# Patient Record
Sex: Male | Born: 1965
Health system: Southern US, Community
[De-identification: ages and names within clinical notes are randomized; demographics above are authoritative.]

## PROBLEM LIST (undated history)

## (undated) DIAGNOSIS — N4400 Torsion of testis, unspecified: Secondary | ICD-10-CM

## (undated) DIAGNOSIS — E669 Obesity, unspecified: Secondary | ICD-10-CM

## (undated) DIAGNOSIS — E11610 Type 2 diabetes mellitus with diabetic neuropathic arthropathy: Secondary | ICD-10-CM

## (undated) DIAGNOSIS — I10 Essential (primary) hypertension: Secondary | ICD-10-CM

## (undated) HISTORY — DX: Obesity, unspecified: E66.9

## (undated) HISTORY — DX: Essential (primary) hypertension: I10

## (undated) HISTORY — DX: Torsion of testis, unspecified: N44.00

## (undated) HISTORY — DX: Type 2 diabetes mellitus with diabetic neuropathic arthropathy: E11.610

---

## 2003-03-17 ENCOUNTER — Emergency Department (HOSPITAL_COMMUNITY): Admission: AD | Admit: 2003-03-17 | Discharge: 2003-03-17 | Payer: Self-pay | Admitting: Family Medicine

## 2004-02-12 ENCOUNTER — Ambulatory Visit: Payer: Self-pay | Admitting: Family Medicine

## 2005-01-02 ENCOUNTER — Ambulatory Visit: Payer: Self-pay | Admitting: Internal Medicine

## 2014-06-06 ENCOUNTER — Emergency Department (INDEPENDENT_AMBULATORY_CARE_PROVIDER_SITE_OTHER): Payer: BLUE CROSS/BLUE SHIELD

## 2014-06-06 ENCOUNTER — Emergency Department (HOSPITAL_COMMUNITY)
Admission: EM | Admit: 2014-06-06 | Discharge: 2014-06-06 | Disposition: A | Discharge status: Home / Self Care | Payer: BLUE CROSS/BLUE SHIELD | Source: Home / Self Care | Attending: Family Medicine | Admitting: Family Medicine

## 2014-06-06 ENCOUNTER — Encounter (HOSPITAL_COMMUNITY): Payer: Self-pay | Admitting: *Deleted

## 2014-06-06 DIAGNOSIS — R141 Gas pain: Secondary | ICD-10-CM

## 2014-06-06 DIAGNOSIS — M419 Scoliosis, unspecified: Secondary | ICD-10-CM

## 2014-06-06 DIAGNOSIS — M5136 Other intervertebral disc degeneration, lumbar region: Secondary | ICD-10-CM

## 2014-06-06 DIAGNOSIS — K5901 Slow transit constipation: Secondary | ICD-10-CM

## 2014-06-06 NOTE — ED Provider Notes (Signed)
CSN: [id number]     Arrival date & time 06/06/14  1259 History   First MD Initiated Contact with Patient 06/06/14 1400     Chief Complaint  Patient presents with  . Abdominal Pain   (Consider location/radiation/quality/duration/timing/severity/associated sxs/prior Treatment) HPI Comments: Nursing note reviewed. Same history obtained by the patient. That approximately 2 weeks ago he was lifting a heavy object and he also coughed in experience some pain to the right upper quadrant. Since that time his wife noticed that there was a swelling to the right upper quadrant. He did not notice it initially. He has minimal discomfort within this area. He has little to no pain. 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 He denies midabdominal pain, nausea, vomiting, decrease in appetite, constipation, diarrhea, blood in the stool. He has not felt ill or experienced fever. He apparently saw a physician associated with the company and was told it may be hernia according to the patient.   History reviewed. No pertinent past medical history. History reviewed. No pertinent past surgical history. History reviewed. No pertinent family history. History  Substance Use Topics  . Smoking status: Never Smoker   . Smokeless tobacco: Not on file  . Alcohol Use: No    Review of Systems  Constitutional: Negative for fever, activity change, appetite change and fatigue.  HENT: Negative.   Respiratory: Negative for cough, shortness of breath and wheezing.        No pain with deep breathing.  Cardiovascular: Negative for chest pain and palpitations.  Gastrointestinal: Negative for nausea, vomiting, abdominal pain, diarrhea, constipation, blood in stool, abdominal distention and rectal pain.  Genitourinary: Negative.   Skin: Negative.   Neurological: Negative.   Psychiatric/Behavioral: Negative.     Allergies  Ceclor  Home Medications   Prior to  Admission medications   Not on File   BP 176/93 mmHg  Pulse 88  Temp(Src) 98.7 F (37.1 C) (Oral)  Resp 16  SpO2 100% Physical Exam  Constitutional: He is oriented to person, place, and time. He appears well-developed and well-nourished. No distress.  Eyes: EOM are normal.  Neck: Normal range of motion. Neck supple.  Cardiovascular: Normal rate, regular rhythm and normal heart sounds.   Pulmonary/Chest: Effort normal and breath sounds normal. He has no wheezes. He has no rales.  Abdominal: Soft. Bowel sounds are normal. He exhibits mass. He exhibits no distension, no pulsatile liver, no fluid wave and no abdominal bruit. There is no hepatosplenomegaly. There is no tenderness. There is no rigidity, no rebound, no guarding, no tenderness at McBurney's point and negative Murphy's sign. No hernia.    There is a 16 x 8 cm area of what appears to be a fatty prominence lying partially over the right anterior costal margin and right upper quadrant of the abdomen. There is no tenderness. It is slightly firm just enough so that it can be delineated from the soft abdomen. It is nonmobile. It does not reduce in such a way hernia may be expected to. No palpable muscular wall ring. Palpation of the abdomen reveals no tenderness or masses, rebound or guarding. There is no overlying discoloration, ecchymosis. No tenderness to deep palpation and percussion to the chest wall, ribs or costal margin. Having the patient flex at the waist laterally produces very minor discomfort only in such a way that is " barely noticeable "according to the patient.  Neurological: He is alert and oriented to person,  place, and time. He exhibits normal muscle tone.  Skin: Skin is warm and dry. No rash noted. No erythema.  Psychiatric: He has a normal mood and affect.  Nursing note and vitals reviewed.   ED Course  Procedures (including critical care time) Labs Review Labs Reviewed - No data to display  Imaging Review Dg  Abd 2 Views  06/06/2014   CLINICAL DATA:  Lifting injury.  EXAM: ABDOMEN - 2 VIEW  COMPARISON:  None.  FINDINGS: Soft tissue structures are unremarkable. Gas pattern is nonspecific. Stool noted throughout colon. Severe degenerative changes lumbar spinal scoliosis concave left. Right posterior ninth rib fracture noted. This appears old. No displaced acute fracture noted.  IMPRESSION: 1. Moderate amount of stool noted throughout colon. No bowel distention.  2. Severe degenerative changes and scoliosis lumbar spine. Old right posterior ninth rib fracture.   Electronically Signed   By: Maisie Fushomas  Register   On: 06/06/2014 16:02     MDM   1. Constipation due to slow transit   2. Abdominal gas pain   3. Lumbar degenerative disc disease   4. Scoliosis deformity of spine    No hernias seen. The x-ray is strongly suggest that this bulges due to heavy stool burden to the ascending colon positioned laterally causing the apparent bulge. Much stool in the colon and rectum. Miralax 1 and 1/2 cap per 8 oz fluid every 1-2 hours times 2-3 glasses. Dulcolax 1 tablet Fleets enema to empty rectum. There is also lumbar degenerative disc disease for which she needs to follow-up with his PCP. Call tomorrow for an appointment.    Hayden Rasmussenavid Tyreanna Bisesi, NP 06/06/14 [phone]

## 2014-06-06 NOTE — ED Notes (Addendum)
Pt  alledges he   Sustained  An  Injury  At  Work   For  [employer]  While  Lifting approx  2  [employer]eks  Ago      He  Reports  Pain and  Swelling    Of  His  r  Side      He  denys  Any  Vomiting    He  States  He  Notified  His  Employer    And   Was seen  At  An  Urgent  Care  [address]  He  Does  Not  Recall  The  Name  And  He  Brought  No papers  With  Him    He  States  He  Was  Told  He  May  Have  A  Hernia    According  To  Him   -   The  Pt  Is  Sanding  Upright in  Room  Appearing  In no  Severe   Distress        Skin is  Warm  And  Dry  He  Is  Alert and  Oriented  Pt  States  He  Wants  A  Second  opinion

## 2014-06-06 NOTE — Discharge Instructions (Signed)
Constipation Miralax 1 and 1/2 cap per 8 oz fluid every 1-2 hours times 2-3 glasses. Dulcolax 1 tablet Fleets enema to empty colon.  Constipation is when a person has fewer than three bowel movements a week, has difficulty having a bowel movement, or has stools that are dry, hard, or larger than normal. As people grow older, constipation is more common. If you try to fix constipation with medicines that make you have a bowel movement (laxatives), the problem may get worse. Long-term laxative use may cause the muscles of the colon to become weak. A low-fiber diet, not taking in enough fluids, and taking certain medicines may make constipation worse.  CAUSES   Certain medicines, such as antidepressants, pain medicine, iron supplements, antacids, and water pills.   Certain diseases, such as diabetes, irritable bowel syndrome (IBS), thyroid disease, or depression.   Not drinking enough water.   Not eating enough fiber-rich foods.   Stress or travel.   Lack of physical activity or exercise.   Ignoring the urge to have a bowel movement.   Using laxatives too much.  SIGNS AND SYMPTOMS   Having fewer than three bowel movements a week.   Straining to have a bowel movement.   Having stools that are hard, dry, or larger than normal.   Feeling full or bloated.   Pain in the lower abdomen.   Not feeling relief after having a bowel movement.  DIAGNOSIS  Your health care provider will take a medical history and perform a physical exam. Further testing may be done for severe constipation. Some tests may include:  A barium enema X-ray to examine your rectum, colon, and, sometimes, your small intestine.   A sigmoidoscopy to examine your lower colon.   A colonoscopy to examine your entire colon. TREATMENT  Treatment will depend on the severity of your constipation and what is causing it. Some dietary treatments include drinking more fluids and eating more fiber-rich foods.  Lifestyle treatments may include regular exercise. If these diet and lifestyle recommendations do not help, your health care provider may recommend taking over-the-counter laxative medicines to help you have bowel movements. Prescription medicines may be prescribed if over-the-counter medicines do not work.  HOME CARE INSTRUCTIONS   Eat foods that have a lot of fiber, such as fruits, vegetables, whole grains, and beans.  Limit foods high in fat and processed sugars, such as french fries, hamburgers, cookies, candies, and soda.   A fiber supplement may be added to your diet if you cannot get enough fiber from foods.   Drink enough fluids to keep your urine clear or pale yellow.   Exercise regularly or as directed by your health care provider.   Go to the restroom when you have the urge to go. Do not hold it.   Only take over-the-counter or prescription medicines as directed by your health care provider. Do not take other medicines for constipation without talking to your health care provider first.  SEEK IMMEDIATE MEDICAL CARE IF:   You have bright red blood in your stool.   Your constipation lasts for more than 4 days or gets worse.   You have abdominal or rectal pain.   You have thin, pencil-like stools.   You have unexplained weight loss. MAKE SURE YOU:   Understand these instructions.  Will watch your condition.  Will get help right away if you are not doing well or get worse. Document Released: 10/04/2003 Document Revised: 01/10/2013 Document Reviewed: 10/17/2012 ExitCare Patient Information 2015  [employer]. This information is not intended to replace advice given to you by your health care provider. Make sure you discuss any questions you have with your health care provider.  Degenerative Disk Disease Degenerative disk disease is a condition caused by the changes that occur in the cushions of the backbone (spinal disks) as you grow older. Spinal disks are  soft and compressible disks located between the bones of the spine (vertebrae). They act like shock absorbers. Degenerative disk disease can affect the whole spine. However, the neck and lower back are most commonly affected. Many changes can occur in the spinal disks with aging, such as:  The spinal disks may dry and shrink.  Small tears may occur in the tough, outer covering of the disk (annulus).  The disk space may become smaller due to loss of water.  Abnormal growths in the bone (spurs) may occur. This can put pressure on the nerve roots exiting the spinal canal, causing pain.  The spinal canal may become narrowed. CAUSES  Degenerative disk disease is a condition caused by the changes that occur in the spinal disks with aging. The exact cause is not known, but there is a genetic basis for many patients. Degenerative changes can occur due to loss of fluid in the disk. This makes the disk thinner and reduces the space between the backbones. Small cracks can develop in the outer layer of the disk. This can lead to the breakdown of the disk. You are more likely to get degenerative disk disease if you are overweight. Smoking cigarettes and doing heavy work such as weightlifting can also increase your risk of this condition. Degenerative changes can start after a sudden injury. Growth of bone spurs can compress the nerve roots and cause pain.  SYMPTOMS  The symptoms vary from person to person. Some people may have no pain, while others have severe pain. The pain may be so severe that it can limit your activities. The location of the pain depends on the part of your backbone that is affected. You will have neck or arm pain if a disk in the neck area is affected. You will have pain in your back, buttocks, or legs if a disk in the lower back is affected. The pain becomes worse while bending, reaching up, or with twisting movements. The pain may start gradually and then get worse as time passes. It may  also start after a major or minor injury. You may feel numbness or tingling in the arms or legs.  DIAGNOSIS  Your caregiver will ask you about your symptoms and about activities or habits that may cause the pain. He or she may also ask about any injuries, diseases, or treatments you have had earlier. Your caregiver will examine you to check for the range of movement that is possible in the affected area, to check for strength in your extremities, and to check for sensation in the areas of the arms and legs supplied by different nerve roots. An X-ray of the spine may be taken. Your caregiver may suggest other imaging tests, such as magnetic resonance imaging (MRI), if needed.  TREATMENT  Treatment includes rest, modifying your activities, and applying ice and heat. Your caregiver may prescribe medicines to reduce your pain and may ask you to do some exercises to strengthen your back. In some cases, you may need surgery. You and your caregiver will decide on the treatment that is best for you. HOME CARE INSTRUCTIONS   Follow proper lifting  and walking techniques as advised by your caregiver.  Maintain good posture.  Exercise regularly as advised.  Perform relaxation exercises.  Change your sitting, standing, and sleeping habits as advised. Change positions frequently.  Lose weight as advised.  Stop smoking if you smoke.  Wear supportive footwear. SEEK MEDICAL CARE IF:  Your pain does not go away within 1 to 4 weeks. SEEK IMMEDIATE MEDICAL CARE IF:   Your pain is severe.  You notice weakness in your arms, hands, or legs.  You begin to lose control of your bladder or bowel movements. MAKE SURE YOU:   Understand these instructions.  Will watch your condition.  Will get help right away if you are not doing well or get worse. Document Released: 11/02/2006 Document Revised: 03/30/2011 Document Reviewed: 05/09/2013 [facility] Patient Information 2015 [place], [place]. This information is  not intended to replace advice given to you by your health care provider. Make sure you discuss any questions you have with your health care provider.  Scoliosis Scoliosis is the name given to a spine that curves sideways.Scoliosis can cause twisting of your shoulders, hips, chest, back, and rib cage.  CAUSES  The cause of scoliosis is not always known. It may be caused by a birth defect or by a disease that can cause muscular dysfunction and imbalance, such as cerebral palsy and muscular dystrophy.  RISK FACTORS Having a disease that causes muscle disease or dysfunction. SIGNS AND SYMPTOMS Scoliosis often has no signs or symptoms.If they are present, they may include:  Unequal size of one body side compared to the other (asymmetry).  Visible curvature of the spine.  Pain. The pain may limit physical activity.  Shortness of breath.  Bowel or bladder issues. DIAGNOSIS A skilled health care provider will perform an evaluation. This will involve:  Taking your history.  Performing a physical examination.  Performing a neurological exam to detect nerve or muscle function loss.  Range of motion studies on the spine.  X-rays. An MRI may also be obtained. TREATMENT  Treatment varies depending on the nature, extent, and severity of the disease. If the curvature is not great, you may need only observation. A brace may be used to prevent scoliosis from progressing. A brace may also be needed during growth spurts. Physical therapy may be of benefit. Surgery may be required.  HOME CARE INSTRUCTIONS   Your health care provider may suggest exercises to strengthen your muscles. Perform them as directed.  Ask your health care provider before participating in any sports.   If you have been prescribed an orthopedic brace, wear it as instructed by your health care provider. SEEK MEDICAL CARE IF: Your brace causes the skin to become sore (chafe) or is uncomfortable.  SEEK IMMEDIATE MEDICAL  CARE IF:  You have back pain that is not relieved by the medicines prescribed by your health care provider.   Your legs feel weak or you lose function in your legs.  You lose some bowel or bladder control.  Document Released: 01/03/2000 Document Revised: 01/10/2013 Document Reviewed: 09/12/2012 [facility] Patient Information 2015 [place], [place]. This information is not intended to replace advice given to you by your health care provider. Make sure you discuss any questions you have with your health care provider.

## 2014-06-12 ENCOUNTER — Ambulatory Visit (INDEPENDENT_AMBULATORY_CARE_PROVIDER_SITE_OTHER): Payer: BLUE CROSS/BLUE SHIELD | Admitting: Primary Care

## 2014-06-12 ENCOUNTER — Telehealth: Payer: Self-pay | Admitting: Primary Care

## 2014-06-12 ENCOUNTER — Encounter: Payer: Self-pay | Admitting: Primary Care

## 2014-06-12 VITALS — BP 154/106 | HR 83 | Temp 97.7°F | Ht 74.0 in | Wt 307.4 lb

## 2014-06-12 DIAGNOSIS — E118 Type 2 diabetes mellitus with unspecified complications: Secondary | ICD-10-CM

## 2014-06-12 DIAGNOSIS — R200 Anesthesia of skin: Secondary | ICD-10-CM

## 2014-06-12 DIAGNOSIS — E669 Obesity, unspecified: Secondary | ICD-10-CM | POA: Diagnosis not present

## 2014-06-12 DIAGNOSIS — I10 Essential (primary) hypertension: Secondary | ICD-10-CM | POA: Diagnosis not present

## 2014-06-12 DIAGNOSIS — R208 Other disturbances of skin sensation: Secondary | ICD-10-CM

## 2014-06-12 LAB — COMPREHENSIVE METABOLIC PANEL
ALT: 53 U/L (ref 0–53)
AST: 23 U/L (ref 0–37)
Albumin: 4.2 g/dL (ref 3.5–5.2)
Alkaline Phosphatase: 71 U/L (ref 39–117)
BUN: 23 mg/dL (ref 6–23)
CO2: 28 meq/L (ref 19–32)
Calcium: 9.4 mg/dL (ref 8.4–10.5)
Chloride: 97 mEq/L (ref 96–112)
Creatinine, Ser: 0.93 mg/dL (ref 0.40–1.50)
GFR: 92.06 mL/min (ref >60.00)
Glucose, Bld: 378 mg/dL — ABNORMAL HIGH (ref 70–99)
POTASSIUM: 4.6 meq/L (ref 3.5–5.1)
Sodium: 132 mEq/L — ABNORMAL LOW (ref 135–145)
TOTAL PROTEIN: 7.1 g/dL (ref 6.0–8.3)
Total Bilirubin: 0.8 mg/dL (ref 0.2–1.2)

## 2014-06-12 LAB — HEMOGLOBIN A1C: HEMOGLOBIN A1C: 11.3 % — AB (ref 4.6–6.5)

## 2014-06-12 MED ORDER — LISINOPRIL 10 MG PO TABS: 10.0000 mg | ORAL_TABLET | Freq: Every day | ORAL | Status: DC

## 2014-06-12 MED ORDER — METFORMIN HCL 500 MG PO TABS: 500.0000 mg | ORAL_TABLET | Freq: Two times a day (BID) | ORAL | Status: DC

## 2014-06-12 NOTE — Progress Notes (Signed)
Subjective:    Patient ID: Kenneth Cohen, male    DOB: [birth year], 49 y.o.   MRN: [id number]  HPI  Kenneth Cohen is a 49 year old male who presents today to establish care and discuss the problems mentioned below. Will review records from Urgent Care visit last week.  1) Elevated blood pressure: History of elevated readings 10 years ago when eating a poor diet consisting of fast food and also smoking. He was placed on medication which he was able to come off of due to improvement in his blood pressure. His blood pressure was 176/93 at Urgent Care last week.   BP Readings from Last 3 Encounters:  06/12/14 154/106  06/06/14 176/93     2) Constipation: He was evaluated on 06/06/14 at Urgent Care near [facility] for RUQ abdominal pain and was determined to have a moderate amount of stool in his colon. He's been taking ducolax, Mirilax, and had enema with consistent bowel movements since.  3) Degenerative Spine Disease/Numbness: Noted on abdominal xray obtained last week. History of heavy lifting at prior occupation which caused back pain. Denies low back pain today but will have some "sharp, shooting" pain intermittently down right leg. Reports numbness to bilateral feet for several months. He noticed bleeding on his kitchen floor last week and realized later he was bleeding as he'd stepped on some glass.  4) Obesity: Diet consists of hamburgers, steaks, pork chops, sandwiches, some fast food. Drinks mostly diet pepsi, water, crystal light, some sweet tea.  He tries to watch the salt content of his foods. He does not exercise regularly.   Review of Systems  Constitutional: Negative for fatigue and unexpected weight change.  HENT: Negative for rhinorrhea.   Eyes: Negative for visual disturbance.  Respiratory: Negative for cough and shortness of breath.   Cardiovascular: Negative for chest pain.  Gastrointestinal: Positive for constipation. Negative for diarrhea.  Genitourinary: Negative for  dysuria and difficulty urinating.  Musculoskeletal: Positive for arthralgias. Negative for myalgias.  Skin: Negative for rash.  Allergic/Immunologic:       Occasoinal seasonal allergies. Once had allergy injections.  Neurological: Positive for numbness. Negative for dizziness and headaches.  Psychiatric/Behavioral:       Denies concerns for anxiety or depression.       Past Medical History  Diagnosis Date  . Hypertension   . Obesity   . Testicular torsion     as a child    History   Social History  . Marital Status: Married    Spouse Name: N/A  . Number of Children: N/A  . Years of Education: N/A   Occupational History  . Not on file.   Social History Main Topics  . Smoking status: Former Smoker    Quit date: 01/20/2004  . Smokeless tobacco: Not on file  . Alcohol Use: 0.0 oz/week    0 Standard drinks or equivalent per week     Comment: rarely  . Drug Use: No  . Sexual Activity: Not on file   Other Topics Concern  . Not on file   Social History Narrative   Married.   One son, junior in [employer].   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   Enjoys playing on the computer.    No past surgical history on file.  Family History  Problem Relation Age of Onset  . Hypertension Mother   . Diabetes Father   . Diabetes Mother     Allergies  Allergen Reactions  .  Ceclor [Cefaclor] Swelling    No current outpatient prescriptions on file prior to visit.   No current facility-administered medications on file prior to visit.    BP 154/106 mmHg  Pulse 83  Temp(Src) 97.7 F (36.5 C) (Oral)  Ht 6\' 2"  (1.88 m)  Wt 307 lb 6.4 oz (139.436 kg)  BMI 39.45 kg/m2  SpO2 97%     Objective:   Physical Exam  Constitutional: He is oriented to person, place, and time. He appears well-nourished.  HENT:  Right Ear: Tympanic membrane and ear canal normal.  Left Ear: Tympanic membrane and ear canal normal.  Nose: Nose normal.  Mouth/Throat: Oropharynx is clear and  moist.  Eyes: Conjunctivae and EOM are normal. Pupils are equal, round, and reactive to light.  Neck: Neck supple.  Cardiovascular: Normal rate and regular rhythm.   Pulmonary/Chest: Effort normal and breath sounds normal.  Abdominal: Soft. Bowel sounds are normal. He exhibits no mass. There is no tenderness.  Neurological: He is alert and oriented to person, place, and time. No cranial nerve deficit.  Skin: Skin is warm and dry.  Psychiatric: He has a normal mood and affect.          Assessment & Plan:

## 2014-06-12 NOTE — Assessment & Plan Note (Signed)
Poor diet. No exercise.  Also with numbness to bilateral feet.  Will check A1C, CMP today to evaluate for diabetes as I suspect this is probable.

## 2014-06-12 NOTE — Patient Instructions (Signed)
Start Lisinopril tablets for blood pressure. Take 1 tablet by mouth daily for high blood pressure. Start checking your blood pressure daily if possible. Complete lab work prior to leaving today. I will notify you of your results. It is important that you improve your diet. Please limit carbohydrates in the form of white bread, rice, pasta, cakes, cookies, sugary drinks, etc. Increase your consumption of fresh fruits and vegetables. Be sure to drink plenty of water daily. Follow up in 2 weeks for evaluation of blood pressure. It was a pleasure to meet you today! Please don't hesitate to call me with any questions. Welcome to [facility]!  Hypertension Hypertension, commonly called high blood pressure, is when the force of blood pumping through your arteries is too strong. Your arteries are the blood vessels that carry blood from your heart throughout your body. A blood pressure reading consists of a higher number over a lower number, such as 110/72. The higher number (systolic) is the pressure inside your arteries when your heart pumps. The lower number (diastolic) is the pressure inside your arteries when your heart relaxes. Ideally you want your blood pressure below 120/80. Hypertension forces your heart to work harder to pump blood. Your arteries may become narrow or stiff. Having hypertension puts you at risk for heart disease, stroke, and other problems.  RISK FACTORS Some risk factors for high blood pressure are controllable. Others are not.  Risk factors you cannot control include:   Race. You may be at higher risk if you are African American.  Age. Risk increases with age.  Gender. Men are at higher risk than women before age 48 years. After age 48, women are at higher risk than men. Risk factors you can control include:  Not getting enough exercise or physical activity.  Being overweight.  Getting too much fat, sugar, calories, or salt in your diet.  Drinking too much alcohol. SIGNS  AND SYMPTOMS Hypertension does not usually cause signs or symptoms. Extremely high blood pressure (hypertensive crisis) may cause headache, anxiety, shortness of breath, and nosebleed. DIAGNOSIS  To check if you have hypertension, your health care provider will measure your blood pressure while you are seated, with your arm held at the level of your heart. It should be measured at least twice using the same arm. Certain conditions can cause a difference in blood pressure between your right and left arms. A blood pressure reading that is higher than normal on one occasion does not mean that you need treatment. If one blood pressure reading is high, ask your health care provider about having it checked again. TREATMENT  Treating high blood pressure includes making lifestyle changes and possibly taking medicine. Living a healthy lifestyle can help lower high blood pressure. You may need to change some of your habits. Lifestyle changes may include:  Following the DASH diet. This diet is high in fruits, vegetables, and whole grains. It is low in salt, red meat, and added sugars.  Getting at least 2 hours of brisk physical activity every week.  Losing weight if necessary.  Not smoking.  Limiting alcoholic beverages.  Learning ways to reduce stress. If lifestyle changes are not enough to get your blood pressure under control, your health care provider may prescribe medicine. You may need to take more than one. Work closely with your health care provider to understand the risks and benefits. HOME CARE INSTRUCTIONS  Have your blood pressure rechecked as directed by your health care provider.   Take medicines only as  directed by your health care provider. Follow the directions carefully. Blood pressure medicines must be taken as prescribed. The medicine does not work as well when you skip doses. Skipping doses also puts you at risk for problems.   Do not smoke.   Monitor your blood pressure at  home as directed by your health care provider. SEEK MEDICAL CARE IF:   You think you are having a reaction to medicines taken.  You have recurrent headaches or feel dizzy.  You have swelling in your ankles.  You have trouble with your vision. SEEK IMMEDIATE MEDICAL CARE IF:  You develop a severe headache or confusion.  You have unusual weakness, numbness, or feel faint.  You have severe chest or abdominal pain.  You vomit repeatedly.  You have trouble breathing. MAKE SURE YOU:   Understand these instructions.  Will watch your condition.  Will get help right away if you are not doing well or get worse. Document Released: 01/05/2005 Document Revised: 05/22/2013 Document Reviewed: 10/28/2012 [facility] Patient Information 2015 [place], [place]. This information is not intended to replace advice given to you by your health care provider. Make sure you discuss any questions you have with your health care provider.

## 2014-06-12 NOTE — Progress Notes (Signed)
Pre visit review using our clinic review tool, if applicable. No additional management support is needed unless otherwise documented below in the visit note. 

## 2014-06-12 NOTE — Assessment & Plan Note (Signed)
Numbness to bilateral feet for several months. Suspected diabetes due to body habitus, poor diet, and numbness to feet. A1C of 11. Will start metformin BID. Patient started on ACE this morning for HTN. Follow up in 2 weeks for HTN, 3 months for diabetes.

## 2014-06-12 NOTE — Telephone Encounter (Signed)
A1C of 11.3. Spoke with patient regarding result and ordered metformin BID. Patient is to follow up with me in 2 weeks as he is under me since re-establishing. Kenneth Cohen, will you call him tomorrow and get him on my schedule for 2 weeks from today (Tuesday)? Also, will you have the PCP changed to my name?  Thanks.

## 2014-06-12 NOTE — Assessment & Plan Note (Signed)
Elevated today and at Urgent Care last week. Start Lisinopril 10 mg daily.  CMP today. Educated on side effects. Follow up in 2 weeks for re-evaluation

## 2014-06-13 NOTE — Telephone Encounter (Signed)
Called patient and schedule follow up on 06/26/14

## 2014-06-14 ENCOUNTER — Telehealth: Payer: Self-pay | Admitting: Primary Care

## 2014-06-14 ENCOUNTER — Encounter: Payer: Self-pay | Admitting: Primary Care

## 2014-06-14 NOTE — Telephone Encounter (Signed)
Pt came in stating he needs a note releasing his restrictions at work that he was put on. He says he was wrongly diagnosed and put on restrictions by the companies urgent care. He said the note needed to come from his pcp. His employer will not allow him to come off the restrictions without the release. He can be reached at [phone] for further questions.

## 2014-06-14 NOTE — Telephone Encounter (Signed)
Form completed and is ready for pickup. Patient aware.

## 2014-06-26 ENCOUNTER — Encounter: Payer: Self-pay | Admitting: Primary Care

## 2014-06-26 ENCOUNTER — Ambulatory Visit: Payer: BLUE CROSS/BLUE SHIELD | Admitting: Family Medicine

## 2014-06-26 ENCOUNTER — Ambulatory Visit (INDEPENDENT_AMBULATORY_CARE_PROVIDER_SITE_OTHER): Payer: BLUE CROSS/BLUE SHIELD | Admitting: Primary Care

## 2014-06-26 VITALS — BP 142/94 | HR 82 | Temp 97.7°F | Ht 74.0 in | Wt 311.8 lb

## 2014-06-26 DIAGNOSIS — N529 Male erectile dysfunction, unspecified: Secondary | ICD-10-CM

## 2014-06-26 DIAGNOSIS — E119 Type 2 diabetes mellitus without complications: Secondary | ICD-10-CM | POA: Diagnosis not present

## 2014-06-26 DIAGNOSIS — T24001A Burn of unspecified degree of unspecified site of right lower limb, except ankle and foot, initial encounter: Secondary | ICD-10-CM

## 2014-06-26 DIAGNOSIS — Z23 Encounter for immunization: Secondary | ICD-10-CM | POA: Diagnosis not present

## 2014-06-26 DIAGNOSIS — I10 Essential (primary) hypertension: Secondary | ICD-10-CM

## 2014-06-26 DIAGNOSIS — F5221 Male erectile disorder: Secondary | ICD-10-CM

## 2014-06-26 DIAGNOSIS — E118 Type 2 diabetes mellitus with unspecified complications: Secondary | ICD-10-CM

## 2014-06-26 LAB — MICROALBUMIN / CREATININE URINE RATIO
Creatinine,U: 83.8 mg/dL
MICROALB UR: 1.2 mg/dL (ref 0.0–1.9)
MICROALB/CREAT RATIO: 1.4 mg/g (ref 0.0–30.0)

## 2014-06-26 MED ORDER — DOXYCYCLINE HYCLATE 100 MG PO TABS: 100.0000 mg | ORAL_TABLET | Freq: Two times a day (BID) | ORAL | Status: DC

## 2014-06-26 MED ORDER — SILVER SULFADIAZINE 1 % EX CREA: 1.0000 "application " | TOPICAL_CREAM | Freq: Every day | CUTANEOUS | Status: DC

## 2014-06-26 MED ORDER — LISINOPRIL 20 MG PO TABS: 20.0000 mg | ORAL_TABLET | Freq: Every day | ORAL | Status: DC

## 2014-06-26 MED ORDER — SILDENAFIL CITRATE 25 MG PO TABS: ORAL_TABLET | ORAL | Status: DC

## 2014-06-26 NOTE — Assessment & Plan Note (Signed)
Checking blood sugars at home and getting low to mid 200's. Increased Metformin to 1000 mg BID. Encouraged healthy diet as he is working to improve. Recheck A1C at the end of August during physical. Urine microalbumin today.
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
## 2014-06-26 NOTE — Progress Notes (Signed)
Pre visit review using our clinic review tool, if applicable. No additional management support is needed unless otherwise documented below in the visit note. 

## 2014-06-26 NOTE — Patient Instructions (Signed)
Start Lisinopril 20 mg tablets, they have been sent to your pharmacy. You may take 2 of the 10 mg tablets until you've finished your packet.  Complete urine test in the lab prior to leaving today. I will notify you of your results.  You may use Viagra tablets for difficulty maintaining an erection. Take 1 tablet by mouth 30 minutes to 1 hour prior to intercourse.  Start Doxycycline antibiotic tablets. Take 1 tablet by mouth twice daily for 10 days.  Apply the burn cream daily to your wound twice daily. Keep wound covered with the cream. Protect your wound by applying a dressing.  Please schedule a physical with me at the end of August or beginning of September. You will also schedule a lab only appointment after August 24th. We will discuss your lab results during your physical.  Follow up in 1 month for evaluation of blood pressure.

## 2014-06-26 NOTE — Assessment & Plan Note (Signed)
Improved, but not at goal of less than 140/90. Increase lisinopril to 20 mg daily. Potassium WNL last visit. Continue checking BP at home. Follow up in one month

## 2014-06-26 NOTE — Addendum Note (Signed)
Addended by: Baldomero LamyHAVERS, NATASHA C on: 06/26/2014 03:40 PM   Modules accepted: Kipp BroodSmartSet

## 2014-06-28 ENCOUNTER — Telehealth: Payer: Self-pay

## 2014-06-28 DIAGNOSIS — N529 Male erectile dysfunction, unspecified: Secondary | ICD-10-CM

## 2014-06-28 MED ORDER — SILDENAFIL CITRATE 20 MG PO TABS: ORAL_TABLET | ORAL | Status: DC

## 2014-06-28 NOTE — Telephone Encounter (Signed)
Pt said viagra is too expensive and pt was talking with pharmacist at [facility] [address] and was advised to get lower dose of generic viagra sent to [facility] [address]. Pt is going out of town next week and is picking up other meds today at [facility] [address] and request generic viagra in lower dose sent to pharmacy today. Pt request cb when done.

## 2014-06-28 NOTE — Telephone Encounter (Signed)
Sent Sidenafil 20 mg tablets to his pharmacy. Johny Drilling, will you please notify patient of this change? Thanks.

## 2014-06-28 NOTE — Telephone Encounter (Signed)
Spoke with Neysa Bonito at [facility] [address]. Pharmacist that is on duty now did not speak with pt but said sildenafil 20 mg # 30 is $95.40 cost to pt compared to viagra 25 mg # 10 cost to pt $547.00.

## 2014-06-28 NOTE — Telephone Encounter (Signed)
Called and notified patient of Kate's comments. Patient verbalized understanding.  

## 2014-08-02 ENCOUNTER — Encounter: Payer: Self-pay | Admitting: Primary Care

## 2014-08-02 ENCOUNTER — Ambulatory Visit (INDEPENDENT_AMBULATORY_CARE_PROVIDER_SITE_OTHER): Payer: BLUE CROSS/BLUE SHIELD | Admitting: Primary Care

## 2014-08-02 VITALS — BP 124/86 | HR 84 | Temp 98.3°F | Ht 74.0 in | Wt 305.8 lb

## 2014-08-02 DIAGNOSIS — I10 Essential (primary) hypertension: Secondary | ICD-10-CM | POA: Diagnosis not present

## 2014-08-02 DIAGNOSIS — R252 Cramp and spasm: Secondary | ICD-10-CM

## 2014-08-02 DIAGNOSIS — E118 Type 2 diabetes mellitus with unspecified complications: Secondary | ICD-10-CM

## 2014-08-02 DIAGNOSIS — L03115 Cellulitis of right lower limb: Secondary | ICD-10-CM

## 2014-08-02 LAB — BASIC METABOLIC PANEL
BUN: 19 mg/dL (ref 6–23)
CO2: 26 mEq/L (ref 19–32)
Calcium: 9.2 mg/dL (ref 8.4–10.5)
Chloride: 100 mEq/L (ref 96–112)
Creatinine, Ser: 0.83 mg/dL (ref 0.40–1.50)
GFR: 104.92 mL/min (ref >60.00)
Glucose, Bld: 320 mg/dL — ABNORMAL HIGH (ref 70–99)
Potassium: 4.1 mEq/L (ref 3.5–5.1)
SODIUM: 135 meq/L (ref 135–145)

## 2014-08-02 MED ORDER — CLINDAMYCIN HCL 300 MG PO CAPS: 300.0000 mg | ORAL_CAPSULE | Freq: Three times a day (TID) | ORAL | Status: DC

## 2014-08-02 NOTE — Assessment & Plan Note (Signed)
Improved and at goal with increase of lisinopril to 20 mg.  Reports cough that developed since increase from 10 to 20. If present next visit, will change to ARB.

## 2014-08-02 NOTE — Patient Instructions (Addendum)
Continue to work to improve your diet. Limit carbohydrates in the form of white bread, rice, pasta, cakes, cookies, sugary drinks, etc. Increase your consumption of fresh fruits and vegetables. Be sure to drink plenty of water daily.  Complete lab work prior to leaving today. I will notify you of your results.  Follow up the week of August 29th for repeat of your diabetes test. Call me if you continue to experience a cough.  It was nice to see you!

## 2014-08-02 NOTE — Assessment & Plan Note (Addendum)
Not checking blood sugars at home. Last visit increased Metformin to 1000 mg BID. A1C check in August. Currently on ACE for renal protection. BMP today.
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
## 2014-08-02 NOTE — Progress Notes (Signed)
Pre visit review using our clinic review tool, if applicable. No additional management support is needed unless otherwise documented below in the visit note. 

## 2014-08-17 ENCOUNTER — Other Ambulatory Visit: Payer: Self-pay | Admitting: Primary Care

## 2014-08-17 DIAGNOSIS — N529 Male erectile dysfunction, unspecified: Secondary | ICD-10-CM

## 2014-08-17 MED ORDER — SILDENAFIL CITRATE 20 MG PO TABS: ORAL_TABLET | ORAL | Status: DC

## 2014-09-04 ENCOUNTER — Telehealth: Payer: Self-pay | Admitting: Primary Care

## 2014-09-04 DIAGNOSIS — E118 Type 2 diabetes mellitus with unspecified complications: Secondary | ICD-10-CM

## 2014-09-04 MED ORDER — METFORMIN HCL 500 MG PO TABS: 1000.0000 mg | ORAL_TABLET | Freq: Two times a day (BID) | ORAL | Status: DC

## 2014-09-04 NOTE — Telephone Encounter (Signed)
New script sent into [facility]. Thanks.

## 2014-09-04 NOTE — Telephone Encounter (Signed)
Received refill fax request from [facility] on [address]  Pharmacy stated Patient need new Rx-was told to take 2 tablets twice daily.  Last prescribed on 06/12/14   metFORMIN (GLUCOPHAGE) 500 MG tablet  Dispense 60   Refill  5 Route: Take 1 tablet (500 mg total) by mouth 2 (two) times daily with a meal. - Oral

## 2014-09-17 ENCOUNTER — Other Ambulatory Visit: Payer: Self-pay | Admitting: Primary Care

## 2014-09-17 DIAGNOSIS — I10 Essential (primary) hypertension: Secondary | ICD-10-CM

## 2014-09-17 DIAGNOSIS — E119 Type 2 diabetes mellitus without complications: Secondary | ICD-10-CM

## 2014-09-17 DIAGNOSIS — Z0000 Encounter for general adult medical examination without abnormal findings: Secondary | ICD-10-CM

## 2014-09-18 NOTE — Telephone Encounter (Signed)
Pt left note requesting refill metformin; pt advised new rx sent to [facility] [address] on 09/04/14. Pt will ck with pharmacy.

## 2014-09-20 ENCOUNTER — Other Ambulatory Visit (INDEPENDENT_AMBULATORY_CARE_PROVIDER_SITE_OTHER): Payer: BLUE CROSS/BLUE SHIELD

## 2014-09-20 ENCOUNTER — Other Ambulatory Visit: Payer: Self-pay | Admitting: Primary Care

## 2014-09-20 DIAGNOSIS — E119 Type 2 diabetes mellitus without complications: Secondary | ICD-10-CM | POA: Diagnosis not present

## 2014-09-20 DIAGNOSIS — I10 Essential (primary) hypertension: Secondary | ICD-10-CM

## 2014-09-20 DIAGNOSIS — Z0000 Encounter for general adult medical examination without abnormal findings: Secondary | ICD-10-CM | POA: Diagnosis not present

## 2014-09-20 DIAGNOSIS — E1165 Type 2 diabetes mellitus with hyperglycemia: Secondary | ICD-10-CM

## 2014-09-20 DIAGNOSIS — IMO0002 Reserved for concepts with insufficient information to code with codable children: Secondary | ICD-10-CM

## 2014-09-20 LAB — COMPREHENSIVE METABOLIC PANEL
ALBUMIN: 3.7 g/dL (ref 3.5–5.2)
ALK PHOS: 54 U/L (ref 39–117)
ALT: 40 U/L (ref 0–53)
AST: 22 U/L (ref 0–37)
BILIRUBIN TOTAL: 0.5 mg/dL (ref 0.2–1.2)
BUN: 17 mg/dL (ref 6–23)
CALCIUM: 8.9 mg/dL (ref 8.4–10.5)
CO2: 25 mEq/L (ref 19–32)
CREATININE: 0.83 mg/dL (ref 0.40–1.50)
Chloride: 103 mEq/L (ref 96–112)
GFR: 104.86 mL/min (ref >60.00)
Glucose, Bld: 289 mg/dL — ABNORMAL HIGH (ref 70–99)
Potassium: 4.1 mEq/L (ref 3.5–5.1)
SODIUM: 136 meq/L (ref 135–145)
TOTAL PROTEIN: 6.6 g/dL (ref 6.0–8.3)

## 2014-09-20 LAB — LIPID PANEL
CHOLESTEROL: 168 mg/dL (ref 0–200)
HDL: 27.2 mg/dL — ABNORMAL LOW (ref >39.00)
LDL Cholesterol: 103 mg/dL — ABNORMAL HIGH (ref 0–99)
NonHDL: 140.89
Total CHOL/HDL Ratio: 6
Triglycerides: 187 mg/dL — ABNORMAL HIGH (ref 0.0–149.0)
VLDL: 37.4 mg/dL (ref 0.0–40.0)

## 2014-09-20 LAB — CBC
HCT: 42.9 % (ref 39.0–52.0)
Hemoglobin: 14.8 g/dL (ref 13.0–17.0)
MCHC: 34.5 g/dL (ref 30.0–36.0)
MCV: 90.7 fl (ref 78.0–100.0)
Platelets: 169 10*3/uL (ref 150.0–400.0)
RBC: 4.73 Mil/uL (ref 4.22–5.81)
RDW: 13.2 % (ref 11.5–15.5)
WBC: 6 10*3/uL (ref 4.0–10.5)

## 2014-09-20 LAB — TSH: TSH: 1.06 u[IU]/mL (ref 0.35–4.50)

## 2014-09-20 LAB — HEMOGLOBIN A1C: HEMOGLOBIN A1C: 10.5 % — AB (ref 4.6–6.5)

## 2014-09-20 MED ORDER — GLIPIZIDE ER 5 MG PO TB24: 5.0000 mg | ORAL_TABLET | Freq: Every day | ORAL | Status: DC

## 2014-09-27 ENCOUNTER — Encounter: Payer: BLUE CROSS/BLUE SHIELD | Admitting: Primary Care

## 2014-10-02 ENCOUNTER — Encounter: Payer: Self-pay | Admitting: Primary Care

## 2014-10-02 ENCOUNTER — Ambulatory Visit (INDEPENDENT_AMBULATORY_CARE_PROVIDER_SITE_OTHER): Payer: BLUE CROSS/BLUE SHIELD | Admitting: Primary Care

## 2014-10-02 VITALS — BP 138/92 | HR 83 | Temp 97.7°F | Ht 74.0 in | Wt 316.1 lb

## 2014-10-02 DIAGNOSIS — Z23 Encounter for immunization: Secondary | ICD-10-CM

## 2014-10-02 DIAGNOSIS — G63 Polyneuropathy in diseases classified elsewhere: Principal | ICD-10-CM

## 2014-10-02 DIAGNOSIS — E785 Hyperlipidemia, unspecified: Secondary | ICD-10-CM | POA: Diagnosis not present

## 2014-10-02 DIAGNOSIS — Z0000 Encounter for general adult medical examination without abnormal findings: Secondary | ICD-10-CM | POA: Insufficient documentation

## 2014-10-02 DIAGNOSIS — I10 Essential (primary) hypertension: Secondary | ICD-10-CM

## 2014-10-02 DIAGNOSIS — E118 Type 2 diabetes mellitus with unspecified complications: Secondary | ICD-10-CM

## 2014-10-02 DIAGNOSIS — G629 Polyneuropathy, unspecified: Secondary | ICD-10-CM

## 2014-10-02 DIAGNOSIS — E349 Endocrine disorder, unspecified: Secondary | ICD-10-CM | POA: Insufficient documentation

## 2014-10-02 MED ORDER — GABAPENTIN 300 MG PO CAPS: 300.0000 mg | ORAL_CAPSULE | Freq: Three times a day (TID) | ORAL | Status: DC

## 2014-10-02 MED ORDER — ATORVASTATIN CALCIUM 20 MG PO TABS: 20.0000 mg | ORAL_TABLET | Freq: Every day | ORAL | Status: DC

## 2014-10-02 NOTE — Addendum Note (Signed)
Addended by: Tawnya Crook on: 10/02/2014 04:47 PM   Modules accepted: Orders

## 2014-10-02 NOTE — Assessment & Plan Note (Signed)
Elevated trigs and LDL. Low HDL. Start atorvastatin 20 mg daily. LFT's WNL. Will recheck in 6 months.

## 2014-10-02 NOTE — Assessment & Plan Note (Signed)
Slightly above goal today. Managed on lisinopril 20 mg. He will check BP daily for 2 weeks and report readings to me. CMP WNL.

## 2014-10-02 NOTE — Assessment & Plan Note (Signed)
Tetanus UTD. Prevnar 13 and flu vaccinations provided today. Discussed importance of healthy diet and regular exercise. Exam unremarkable. Labs with low HDL, elevated LDL and Trigs, will treat with statin. Follow up in 1 year for repeat physical.

## 2014-10-02 NOTE — Assessment & Plan Note (Addendum)
A1C of 10.5 in 09/2014 which is slightly improved. Added Glipizide XL 5 mg daily. Will recheck A1C in 3 months. Eye exam, foot exam, and urine microalbumin UTD.

## 2014-10-02 NOTE — Patient Instructions (Addendum)
Start atorvastatin 20 mg (Lipitor) for cholesterol. Take 1 tablet by mouth at bedtime.  Start Gabapentin for sharp, shooting pain in feet. Take 1 capsule by mouth 2-3 times daily. Start this medication at bedtime, it may make you drowsy.  It is important that you improve your diet. Please limit carbohydrates in the form of white bread, rice, pasta, cakes, cookies, sugary drinks, etc. Increase your consumption of fresh fruits and vegetables. Be sure to drink plenty of water daily.  Start exercising if possible. You need 1 hour of moderate intensity exercise 5 days a week.  Follow up in 3 months for re-evaluation of diabetes.  It was a pleasure to see you today!

## 2014-10-02 NOTE — Progress Notes (Signed)
Pre visit review using our clinic review tool, if applicable. No additional management support is needed unless otherwise documented below in the visit note. 

## 2014-10-02 NOTE — Progress Notes (Signed)
Subjective:    Patient ID: Kenneth Cohen, male    DOB: 10-May-1965, 49 y.o.   MRN: [id number]  HPI  Kenneth Cohen is a 49 year old male who presents today for complete physical.  Immunizations: -Tetanus: Completed in June 2016 -Influenza: Has not completed, will do today. -Pneumonia: Has never completed, will do today  Diet: Breakfast: Egg or sausage, [employer] Lunch: Varies. Hotdog or hamburger. Dinner: Some home prepared meals with meat and sides. Eggs. Fast food. Beverages: Water and diet mountain dew, sweet tea. Desserts: Overall reduced but still consumes several times weekly. Exercise: He does not exercise, but is active at work with lifting.   Wt Readings from Last 3 Encounters:  10/02/14 316 lb 1.9 oz (143.391 kg)  08/02/14 305 lb 12.8 oz (138.71 kg)  06/26/14 311 lb 12.8 oz (141.432 kg)    BP Readings from Last 3 Encounters:  10/02/14 138/92  08/02/14 124/86  06/26/14 142/94     Eye exam: Completed in 2016 Dental exam: Has not completed in years.   Review of Systems  Respiratory: Negative for shortness of breath.   Cardiovascular: Negative for chest pain.  Gastrointestinal: Negative for diarrhea and constipation.       Some diarrhea with Metformin.  Genitourinary: Negative for difficulty urinating.  Musculoskeletal: Positive for arthralgias.       Pain to joints in hands. History of shattered fingers.  Skin: Negative for rash.  Neurological: Positive for numbness. Negative for dizziness and headaches.       Numbness/tingling to feet and fingers with sharp/shooting pain in feet frequently.       Past Medical History  Diagnosis Date  . Hypertension   . Obesity   . Testicular torsion     as a child    Social History   Social History  . Marital Status: Married    Spouse Name: N/A  . Number of Children: N/A  . Years of Education: N/A   Occupational History  . Not on file.   Social History Main Topics  . Smoking status: Former Smoker    Quit  date: 01/20/2004  . Smokeless tobacco: Not on file  . Alcohol Use: 0.0 oz/week    0 Standard drinks or equivalent per week     Comment: rarely  . Drug Use: No  . Sexual Activity: Not on file   Other Topics Concern  . Not on file   Social History Narrative   Married.   One son, junior in [employer].   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   Enjoys playing on the computer.    No past surgical history on file.  Family History  Problem Relation Age of Onset  . Hypertension Mother   . Diabetes Father   . Diabetes Mother     Allergies  Allergen Reactions  . Ceclor [Cefaclor] Swelling    Current Outpatient Prescriptions on File Prior to Visit  Medication Sig Dispense Refill  . clindamycin (CLEOCIN) 300 MG capsule Take 1 capsule (300 mg total) by mouth 3 (three) times daily. 42 capsule 0  . doxycycline (VIBRA-TABS) 100 MG tablet Take 1 tablet (100 mg total) by mouth 2 (two) times daily. 20 tablet 0  . glipiZIDE (GLIPIZIDE XL) 5 MG 24 hr tablet Take 1 tablet (5 mg total) by mouth daily with breakfast. 30 tablet 3  . lisinopril (PRINIVIL,ZESTRIL) 20 MG tablet Take 1 tablet (20 mg total) by mouth daily. 30 tablet 3  . metFORMIN (GLUCOPHAGE) 500 MG tablet  Take 2 tablets (1,000 mg total) by mouth 2 (two) times daily with a meal. 120 tablet 5  . sildenafil (REVATIO) 20 MG tablet Take 2 tablets by mouth 30 minutes to 1 hour prior to intercourse. 20 tablet 0  . silver sulfADIAZINE (SILVADENE) 1 % cream Apply 1 application topically daily. 50 g 0   No current facility-administered medications on file prior to visit.    BP 138/92 mmHg  Pulse 83  Temp(Src) 97.7 F (36.5 C) (Oral)  Ht 6\' 2"  (1.88 m)  Wt 316 lb 1.9 oz (143.391 kg)  BMI 40.57 kg/m2  SpO2 98%    Objective:   Physical Exam  Constitutional: He is oriented to person, place, and time. He appears well-nourished.  HENT:  Right Ear: Tympanic membrane and ear canal normal.  Left Ear: Tympanic membrane and ear canal  normal.  Nose: Nose normal.  Mouth/Throat: Oropharynx is clear and moist.  Eyes: Conjunctivae and EOM are normal. Pupils are equal, round, and reactive to light.  Neck: Neck supple.  Cardiovascular: Normal rate and regular rhythm.   Pulmonary/Chest: Effort normal and breath sounds normal.  Abdominal: Soft. Bowel sounds are normal. There is no tenderness.  Musculoskeletal: Normal range of motion.  Lymphadenopathy:    He has no cervical adenopathy.  Neurological: He is alert and oriented to person, place, and time. He has normal reflexes. No cranial nerve deficit.  Skin: Skin is warm and dry.  Psychiatric: He has a normal mood and affect.          Assessment & Plan:

## 2014-10-02 NOTE — Assessment & Plan Note (Signed)
Describes neuropathy (sharp, shooting pain in feet) daily. Start gabapentin 300 mg 2-3 times daily. Drowsiness precautions provided.  Will continue to monitor.

## 2014-10-16 ENCOUNTER — Telehealth: Payer: Self-pay | Admitting: Primary Care

## 2014-10-16 NOTE — Telephone Encounter (Signed)
Will you ask Mr. Kenneth Cohen how his BP has been running? Thanks.

## 2014-10-16 NOTE — Telephone Encounter (Signed)
Called patient. Patient stated that he has not been able to check BP yet. He is going to buy another BP machine when he gets pay so he can check it later. Patient stated he is doing good. His sugar has been running under 200 lately with the new medication.

## 2014-10-17 ENCOUNTER — Telehealth: Payer: Self-pay | Admitting: Primary Care

## 2014-10-17 NOTE — Telephone Encounter (Signed)
Noted  

## 2014-10-31 ENCOUNTER — Telehealth: Payer: Self-pay | Admitting: Primary Care

## 2014-10-31 ENCOUNTER — Other Ambulatory Visit: Payer: Self-pay | Admitting: Primary Care

## 2014-10-31 DIAGNOSIS — I10 Essential (primary) hypertension: Secondary | ICD-10-CM

## 2014-10-31 DIAGNOSIS — N529 Male erectile dysfunction, unspecified: Secondary | ICD-10-CM

## 2014-10-31 MED ORDER — LISINOPRIL 20 MG PO TABS: 20.0000 mg | ORAL_TABLET | Freq: Every day | ORAL | Status: DC

## 2014-10-31 MED ORDER — SILDENAFIL CITRATE 20 MG PO TABS: ORAL_TABLET | ORAL | Status: DC

## 2014-10-31 NOTE — Telephone Encounter (Signed)
Was on the phone with patient's wife for her health reasons. Patient's wife stated that patient's blood pressure runs about the same. Also they are at [facility] now and asked if Rx has been sent in.  Notified Jae DireKate of what patient's wife inform me.  [facility]

## 2014-10-31 NOTE — Telephone Encounter (Signed)
Called and spoken to patient's wife regarding her BP. However, patient wanted to refill his medication and he was going to call today any ways.  Refill   lisinopril (PRINIVIL,ZESTRIL) 20 MG tablet 30 tablet Last prescribed on 06/26/2014  Route: Take 1 tablet (20 mg total) by mouth daily.  Dispense 30   Refill  3  sildenafil (REVATIO) 20 MG tablet 20 tablet  Last prescribed on 08/17/2014   Take 2 tablets by mouth 30 minutes to 1 hour prior to intercourse. Dispense 20  Refill 0   Last seen on 10/02/2014. Follow up on 01/01/15

## 2014-10-31 NOTE — Telephone Encounter (Signed)
What has his blood pressure been running?

## 2015-01-01 ENCOUNTER — Ambulatory Visit (INDEPENDENT_AMBULATORY_CARE_PROVIDER_SITE_OTHER): Payer: BLUE CROSS/BLUE SHIELD | Admitting: Primary Care

## 2015-01-01 ENCOUNTER — Encounter: Payer: Self-pay | Admitting: Primary Care

## 2015-01-01 ENCOUNTER — Other Ambulatory Visit: Payer: Self-pay | Admitting: Primary Care

## 2015-01-01 ENCOUNTER — Encounter (INDEPENDENT_AMBULATORY_CARE_PROVIDER_SITE_OTHER): Payer: Self-pay

## 2015-01-01 VITALS — BP 146/96 | HR 80 | Temp 97.5°F | Ht 74.0 in | Wt 325.0 lb

## 2015-01-01 DIAGNOSIS — E119 Type 2 diabetes mellitus without complications: Secondary | ICD-10-CM | POA: Diagnosis not present

## 2015-01-01 DIAGNOSIS — E118 Type 2 diabetes mellitus with unspecified complications: Secondary | ICD-10-CM

## 2015-01-01 DIAGNOSIS — I10 Essential (primary) hypertension: Secondary | ICD-10-CM | POA: Diagnosis not present

## 2015-01-01 DIAGNOSIS — E785 Hyperlipidemia, unspecified: Secondary | ICD-10-CM

## 2015-01-01 LAB — LIPID PANEL
CHOL/HDL RATIO: 4
CHOLESTEROL: 123 mg/dL (ref 0–200)
HDL: 30.5 mg/dL — AB (ref >39.00)
LDL CALC: 66 mg/dL (ref 0–99)
NonHDL: 92.79
TRIGLYCERIDES: 136 mg/dL (ref 0.0–149.0)
VLDL: 27.2 mg/dL (ref 0.0–40.0)

## 2015-01-01 LAB — HEMOGLOBIN A1C: Hgb A1c MFr Bld: 8.8 % — ABNORMAL HIGH (ref 4.6–6.5)

## 2015-01-01 MED ORDER — LISINOPRIL 40 MG PO TABS: 40.0000 mg | ORAL_TABLET | Freq: Every day | ORAL | Status: DC

## 2015-01-01 MED ORDER — GLIPIZIDE ER 10 MG PO TB24: 10.0000 mg | ORAL_TABLET | Freq: Every day | ORAL | Status: DC

## 2015-01-01 NOTE — Patient Instructions (Signed)
Increase Lisinopril to 40 mg daily for high blood pressure. You may take 2 of your 20 mg tablets until your bottle is empty. I have sent the 40 mg tablets to your pharmacy.  Complete lab work prior to leaving today. I will notify you of your results once received.   Nasal congestion and sinus pressure: fluticasone (Flonase) nasal spray. Instill 2 sprays in each nostril once daily. Also try taking Zyrtec at bedtime.  Please call me Monday next week if no improvement.  It was a pleasure to see you today!

## 2015-01-01 NOTE — Assessment & Plan Note (Signed)
Above goal last visit and today. Increase lisinopril to 40 mg today. Will continue to monitor.

## 2015-01-01 NOTE — Progress Notes (Signed)
Subjective:    Patient ID: Kenneth Cohen, male    DOB: Jun 17, 1965, 49 y.o.   MRN: [id number]  HPI  Mr. Spake is a 49 year old male who presents today for follow up.  1) Essential Hypertension: Currently managed on lisinopril 20 mg. His BP is above goal in the clinic today.  Denies chest pain, dizziness, headaches. He's checked his BP at [facility] a few times since our last visit and will get 140's/90's.  BP Readings from Last 3 Encounters:  01/01/15 146/96  10/02/14 138/92  08/02/14 124/86     2) Type 2 Diabetes: Currently managed on metformin 1000 mg BID and Glipizide XL 5 mg daily. Last A1C was 10.5 in September 2016. He's not worked to improve his diet recently. He is not checking his sugars recently. He is due for A1C today. Denies dizziness, increased numbness/tingling.  3) Hyperlipidemia: Currently managed on atorvastatin 20 mg daily. He's has mild, occasional myalgias that are not bothersome. He's not worked to improve his diet recently and is not currently exercising.  4) Nasal Congestion: Facial pain, tooth pain, cough, chest congestion for the past 3 days. He's taken alka-selzer without relief. Denies fevers, chills, productive cough. He's missed work last night as he felt awful.   Review of Systems  Constitutional: Positive for fatigue. Negative for fever and chills.  HENT: Positive for congestion, sinus pressure and sore throat. Negative for ear pain.   Respiratory: Negative for shortness of breath.   Cardiovascular: Negative for chest pain.  Musculoskeletal: Positive for myalgias.  Neurological: Negative for dizziness and headaches.       Past Medical History  Diagnosis Date  . Hypertension   . Obesity   . Testicular torsion     as a child    Social History   Social History  . Marital Status: Married    Spouse Name: N/A  . Number of Children: N/A  . Years of Education: N/A   Occupational History  . Not on file.   Social History Main Topics  .  Smoking status: Former Smoker    Quit date: 01/20/2004  . Smokeless tobacco: Not on file  . Alcohol Use: 0.0 oz/week    0 Standard drinks or equivalent per week     Comment: rarely  . Drug Use: No  . Sexual Activity: Not on file   Other Topics Concern  . Not on file   Social History Narrative   Married.   One son, junior in [employer].   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   Enjoys playing on the computer.    No past surgical history on file.  Family History  Problem Relation Age of Onset  . Hypertension Mother   . Diabetes Father   . Diabetes Mother     Allergies  Allergen Reactions  . Ceclor [Cefaclor] Swelling    Current Outpatient Prescriptions on File Prior to Visit  Medication Sig Dispense Refill  . atorvastatin (LIPITOR) 20 MG tablet Take 1 tablet (20 mg total) by mouth daily. 90 tablet 3  . clindamycin (CLEOCIN) 300 MG capsule Take 1 capsule (300 mg total) by mouth 3 (three) times daily. 42 capsule 0  . gabapentin (NEURONTIN) 300 MG capsule Take 1 capsule (300 mg total) by mouth 3 (three) times daily. 90 capsule 3  . glipiZIDE (GLIPIZIDE XL) 5 MG 24 hr tablet Take 1 tablet (5 mg total) by mouth daily with breakfast. 30 tablet 3  . metFORMIN (GLUCOPHAGE) 500 MG tablet  Take 2 tablets (1,000 mg total) by mouth 2 (two) times daily with a meal. 120 tablet 5  . sildenafil (REVATIO) 20 MG tablet Take 2 tablets by mouth 30 minutes to 1 hour prior to intercourse. 20 tablet 0  . silver sulfADIAZINE (SILVADENE) 1 % cream Apply 1 application topically daily. 50 g 0   No current facility-administered medications on file prior to visit.    BP 146/96 mmHg  Pulse 80  Temp(Src) 97.5 F (36.4 C) (Oral)  Ht  (1.88 m)  Wt 325 lb (147.419 kg)  BMI 41.71 kg/m2  SpO2 98%    Objective:   Physical Exam  Constitutional: He appears well-nourished.  HENT:  Right Ear: External ear normal.  Left Ear: External ear normal.  Nose: Nose normal.  Mouth/Throat:  Oropharynx is clear and moist.  Eyes: Pupils are equal, round, and reactive to light.  Neck: Neck supple.  Cardiovascular: Normal rate and regular rhythm.   Pulmonary/Chest: Effort normal and breath sounds normal.  Skin: Skin is warm and dry.          Assessment & Plan:  Nasal congestion:  Facial pressure, sore throat, fatigue, mild cough x 3 days. No improvement with alka-selzer plus. Exam unremarkable, lungs clear. Suspect viral sinus infection at this point and will treat with supportive measures. Flonase, zyrtec, fluids, rest. He is to call Monday next week if not improved.

## 2015-01-01 NOTE — Assessment & Plan Note (Signed)
Managed on atorvastatin 20 mg. Lipid panel due today and is pending. Discussed the importance of a healthy diet and regular exercise in order for weight loss and to reduce risk of other medical diseases.

## 2015-01-01 NOTE — Progress Notes (Signed)
Pre visit review using our clinic review tool, if applicable. No additional management support is needed unless otherwise documented below in the visit note. 

## 2015-01-01 NOTE — Assessment & Plan Note (Signed)
Poor diet overall. Managed on metformin 1000 mg bid and glipizide XL 5 mg. A1C last visit 10.5, due and pending today. If not improved will increase glipizide to 10 mg. Had a long discussion regarding importance of diet and exercise.

## 2015-01-31 ENCOUNTER — Telehealth: Payer: Self-pay | Admitting: Primary Care

## 2015-01-31 NOTE — Telephone Encounter (Signed)
Pt request cb -  Has felt flu like symptoms, has had fever, comes and goes, wants to know if it normal  [phone]

## 2015-01-31 NOTE — Telephone Encounter (Signed)
Could be viral. Not abnormal for this time of year.  Fevers and body aches: Tylenol as needed. Cough/Congestion: Robitussin DM  Water and rest.

## 2015-01-31 NOTE — Telephone Encounter (Signed)
Called and notified patient of Kate's comments. Patient verbalized understanding. Also informed patient if no improvement or worse to follow up or urgent care.

## 2015-03-20 ENCOUNTER — Other Ambulatory Visit: Payer: Self-pay | Admitting: Primary Care

## 2015-03-20 DIAGNOSIS — E119 Type 2 diabetes mellitus without complications: Secondary | ICD-10-CM

## 2015-03-20 DIAGNOSIS — N529 Male erectile dysfunction, unspecified: Secondary | ICD-10-CM

## 2015-03-20 MED ORDER — SILDENAFIL CITRATE 20 MG PO TABS: ORAL_TABLET | ORAL | Status: DC

## 2015-03-20 MED ORDER — METFORMIN HCL 500 MG PO TABS: 1000.0000 mg | ORAL_TABLET | Freq: Two times a day (BID) | ORAL | Status: DC

## 2015-03-20 NOTE — Telephone Encounter (Signed)
Patient's wife called and stated patient needed refills on Metformin and sildenafi.  Last prescribed on 09/04/2014. Last seen on 01/01/2015. Lab appointment on 04/03/2015.

## 2015-03-29 ENCOUNTER — Other Ambulatory Visit: Payer: Self-pay | Admitting: Primary Care

## 2015-03-29 DIAGNOSIS — E119 Type 2 diabetes mellitus without complications: Secondary | ICD-10-CM

## 2015-04-03 ENCOUNTER — Other Ambulatory Visit (INDEPENDENT_AMBULATORY_CARE_PROVIDER_SITE_OTHER): Payer: BLUE CROSS/BLUE SHIELD

## 2015-04-03 ENCOUNTER — Other Ambulatory Visit: Payer: Self-pay | Admitting: Primary Care

## 2015-04-03 DIAGNOSIS — E1149 Type 2 diabetes mellitus with other diabetic neurological complication: Secondary | ICD-10-CM

## 2015-04-03 DIAGNOSIS — E118 Type 2 diabetes mellitus with unspecified complications: Secondary | ICD-10-CM

## 2015-04-03 DIAGNOSIS — E119 Type 2 diabetes mellitus without complications: Secondary | ICD-10-CM

## 2015-04-03 LAB — HEMOGLOBIN A1C: Hgb A1c MFr Bld: 7.7 % — ABNORMAL HIGH (ref 4.6–6.5)

## 2015-04-03 MED ORDER — GABAPENTIN 600 MG PO TABS: 600.0000 mg | ORAL_TABLET | Freq: Three times a day (TID) | ORAL | Status: DC

## 2015-04-03 MED ORDER — SAXAGLIPTIN HCL 2.5 MG PO TABS: 2.5000 mg | ORAL_TABLET | Freq: Every day | ORAL | Status: DC

## 2015-04-04 ENCOUNTER — Encounter: Payer: Self-pay | Admitting: *Deleted

## 2015-04-05 ENCOUNTER — Other Ambulatory Visit: Payer: Self-pay | Admitting: Primary Care

## 2015-04-05 DIAGNOSIS — E119 Type 2 diabetes mellitus without complications: Secondary | ICD-10-CM

## 2015-04-05 MED ORDER — CANAGLIFLOZIN 100 MG PO TABS: 100.0000 mg | ORAL_TABLET | Freq: Every day | ORAL | Status: DC

## 2015-04-09 ENCOUNTER — Telehealth: Payer: Self-pay | Admitting: Primary Care

## 2015-04-09 NOTE — Telephone Encounter (Signed)
We are out of options besides insulin or injectable medication. If he can work to improve his diet and lose weight I'm willing to let him manage with his current meds and recheck him in 3 months. If his levels become elevated, then we will need to consider injections for medication.  Please schedule him for an office visit after June 15th for follow up and labs.

## 2015-04-09 NOTE — Telephone Encounter (Signed)
Pt called wanting chan to call him  Regarding dm meds that was called in Friday   His insurance is not paying very will he wanted to know if there was something else he could take  [facility] [address] Please let pt know

## 2015-04-10 NOTE — Telephone Encounter (Signed)
Message left for patient to return my call.  

## 2015-04-10 NOTE — Telephone Encounter (Signed)
Pt returned your call Best number [phone]

## 2015-04-10 NOTE — Telephone Encounter (Signed)
Patient notified. Diabetic diet mailed to patient appt scheduled.

## 2015-05-01 ENCOUNTER — Other Ambulatory Visit: Payer: Self-pay

## 2015-05-01 DIAGNOSIS — I10 Essential (primary) hypertension: Secondary | ICD-10-CM

## 2015-05-01 MED ORDER — LISINOPRIL 40 MG PO TABS: 40.0000 mg | ORAL_TABLET | Freq: Every day | ORAL | Status: DC

## 2015-05-01 NOTE — Telephone Encounter (Signed)
Pt request refill of lisinopril 40 mg to [facility] [address]. Refill done per protocol; and pt voiced understanding. Pt seen 01/01/15 and to f/u in June per lab result note.

## 2015-07-08 ENCOUNTER — Other Ambulatory Visit: Payer: Self-pay | Admitting: Primary Care

## 2015-07-08 ENCOUNTER — Ambulatory Visit (INDEPENDENT_AMBULATORY_CARE_PROVIDER_SITE_OTHER): Payer: BLUE CROSS/BLUE SHIELD | Admitting: Primary Care

## 2015-07-08 ENCOUNTER — Encounter: Payer: Self-pay | Admitting: Primary Care

## 2015-07-08 VITALS — BP 148/96 | HR 84 | Temp 98.6°F | Ht 74.0 in | Wt 331.0 lb

## 2015-07-08 DIAGNOSIS — E349 Endocrine disorder, unspecified: Secondary | ICD-10-CM | POA: Diagnosis not present

## 2015-07-08 DIAGNOSIS — E118 Type 2 diabetes mellitus with unspecified complications: Secondary | ICD-10-CM | POA: Diagnosis not present

## 2015-07-08 DIAGNOSIS — G63 Polyneuropathy in diseases classified elsewhere: Secondary | ICD-10-CM

## 2015-07-08 DIAGNOSIS — E1140 Type 2 diabetes mellitus with diabetic neuropathy, unspecified: Secondary | ICD-10-CM

## 2015-07-08 DIAGNOSIS — I10 Essential (primary) hypertension: Secondary | ICD-10-CM | POA: Diagnosis not present

## 2015-07-08 DIAGNOSIS — E785 Hyperlipidemia, unspecified: Secondary | ICD-10-CM

## 2015-07-08 LAB — BASIC METABOLIC PANEL
BUN: 16 mg/dL (ref 6–23)
CHLORIDE: 103 meq/L (ref 96–112)
CO2: 22 mEq/L (ref 19–32)
Calcium: 8.9 mg/dL (ref 8.4–10.5)
Creatinine, Ser: 0.83 mg/dL (ref 0.40–1.50)
GFR: 104.51 mL/min (ref >60.00)
GLUCOSE: 327 mg/dL — AB (ref 70–99)
POTASSIUM: 4.3 meq/L (ref 3.5–5.1)
Sodium: 136 mEq/L (ref 135–145)

## 2015-07-08 LAB — HEMOGLOBIN A1C: Hgb A1c MFr Bld: 8.3 % — ABNORMAL HIGH (ref 4.6–6.5)

## 2015-07-08 MED ORDER — HYDROCHLOROTHIAZIDE 25 MG PO TABS: 25.0000 mg | ORAL_TABLET | Freq: Every day | ORAL | Status: DC

## 2015-07-08 MED ORDER — ONETOUCH SURESOFT LANCING DEV MISC: Status: DC

## 2015-07-08 MED ORDER — INSULIN GLARGINE 100 UNIT/ML SOLOSTAR PEN: 15.0000 [IU] | PEN_INJECTOR | Freq: Every day | SUBCUTANEOUS | Status: DC

## 2015-07-08 MED ORDER — GLUCOSE BLOOD VI STRP: ORAL_STRIP | Status: DC

## 2015-07-08 MED ORDER — ONETOUCH ULTRA 2 W/DEVICE KIT: PACK | Status: DC

## 2015-07-08 NOTE — Progress Notes (Signed)
Pre visit review using our clinic review tool, if applicable. No additional management support is needed unless otherwise documented below in the visit note. 

## 2015-07-08 NOTE — Assessment & Plan Note (Signed)
Above goal in clinic today and also above goal on prior visits. Continue lisinopril 40 mg, add hydrochlorothiazide 25 mg. BMP stable today.

## 2015-07-08 NOTE — Progress Notes (Signed)
Subjective:    Patient ID: James IvanoffRonnie Nettleton, male    DOB: 26-Sep-1965, 50 y.o.   MRN: [id number]  HPI  Mr. Castrillon is a 50 year old male who presents today for follow up.  1) Type 2 Diabetes: A1C of 7.7 in March 2017. He is currently managed on Glipizide XL 10 mg, and Metformin 1000 mg BID. He is also taking Gabapentin 600 mg TID for diabetic neuropathy. Last visit we added Invokana 100 mg to his regimen but could not afford. He also could not afford Januvia. He has not been working on his diet and is not exercising. He is open to starting insulin if his A1C is still above goal. He is due for repeat A1c today. He continues to experience numbness to his feet, although the pain has improved with gabapentin.   Wt Readings from Last 3 Encounters:  07/08/15 331 lb (150.141 kg)  01/01/15 325 lb (147.419 kg)  10/02/14 316 lb 1.9 oz (143.391 kg)     2) Essential Hypertension: Currently managed on lisinopril 40 mg. His BP is above goal in the office today and has been so during his several last visits. Over the past several months he's noticed lower extremity edema intermittently. Denies chest pain, dizziness, shortness of breath.  3) Hyperlipidemia: Currently managed on atorvatatin 20 mg. Lipid panel in December 2016 stable. He has gained 6 pounds since his last visit and is not working on improvements in diet.  4) Arthritis: Stiffness with discomfort to fingers bilaterally. His symptoms occur throughout the day and are no worse at particular times. Denies inflammation. History of multiple fractures and injuries to hands in the past. He's currently taking BC powder and advil for pain with some improvement. 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  Review of Systems  Respiratory: Negative for cough and shortness of breath.   Cardiovascular: Positive for leg swelling. Negative for chest pain.  Musculoskeletal: Positive for arthralgias.  Neurological:  Positive for numbness. Negative for dizziness.       Past Medical History  Diagnosis Date  . Hypertension   . Obesity   . Testicular torsion     as a child     Social History   Social History  . Marital Status: Married    Spouse Name: N/A  . Number of Children: N/A  . Years of Education: N/A   Occupational History  . Not on file.   Social History Main Topics  . Smoking status: Former Smoker    Quit date: 01/20/2004  . Smokeless tobacco: Not on file  . Alcohol Use: 0.0 oz/week    0 Standard drinks or equivalent per week     Comment: rarely  . Drug Use: No  . Sexual Activity: Not on file   Other Topics Concern  . Not on file   Social History Narrative   Married.   One son, junior in [employer].   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   Enjoys playing on the computer.    No past surgical history on file.  Family History  Problem Relation Age of Onset  . Hypertension Mother   . Diabetes Father   . Diabetes Mother     Allergies  Allergen Reactions  . Ceclor [Cefaclor] Swelling    Current Outpatient Prescriptions on File Prior to Visit  Medication Sig Dispense Refill  . atorvastatin (LIPITOR) 20 MG tablet Take 1 tablet (20 mg total) by  mouth daily. 90 tablet 3  . gabapentin (NEURONTIN) 600 MG tablet Take 1 tablet (600 mg total) by mouth 3 (three) times daily. 90 tablet 3  . lisinopril (PRINIVIL,ZESTRIL) 40 MG tablet Take 1 tablet (40 mg total) by mouth daily. 30 tablet 3  . metFORMIN (GLUCOPHAGE) 500 MG tablet Take 2 tablets (1,000 mg total) by mouth 2 (two) times daily with a meal. 120 tablet 5  . sildenafil (REVATIO) 20 MG tablet Take 2 tablets by mouth 30 minutes to 1 hour prior to intercourse. 20 tablet 0   No current facility-administered medications on file prior to visit.    BP 148/96 mmHg  Pulse 84  Temp(Src) 98.6 F (37 C) (Oral)  Ht  (1.88 m)  Wt 331 lb (150.141 kg)  BMI 42.48 kg/m2  SpO2 99%    Objective:   Physical Exam    Constitutional: He appears well-nourished.  Cardiovascular: Normal rate and regular rhythm.   Mild ankle edema bilaterally noted. No pitting.  Pulmonary/Chest: Effort normal and breath sounds normal.  Skin: Skin is warm and dry.  Psychiatric: He has a normal mood and affect.          Assessment & Plan:

## 2015-07-08 NOTE — Assessment & Plan Note (Signed)
Repeat lipids in December.

## 2015-07-08 NOTE — Patient Instructions (Signed)
Complete lab work prior to leaving today. I will notify you of your results once received.   Start Hydrochlorothiazide 25 mg tablets for high blood pressure. Take 1 tablet by mouth everyday. Continue taking Lisinopril 40 mg for blood pressure.  Schedule a follow up appointment in 6 months for your annual physical.  It was a pleasure to see you today!  Diabetes Mellitus and Food It is important for you to manage your blood sugar (glucose) level. Your blood glucose level can be greatly affected by what you eat. Eating healthier foods in the appropriate amounts throughout the day at about the same time each day will help you control your blood glucose level. It can also help slow or prevent worsening of your diabetes mellitus. Healthy eating may even help you improve the level of your blood pressure and reach or maintain a healthy weight.  General recommendations for healthful eating and cooking habits include:  Eating meals and snacks regularly. Avoid going long periods of time without eating to lose weight.  Eating a diet that consists mainly of plant-based foods, such as fruits, vegetables, nuts, legumes, and whole grains.  Using low-heat cooking methods, such as baking, instead of high-heat cooking methods, such as deep frying. Work with your dietitian to make sure you understand how to use the Nutrition Facts information on food labels. HOW CAN FOOD AFFECT ME? Carbohydrates Carbohydrates affect your blood glucose level more than any other type of food. Your dietitian will help you determine how many carbohydrates to eat at each meal and teach you how to count carbohydrates. Counting carbohydrates is important to keep your blood glucose at a healthy level, especially if you are using insulin or taking certain medicines for diabetes mellitus. Alcohol Alcohol can cause sudden decreases in blood glucose (hypoglycemia), especially if you use insulin or take certain medicines for diabetes mellitus.  Hypoglycemia can be a life-threatening condition. Symptoms of hypoglycemia (sleepiness, dizziness, and disorientation) are similar to symptoms of having too much alcohol.  If your health care provider has given you approval to drink alcohol, do so in moderation and use the following guidelines:  Women should not have more than one drink per day, and men should not have more than two drinks per day. One drink is equal to:  12 oz of beer.  5 oz of wine.  1 oz of hard liquor.  Do not drink on an empty stomach.  Keep yourself hydrated. Have water, diet soda, or unsweetened iced tea.  Regular soda, juice, and other mixers might contain a lot of carbohydrates and should be counted. WHAT FOODS ARE NOT RECOMMENDED? As you make food choices, it is important to remember that all foods are not the same. Some foods have fewer nutrients per serving than other foods, even though they might have the same number of calories or carbohydrates. It is difficult to get your body what it needs when you eat foods with fewer nutrients. Examples of foods that you should avoid that are high in calories and carbohydrates but low in nutrients include:  Trans fats (most processed foods list trans fats on the Nutrition Facts label).  Regular soda.  Juice.  Candy.  Sweets, such as cake, pie, doughnuts, and cookies.  Fried foods. WHAT FOODS CAN I EAT? Eat nutrient-rich foods, which will nourish your body and keep you healthy. The food you should eat also will depend on several factors, including:  The calories you need.  The medicines you take.  Your weight.  Your blood glucose level.  Your blood pressure level.  Your cholesterol level. You should eat a variety of foods, including:  Protein.  Lean cuts of meat.  Proteins low in saturated fats, such as fish, egg whites, and beans. Avoid processed meats.  Fruits and vegetables.  Fruits and vegetables that may help control blood glucose levels,  such as apples, mangoes, and yams.  Dairy products.  Choose fat-free or low-fat dairy products, such as milk, yogurt, and cheese.  Grains, bread, pasta, and rice.  Choose whole grain products, such as multigrain bread, whole oats, and brown rice. These foods may help control blood pressure.  Fats.  Foods containing healthful fats, such as nuts, avocado, olive oil, canola oil, and fish. DOES EVERYONE WITH DIABETES MELLITUS HAVE THE SAME MEAL PLAN? Because every person with diabetes mellitus is different, there is not one meal plan that works for everyone. It is very important that you meet with a dietitian who will help you create a meal plan that is just right for you.   This information is not intended to replace advice given to you by your health care provider. Make sure you discuss any questions you have with your health care provider.   Document Released: 10/02/2004 Document Revised: 01/26/2014 Document Reviewed: 12/02/2012 Elsevier Interactive Patient Education [employer]

## 2015-07-08 NOTE — Assessment & Plan Note (Signed)
Overall improved on gabapentin 600 mg. Continue for now.

## 2015-07-08 NOTE — Assessment & Plan Note (Signed)
A1c today is 8.3 which is an increase from 7.7 three months ago. He is tired of taking oral medication and will like to transition to insulin. Agree that this is necessary as he is likely going to be resistant to most oral meds at this point. Continue metformin for now, discontinue glipizide.  Start Lantus 15 units at bedtime. Glucose meter, lancets, test strips sent to pharmacy for patient to test twice daily before meals. Will call patient in 2 weeks for blood sugar readings. Precautions provided regarding low readings and he will report if necessary.. Follow-up in 3 months for reevaluation and repeat A1c.

## 2015-07-09 ENCOUNTER — Encounter: Payer: Self-pay | Admitting: *Deleted

## 2015-07-12 ENCOUNTER — Telehealth: Payer: Self-pay

## 2015-07-12 NOTE — Telephone Encounter (Signed)
Pt last seen 07/08/15 and was started on Lantus 15 units at hs (pts bedtime is morning time due to working 3rd shift). Pt also taking metformin 500 mg 2 tabs bid. Today 6 AM FBS 301; at 11 AM BS was 259. Pt says he feels OK now; last night had dizziness x 1. Pt ate small bag of pretzels last night and drank diet soda.  pts BS usually is in 250's.  Pt wants to know if needs to adjust lantus dosage. Pt request cb. [facility] [address].

## 2015-07-12 NOTE — Telephone Encounter (Signed)
Will have him continue at current dose for now. Please have patient call back with sugar logs in one week, may need adjustment at that time.

## 2015-07-12 NOTE — Telephone Encounter (Signed)
Spoken and notified patient of Kate's comments. Patient verbalized understanding. 

## 2015-07-15 ENCOUNTER — Encounter: Payer: Self-pay | Admitting: Primary Care

## 2015-07-15 ENCOUNTER — Ambulatory Visit (INDEPENDENT_AMBULATORY_CARE_PROVIDER_SITE_OTHER): Payer: BLUE CROSS/BLUE SHIELD | Admitting: Primary Care

## 2015-07-15 VITALS — BP 138/84 | HR 90 | Temp 98.1°F | Ht 74.0 in | Wt 318.8 lb

## 2015-07-15 DIAGNOSIS — I872 Venous insufficiency (chronic) (peripheral): Secondary | ICD-10-CM

## 2015-07-15 NOTE — Progress Notes (Signed)
Pre visit review using our clinic review tool, if applicable. No additional management support is needed unless otherwise documented below in the visit note. 

## 2015-07-15 NOTE — Patient Instructions (Signed)
Your swelling is caused by what's called venous insufficiency. Elevate your legs when you are resting at home.   Increase your Lantus to 20 units once daily. Continue Metformin. Call me next week with your sugar readings.  Please notify me if you continue to experience swelling as we may need to switch your hydrochlorothiazide to lasix.   It was a pleasure to see you today!

## 2015-07-15 NOTE — Progress Notes (Signed)
Subjective:    Patient ID: Kenneth Cohen, male    DOB: [birth year], 50 y.o.   MRN: [id number]  HPI  Kenneth Cohen is a 50 year old male with a history of essential hypertension and type 2 diabetes who presents today with a chief complaint of edema. The swelling is present to his left lower extremity (foot, ankle, and lower part of extremity) since Friday afternoon after his appointment. His swelling was worse Saturday night, but continues to fluctuate with swelling overall. He experiences pain to his ankle circumferentially. Denies calf pain, recent injury, erythema. 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 He's not taken anything for his swelling, recently started on HCTZ 25 mg, and does not elevate his extremities.    Review of Systems  Respiratory: Negative for cough and shortness of breath.   Cardiovascular: Positive for leg swelling. Negative for chest pain.  Musculoskeletal: Negative for joint swelling.  Skin:          Neurological: Negative for dizziness and headaches.       Past Medical History  Diagnosis Date  . Hypertension   . Obesity   . Testicular torsion     as a child     Social History   Social History  . Marital Status: Married    Spouse Name: N/A  . Number of Children: N/A  . Years of Education: N/A   Occupational History  . Not on file.   Social History Main Topics  . Smoking status: Former Smoker    Quit date: 01/20/2004  . Smokeless tobacco: Not on file  . Alcohol Use: 0.0 oz/week    0 Standard drinks or equivalent per week     Comment: rarely  . Drug Use: No  . Sexual Activity: Not on file   Other Topics Concern  . Not on file   Social History Narrative   Married.   One son, junior in [employer].   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   Enjoys playing on the computer.    No past surgical history on file.  Family History  Problem Relation Age of Onset  . Hypertension Mother   . Diabetes Father   . Diabetes Mother      Allergies  Allergen Reactions  . Ceclor [Cefaclor] Swelling    Current Outpatient Prescriptions on File Prior to Visit  Medication Sig Dispense Refill  . atorvastatin (LIPITOR) 20 MG tablet Take 1 tablet (20 mg total) by mouth daily. 90 tablet 3  . Blood Glucose Monitoring Suppl (ONE TOUCH ULTRA 2) w/Device KIT Use to test blood sugar 2 times daily. 1 each 0  . gabapentin (NEURONTIN) 600 MG tablet Take 1 tablet (600 mg total) by mouth 3 (three) times daily. 90 tablet 3  . glucose blood (ONE TOUCH ULTRA TEST) test strip Use to test blood sugar 2 times daily. 100 each 11  . hydrochlorothiazide (HYDRODIURIL) 25 MG tablet Take 1 tablet (25 mg total) by mouth daily. 30 tablet 1  . Insulin Glargine (LANTUS) 100 UNIT/ML Solostar Pen Inject 15 Units into the skin at bedtime. 15 mL 11  . Lancets Misc. (ONE TOUCH SURESOFT) MISC Use to test blood sugar 2 times daily 100 each 11  . lisinopril (PRINIVIL,ZESTRIL) 40 MG tablet Take 1 tablet (40 mg total) by mouth daily. 30 tablet 3  . metFORMIN (GLUCOPHAGE) 500 MG tablet Take 2 tablets (1,000 mg total) by mouth 2 (two) times daily with a meal. 120 tablet  5  . sildenafil (REVATIO) 20 MG tablet Take 2 tablets by mouth 30 minutes to 1 hour prior to intercourse. 20 tablet 0   No current facility-administered medications on file prior to visit.    BP 138/84 mmHg  Pulse 90  Temp(Src) 98.1 F (36.7 C) (Oral)  Ht '6\' 2"'  (1.88 m)  Wt 318 lb 12.8 oz (144.607 kg)  BMI 40.91 kg/m2  SpO2 96%    Objective:   Physical Exam  Constitutional: He appears well-nourished.  Cardiovascular: Normal rate, regular rhythm and intact distal pulses.   Moderate left lower extremity edema with trace pitting. Negative Homan's sign. 2+ PT and DP's.  Pulmonary/Chest: Effort normal and breath sounds normal.  Skin: Skin is warm and dry.  Obvious venous insufficiency to bilateral lower extremities.          Assessment & Plan:

## 2015-07-16 DIAGNOSIS — I872 Venous insufficiency (chronic) (peripheral): Secondary | ICD-10-CM | POA: Insufficient documentation

## 2015-07-16 NOTE — Assessment & Plan Note (Signed)
Located to bilateral lower extremities as evidenced by discoloration to lower extremities. Suspect extremity edema today secondary to venous insufficiency due to prolonged periods of dependant position at work. Do not suspect CHF at this point.  Discussed importance of elevation of extremities, avoiding stagnation. Also provided warning signs of DVT.   Will allow time for HCTZ to take effect, if no improvement then will need to consider switching to lasix.

## 2015-07-17 ENCOUNTER — Encounter: Payer: Self-pay | Admitting: Primary Care

## 2015-07-22 ENCOUNTER — Telehealth: Payer: Self-pay | Admitting: Primary Care

## 2015-07-22 NOTE — Telephone Encounter (Signed)
What are his typical readings?

## 2015-07-22 NOTE — Telephone Encounter (Signed)
-----   Message from Doreene NestKatherine K Dewayne Severe, NP sent at 07/08/2015  8:11 PM EDT ----- Regarding: Blood Sugars Will you please check on Mr. Renstrom's blood sugars since starting Lantus? What is his lowest number? What is his highest number?

## 2015-07-23 ENCOUNTER — Encounter: Payer: Self-pay | Admitting: Primary Care

## 2015-07-24 ENCOUNTER — Telehealth: Payer: Self-pay | Admitting: Primary Care

## 2015-07-24 ENCOUNTER — Other Ambulatory Visit: Payer: Self-pay | Admitting: Primary Care

## 2015-07-24 DIAGNOSIS — R600 Localized edema: Secondary | ICD-10-CM

## 2015-07-24 MED ORDER — FUROSEMIDE 20 MG PO TABS: 20.0000 mg | ORAL_TABLET | Freq: Every day | ORAL | Status: DC

## 2015-07-24 NOTE — Telephone Encounter (Signed)
Message left for patient to return call.

## 2015-07-24 NOTE — Telephone Encounter (Signed)
Noted. I have been working on this with him and have sent him a my chart message as well. Please ensure he sees his my chart message. Thanks.

## 2015-07-24 NOTE — Telephone Encounter (Signed)
Patient Name: Kenneth Cohen  DOB: November 09, 1965    Initial Comment foot swelling, hurts bad, also on water pill and BP meds, is diabetic    Nurse Assessment  Nurse: Sherilyn CooterHenry, RN, Thurmond ButtsWade Date/Time (Eastern Time): 07/24/2015 9:00:07 AM  Confirm and document reason for call. If symptomatic, describe symptoms. You must click the next button to save text entered. ---Caller states that his left foot is swelling and painful. He rates his pain as 8 on 0-10 scale. He states that he can still walk, but it does hurt. The swelling goes just a little past the ankle. His right foot was swollen, but okay now. He is on a fluid pill, BP meds and a diabetic. He denies chest pain and difficulty breathing. Denies CHF.  Has the patient traveled out of the country within the last 30 days? ---No  Does the patient have any new or worsening symptoms? ---Yes  Will a triage be completed? ---Yes  Related visit to physician within the last 2 weeks? ---No  Does the PT have any chronic conditions? (i.e. diabetes, asthma, etc.) ---Yes  List chronic conditions. ---Diabetes, HTN  Is this a behavioral health or substance abuse call? ---No     Guidelines    Guideline Title Affirmed Question Affirmed Notes  Diabetes - Foot Problems and Questions Severe pain    Final Disposition User   Go to ED Now Sherilyn CooterHenry, RN, Wade    [facility]   Disagree/Comply: Comply

## 2015-07-25 NOTE — Telephone Encounter (Signed)
Please have him increase Lantus to 30 units daily. Yes, he is to continue the Lasix, elevate his legs, and wear his compression hose.

## 2015-07-25 NOTE — Telephone Encounter (Signed)
Pt left v/m requesting cb from [facility].

## 2015-07-25 NOTE — Telephone Encounter (Signed)
Spoken and notified patient of Kate's comments. Patient verbalized understanding. 

## 2015-07-25 NOTE — Telephone Encounter (Signed)
Noted. Spoken to patient and address this issue in another phone note.

## 2015-07-25 NOTE — Telephone Encounter (Signed)
Called and spoken to patient.   Blood sugars reading: (350, 305) - (330, 300) - (345, 305) - (355, 310) - (329, 311) - (330, 290) - (270, 288) - (222, 210)   Patient also wanted to know what he should do regarding his feet swelling. Last weekend, patient had to leave work due to can't stand on feet. Patient stated that still swelling and can not put much weight on it. Patient wants to know does need to take time off due to the swelling. He already picked up the Lasix and taken it this morning. Should he wear the suppose hose and keep his feet up until better?

## 2015-07-29 ENCOUNTER — Encounter: Payer: Self-pay | Admitting: Primary Care

## 2015-07-31 ENCOUNTER — Encounter: Payer: Self-pay | Admitting: Primary Care

## 2015-07-31 ENCOUNTER — Telehealth: Payer: Self-pay | Admitting: Primary Care

## 2015-07-31 ENCOUNTER — Ambulatory Visit (INDEPENDENT_AMBULATORY_CARE_PROVIDER_SITE_OTHER): Payer: BLUE CROSS/BLUE SHIELD | Admitting: Primary Care

## 2015-07-31 ENCOUNTER — Other Ambulatory Visit: Payer: Self-pay | Admitting: Primary Care

## 2015-07-31 VITALS — BP 128/84 | HR 94 | Temp 97.9°F | Ht 74.0 in | Wt 316.8 lb

## 2015-07-31 DIAGNOSIS — R0989 Other specified symptoms and signs involving the circulatory and respiratory systems: Secondary | ICD-10-CM

## 2015-07-31 DIAGNOSIS — I872 Venous insufficiency (chronic) (peripheral): Secondary | ICD-10-CM | POA: Diagnosis not present

## 2015-07-31 NOTE — Progress Notes (Signed)
Pre visit review using our clinic review tool, if applicable. No additional management support is needed unless otherwise documented below in the visit note. 

## 2015-07-31 NOTE — Patient Instructions (Addendum)
Stop Gabapentin for now as it could be contributing to swelling.  Increase lasix to 40 mg twice daily for 3 days, then 2 tablets once daily until swelling improves. Please e-mail me if no improvement in swelling in the next 3-4 days.  Continue to monitor your blood pressure and notify me if you start to notice readings at or above 140/90 consistently.  Stop by the front desk and speak with either Shirlee LimerickMarion or Revonda StandardAllison regarding your blood flow testing.  Increase Lantus to 35 units every night at bedtime.  Follow up in late September for re-evaluation of blood sugars.  It was a pleasure to see you today!

## 2015-07-31 NOTE — Telephone Encounter (Signed)
Noted. Patient has appointment on 07/31/2015.

## 2015-07-31 NOTE — Progress Notes (Signed)
Subjective:    Patient ID: Kenneth Cohen, male    DOB: 1966/01/04, 50 y.o.   MRN: [id number]  HPI  Kenneth Cohen is a 50 year old male who presents today with a chief complaint of lower extremity swelling. He was evaluated several weeks ago for swelling to his left ankle and foot that had been present for several days prior. He was already managed on HCTZ that had been initiated several days prior. His swelling appeared to be related to venous insufficiency so he was encouraged to elevate his legs, wear compression hose, and avoid dependant positions.   He had some improvement with those interventions but with quick return in swelling once he started ambulating. He was then initiated on low dose Lasix, held HCTZ. Again he will experience brief episodes of improvement until he starts ambulating. He's taking the lasix 20 mg tablets once daily without much improvement. Denies calf pain, joint pain, swelling or pain to his toes, swelling to right ankle, fevers, recent injury, shortness of breath. He is managed on gabapentin for diabetic neuropathy.  Review of Systems  Constitutional: Negative for fever.  Respiratory: Negative for shortness of breath.   Cardiovascular: Positive for leg swelling. Negative for chest pain.  Musculoskeletal: Negative for joint swelling and arthralgias.  Skin: Negative for rash and wound.  Neurological: Negative for dizziness and headaches.       Past Medical History  Diagnosis Date  . Hypertension   . Obesity   . Testicular torsion     as a child     Social History   Social History  . Marital Status: Married    Spouse Name: N/A  . Number of Children: N/A  . Years of Education: N/A   Occupational History  . Not on file.   Social History Main Topics  . Smoking status: Former Smoker    Quit date: 01/20/2004  . Smokeless tobacco: Not on file  . Alcohol Use: 0.0 oz/week    0 Standard drinks or equivalent per week     Comment: rarely  . Drug Use: No  .  Sexual Activity: Not on file   Other Topics Concern  . Not on file   Social History Narrative   Married.   One son, junior in [employer].   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   Enjoys playing on the computer.    No past surgical history on file.  Family History  Problem Relation Age of Onset  . Hypertension Mother   . Diabetes Father   . Diabetes Mother     Allergies  Allergen Reactions  . Ceclor [Cefaclor] Swelling    Current Outpatient Prescriptions on File Prior to Visit  Medication Sig Dispense Refill  . atorvastatin (LIPITOR) 20 MG tablet Take 1 tablet (20 mg total) by mouth daily. 90 tablet 3  . Blood Glucose Monitoring Suppl (ONE TOUCH ULTRA 2) w/Device KIT Use to test blood sugar 2 times daily. 1 each 0  . furosemide (LASIX) 20 MG tablet Take 1 tablet (20 mg total) by mouth daily. 30 tablet 0  . gabapentin (NEURONTIN) 600 MG tablet Take 1 tablet (600 mg total) by mouth 3 (three) times daily. 90 tablet 3  . glucose blood (ONE TOUCH ULTRA TEST) test strip Use to test blood sugar 2 times daily. 100 each 11  . Insulin Glargine (LANTUS) 100 UNIT/ML Solostar Pen Inject 15 Units into the skin at bedtime. 15 mL 11  . Lancets Misc. (ONE TOUCH SURESOFT) MISC  Use to test blood sugar 2 times daily 100 each 11  . lisinopril (PRINIVIL,ZESTRIL) 40 MG tablet Take 1 tablet (40 mg total) by mouth daily. 30 tablet 3  . metFORMIN (GLUCOPHAGE) 500 MG tablet Take 2 tablets (1,000 mg total) by mouth 2 (two) times daily with a meal. 120 tablet 5  . sildenafil (REVATIO) 20 MG tablet Take 2 tablets by mouth 30 minutes to 1 hour prior to intercourse. 20 tablet 0   No current facility-administered medications on file prior to visit.    BP 128/84 mmHg  Pulse 94  Temp(Src) 97.9 F (36.6 C) (Oral)  Ht _0  (1.88 m)  Wt 316 lb 12.8 oz (143.7 kg)  BMI 40.66 kg/m2  SpO2 98%    Objective:   Physical Exam  Constitutional: He appears well-nourished.  Neck: Neck supple.    Cardiovascular: Normal rate and regular rhythm.   Noticed decrease in pedal pulses specifically to dorsalis pedis. Obvious venous insufficiency bilaterally. Moderate edema to left ankle and foot, trace pitting. No calf tenderness, swelling, or erythema, negative Homans sign.  Pulmonary/Chest: Effort normal and breath sounds normal.  Musculoskeletal:  No obvious indications for gout.  Skin: Skin is warm.  Discolored from peripheral venous insufficiency, otherwise unremarkable.          Assessment & Plan:

## 2015-07-31 NOTE — Assessment & Plan Note (Signed)
Continued swelling to left lower ankle and foot despite treatment with Lasix, compression hose, elevation. Suspect this could be related to gabapentin use and will have him hold at this point. Increase Lasix to 40 mg twice daily 3 days then 40 mg once daily until swelling has improved. Continue compression hose. Will also send for ABIs to ensure no arterial involvement as pedal pulses have decreased.

## 2015-07-31 NOTE — Telephone Encounter (Signed)
-----   Message from Doreene NestKatherine K Nevaeha Finerty, NP sent at 07/24/2015  4:17 PM EDT ----- Regarding: Lower extremity edema Changed Mr. Fuchs from HCTZ to lasix. How is his swelling?

## 2015-08-01 ENCOUNTER — Ambulatory Visit (HOSPITAL_COMMUNITY)
Admission: RE | Admit: 2015-08-01 | Discharge: 2015-08-01 | Disposition: A | Discharge status: Home / Self Care | Payer: BLUE CROSS/BLUE SHIELD | Source: Ambulatory Visit | Attending: Primary Care | Admitting: Primary Care

## 2015-08-01 ENCOUNTER — Telehealth: Payer: Self-pay | Admitting: Primary Care

## 2015-08-01 DIAGNOSIS — R0989 Other specified symptoms and signs involving the circulatory and respiratory systems: Secondary | ICD-10-CM | POA: Insufficient documentation

## 2015-08-01 DIAGNOSIS — I10 Essential (primary) hypertension: Secondary | ICD-10-CM | POA: Insufficient documentation

## 2015-08-01 NOTE — Telephone Encounter (Signed)
[employer] faxed over disability forms for Mr. Tanksley.   7/13-LVM on pt cell phone to call back so I can get some more information before we can fill out forms.  Forms on [employer] desk

## 2015-08-01 NOTE — Telephone Encounter (Signed)
Closed in error.

## 2015-08-02 NOTE — Telephone Encounter (Signed)
Forms in [employer] INbox for review, completion, and signature.  Return to [employer]

## 2015-08-05 NOTE — Telephone Encounter (Signed)
Completed and placed in Ch[facility] inbox. 

## 2015-08-07 ENCOUNTER — Other Ambulatory Visit: Payer: Self-pay | Admitting: *Deleted

## 2015-08-07 DIAGNOSIS — R600 Localized edema: Secondary | ICD-10-CM

## 2015-08-07 MED ORDER — FUROSEMIDE 20 MG PO TABS: 40.0000 mg | ORAL_TABLET | Freq: Two times a day (BID) | ORAL | Status: DC

## 2015-08-07 NOTE — Telephone Encounter (Signed)
Spoken to patient's wife. They would like to know what else can they do. Is patient suppose to continue taking 2 tablets of Lasix. Patient is improving a little.

## 2015-08-07 NOTE — Telephone Encounter (Signed)
Yes, him continue taking 2 tablets of Lasix twice daily until swelling has resolved. If no significant improvement by Monday next week please have him call or email me.

## 2015-08-07 NOTE — Telephone Encounter (Signed)
Spoken and notified patient of Kenneth Cohen's comments. Patient verbalized understanding.  Patient needs refill of the Lasix.

## 2015-08-08 ENCOUNTER — Telehealth: Payer: Self-pay | Admitting: Primary Care

## 2015-08-08 DIAGNOSIS — M7989 Other specified soft tissue disorders: Secondary | ICD-10-CM

## 2015-08-08 DIAGNOSIS — E1140 Type 2 diabetes mellitus with diabetic neuropathy, unspecified: Secondary | ICD-10-CM

## 2015-08-08 NOTE — Telephone Encounter (Signed)
-----   Message from Doreene NestKatherine K Elowen Debruyn, NP sent at 08/01/2015  7:15 AM EDT ----- Regarding: Blood Sugars and BP What are his blood sugars running? What's his BP running?

## 2015-08-08 NOTE — Telephone Encounter (Signed)
Paperwork faxed 7/18 Copy for pt Copy for file Copy for scan Left message letting pt know paperwork has been faxed and copy here for him

## 2015-08-09 MED ORDER — INSULIN GLARGINE 100 UNIT/ML SOLOSTAR PEN: 35.0000 [IU] | PEN_INJECTOR | Freq: Every day | SUBCUTANEOUS | Status: DC

## 2015-08-09 NOTE — Telephone Encounter (Signed)
Please have patient increase Lantus to 35 units every night at bedtime. We will call him next week to check on blood sugar readings. Any improvement at all in swelling with Lasix?

## 2015-08-09 NOTE — Telephone Encounter (Signed)
Patient called back. Asked about his readings.   Blood sugar has been running still over 200s betwenn 240 to 250.  Blood pressure has been okay, like in the office upper 120 to 130 on the and 80 to 90.  Foot Swelling is still and painful.

## 2015-08-09 NOTE — Telephone Encounter (Signed)
Message left for patient to return my call.  

## 2015-08-09 NOTE — Telephone Encounter (Signed)
Spoken to patient that he is already increased to 35 units since the last time.   Patient stated that not really. It seems like to better at some extend. He tried to go for a walk then it started to swell up again. Patient asked could it be gout or something to that.

## 2015-08-09 NOTE — Telephone Encounter (Signed)
Yes I did mention that this could be a factor in the past and is a good next step for evaluation. I ordered a uric acid test so please schedule him for a lab only appointment at his earliest convenience.

## 2015-08-12 NOTE — Telephone Encounter (Signed)
Spoken and notified patient of Kate's comments. Patient verbalized understanding.  Patient has lab appt on 08/13/2015.

## 2015-08-13 ENCOUNTER — Other Ambulatory Visit (INDEPENDENT_AMBULATORY_CARE_PROVIDER_SITE_OTHER): Payer: BLUE CROSS/BLUE SHIELD

## 2015-08-13 ENCOUNTER — Other Ambulatory Visit: Payer: Self-pay | Admitting: Primary Care

## 2015-08-13 DIAGNOSIS — M7989 Other specified soft tissue disorders: Secondary | ICD-10-CM | POA: Diagnosis not present

## 2015-08-13 DIAGNOSIS — M25473 Effusion, unspecified ankle: Secondary | ICD-10-CM

## 2015-08-13 LAB — URIC ACID: URIC ACID, SERUM: 8.1 mg/dL — AB (ref 4.0–7.8)

## 2015-08-13 MED ORDER — COLCHICINE 0.6 MG PO TABS: ORAL_TABLET | ORAL | 0 refills | Status: DC

## 2015-08-19 ENCOUNTER — Encounter: Payer: Self-pay | Admitting: Primary Care

## 2015-08-19 ENCOUNTER — Other Ambulatory Visit: Payer: Self-pay | Admitting: Primary Care

## 2015-08-19 DIAGNOSIS — M25473 Effusion, unspecified ankle: Secondary | ICD-10-CM

## 2015-08-19 MED ORDER — PREDNISONE 10 MG PO TABS: ORAL_TABLET | ORAL | 0 refills | Status: DC

## 2015-08-22 ENCOUNTER — Telehealth: Payer: Self-pay | Admitting: Primary Care

## 2015-08-22 ENCOUNTER — Ambulatory Visit (INDEPENDENT_AMBULATORY_CARE_PROVIDER_SITE_OTHER)
Admission: RE | Admit: 2015-08-22 | Discharge: 2015-08-22 | Disposition: A | Discharge status: Home / Self Care | Payer: BLUE CROSS/BLUE SHIELD | Source: Ambulatory Visit | Attending: Primary Care | Admitting: Primary Care

## 2015-08-22 ENCOUNTER — Other Ambulatory Visit: Payer: Self-pay | Admitting: Primary Care

## 2015-08-22 DIAGNOSIS — M7989 Other specified soft tissue disorders: Secondary | ICD-10-CM

## 2015-08-22 DIAGNOSIS — E11610 Type 2 diabetes mellitus with diabetic neuropathic arthropathy: Secondary | ICD-10-CM

## 2015-08-22 NOTE — Telephone Encounter (Signed)
Spoke with patient today who reports continued edema to left ankle and foot despite treatment prednisone. Overall no improvement with Lasix, prednisone, elevation, compression hose. Will obtain x-ray of ankle and foot to rule out any abnormality, possible Charcot foot given diabetes. Patient will also need extended leave of FMLA given no improvement with various treatments as he cannot walk or stand on is foot for longer than 30 minutes.  Zella Ball, do you still have this patient's FMLA paperwork? What do we need to do to extend his leave? I'm not sure how much longer he is requesting, do you mine finding out?

## 2015-08-23 ENCOUNTER — Encounter: Payer: Self-pay | Admitting: Primary Care

## 2015-08-23 NOTE — Telephone Encounter (Signed)
Kenneth Cohen I do have his fmla paperwork  I spoke with pt and he stated he was waiting on you to call him today with his xray results.  He would discuss with you about extending his fmla  I put his last fmla paperwork in your IN BOX

## 2015-08-23 NOTE — Telephone Encounter (Signed)
Please notify patient:  He has Charcot Foot which is a complication of the joints and bones in the foot secondary to diabetes. In order to treat this condition he will need to see an orthopedist. I have already set up a referral so someone will contact him soon.  I would like to extend his FMLA out 1 additional month. Will that be okay with him?

## 2015-08-23 NOTE — Addendum Note (Signed)
Addended by: Doreene Nest on: 08/23/2015 12:51 PM   Modules accepted: Orders

## 2015-08-26 NOTE — Telephone Encounter (Signed)
Paperwork faxed Pt aware  Copy for file Copy for pt Copy for scan

## 2015-08-28 ENCOUNTER — Emergency Department (HOSPITAL_COMMUNITY)
Admission: EM | Admit: 2015-08-28 | Discharge: 2015-08-28 | Disposition: A | Discharge status: Home / Self Care | Payer: BLUE CROSS/BLUE SHIELD | Attending: Emergency Medicine | Admitting: Emergency Medicine

## 2015-08-28 ENCOUNTER — Ambulatory Visit (HOSPITAL_COMMUNITY): Payer: BLUE CROSS/BLUE SHIELD | Attending: Emergency Medicine

## 2015-08-28 DIAGNOSIS — R2242 Localized swelling, mass and lump, left lower limb: Secondary | ICD-10-CM | POA: Diagnosis not present

## 2015-08-28 DIAGNOSIS — I10 Essential (primary) hypertension: Secondary | ICD-10-CM | POA: Diagnosis not present

## 2015-08-28 DIAGNOSIS — Z87891 Personal history of nicotine dependence: Secondary | ICD-10-CM | POA: Insufficient documentation

## 2015-08-28 DIAGNOSIS — E1140 Type 2 diabetes mellitus with diabetic neuropathy, unspecified: Secondary | ICD-10-CM | POA: Diagnosis not present

## 2015-08-28 DIAGNOSIS — Z79899 Other long term (current) drug therapy: Secondary | ICD-10-CM | POA: Insufficient documentation

## 2015-08-28 DIAGNOSIS — Z794 Long term (current) use of insulin: Secondary | ICD-10-CM | POA: Diagnosis not present

## 2015-08-28 DIAGNOSIS — Z7984 Long term (current) use of oral hypoglycemic drugs: Secondary | ICD-10-CM | POA: Insufficient documentation

## 2015-08-28 DIAGNOSIS — M7989 Other specified soft tissue disorders: Secondary | ICD-10-CM

## 2015-08-28 DIAGNOSIS — R791 Abnormal coagulation profile: Secondary | ICD-10-CM | POA: Insufficient documentation

## 2015-08-28 DIAGNOSIS — M14672 Charcot's joint, left ankle and foot: Secondary | ICD-10-CM | POA: Insufficient documentation

## 2015-08-28 LAB — CBC WITH DIFFERENTIAL/PLATELET
Basophils Absolute: 0 10*3/uL (ref 0.0–0.1)
Basophils Relative: 0 %
EOS PCT: 3 %
Eosinophils Absolute: 0.2 10*3/uL (ref 0.0–0.7)
HCT: 36.6 % — ABNORMAL LOW (ref 39.0–52.0)
Hemoglobin: 12.1 g/dL — ABNORMAL LOW (ref 13.0–17.0)
LYMPHS ABS: 2 10*3/uL (ref 0.7–4.0)
LYMPHS PCT: 29 %
MCH: 28.8 pg (ref 26.0–34.0)
MCHC: 33.1 g/dL (ref 30.0–36.0)
MCV: 87.1 fL (ref 78.0–100.0)
MONO ABS: 0.6 10*3/uL (ref 0.1–1.0)
Monocytes Relative: 9 %
Neutro Abs: 4.1 10*3/uL (ref 1.7–7.7)
Neutrophils Relative %: 59 %
PLATELETS: 252 10*3/uL (ref 150–400)
RBC: 4.2 MIL/uL — AB (ref 4.22–5.81)
RDW: 13.1 % (ref 11.5–15.5)
WBC: 6.9 10*3/uL (ref 4.0–10.5)

## 2015-08-28 LAB — COMPREHENSIVE METABOLIC PANEL
ALT: 34 U/L (ref 17–63)
AST: 17 U/L (ref 15–41)
Albumin: 3.4 g/dL — ABNORMAL LOW (ref 3.5–5.0)
Alkaline Phosphatase: 94 U/L (ref 38–126)
Anion gap: 10 (ref 5–15)
BILIRUBIN TOTAL: 0.7 mg/dL (ref 0.3–1.2)
BUN: 13 mg/dL (ref 6–20)
CHLORIDE: 99 mmol/L — AB (ref 101–111)
CO2: 25 mmol/L (ref 22–32)
CREATININE: 0.77 mg/dL (ref 0.61–1.24)
Calcium: 9.1 mg/dL (ref 8.9–10.3)
Glucose, Bld: 323 mg/dL — ABNORMAL HIGH (ref 65–99)
Potassium: 4.2 mmol/L (ref 3.5–5.1)
Sodium: 134 mmol/L — ABNORMAL LOW (ref 135–145)
TOTAL PROTEIN: 6.5 g/dL (ref 6.5–8.1)

## 2015-08-28 LAB — PROTIME-INR
INR: 0.93
PROTHROMBIN TIME: 12.5 s (ref 11.4–15.2)

## 2015-08-28 MED ORDER — ENOXAPARIN SODIUM 150 MG/ML ~~LOC~~ SOLN
1.0000 mg/kg | Freq: Once | SUBCUTANEOUS | Status: AC
  Administered 2015-08-28: 140 mg via SUBCUTANEOUS
  Filled 2015-08-28: qty 0.94

## 2015-08-28 NOTE — ED Provider Notes (Signed)
[facility] DEPT Provider Note   CSN: [id number] Arrival date & time: 08/28/15  0014  First Provider Contact:  None       History   Chief Complaint No chief complaint on file.   HPI Kenneth Cohen is a 49 y.o. male.  HPI 35moago was started on water pills by regular doctor, right leg used to be the one that was swelling, now left leg swelling, got differetn kind of water pill but that didn't work. A few weeks ago started on prednisone for possible gout.  Then last week thought had charcot foot and was scheduled to see orthopedic dr.  Now leg started swelling too.  Read about charcot and feeling nervous, trying to stay off of it as much as possible. Trying to keep it elevated.  Had [place] however appears to be arterial [place] showed no abnormality.  When foot first hurting, thinks had low grade fever, that was one month ago, now no fever.  Swelling noted over last few days. Wife became concerned and recommended pt come to ED.   Pain worse in ankle, 4/10/ Been taking advil TID for it.  Past Medical History:  Diagnosis Date  . Diabetic Charcot foot ([place]   . Hypertension   . Obesity   . Testicular torsion    as a child    Patient Active Problem List   Diagnosis Date Noted  . Chronic venous insufficiency 07/16/2015  . Preventative health care 10/02/2014  . Hyperlipidemia 10/02/2014  . Neuropathy associated with endocrine disorder ([place] 10/02/2014  . Essential hypertension 06/12/2014  . Obesity (BMI 30-39.9) 06/12/2014  . Diabetes mellitus type 2 with complications ([place] 071/69/6789   No past surgical history on file.     Home Medications    Prior to Admission medications   Medication Sig Start Date End Date Taking? Authorizing Provider  atorvastatin (LIPITOR) 20 MG tablet Take 1 tablet (20 mg total) by mouth daily. 10/02/14   KPleas Koch NP  Blood Glucose Monitoring Suppl (ONE TOUCH ULTRA 2) w/Device KIT Use to test blood sugar 2 times daily. 07/08/15    KPleas Koch NP  colchicine 0.6 MG tablet Take 2 tablets by mouth on day one, then 1 tablet 12 hours later. Then 1 tablet twice daily for 3-5 days. 08/13/15   KPleas Koch NP  furosemide (LASIX) 20 MG tablet Take 2 tablets (40 mg total) by mouth 2 (two) times daily. 08/07/15   KPleas Koch NP  gabapentin (NEURONTIN) 600 MG tablet Take 1 tablet (600 mg total) by mouth 3 (three) times daily. 04/03/15   KPleas Koch NP  glucose blood (ONE TOUCH ULTRA TEST) test strip Use to test blood sugar 2 times daily. 07/08/15   KPleas Koch NP  Insulin Glargine (LANTUS) 100 UNIT/ML Solostar Pen Inject 35 Units into the skin at bedtime. 08/09/15   KPleas Koch NP  Lancets Misc. (ONE TOUCH SURESOFT) MISC Use to test blood sugar 2 times daily 07/08/15   KPleas Koch NP  lisinopril (PRINIVIL,ZESTRIL) 40 MG tablet Take 1 tablet (40 mg total) by mouth daily. 05/01/15   KPleas Koch NP  metFORMIN (GLUCOPHAGE) 500 MG tablet Take 2 tablets (1,000 mg total) by mouth 2 (two) times daily with a meal. 03/20/15   KPleas Koch NP  predniSONE (DELTASONE) 10 MG tablet Take three tablets for 2 days, then two tablets for 2 days, then one tablet for 2 days. 08/19/15   KBelenda Cruise  Gentry Fitz, NP  sildenafil (REVATIO) 20 MG tablet Take 2 tablets by mouth 30 minutes to 1 hour prior to intercourse. 03/20/15   Pleas Koch, NP    Family History Family History  Problem Relation Age of Onset  . Hypertension Mother   . Diabetes Mother   . Diabetes Father     Social History Social History  Substance Use Topics  . Smoking status: Former Smoker    Quit date: 01/20/2004  . Smokeless tobacco: Not on file  . Alcohol use 0.0 oz/week     Comment: rarely     Allergies   Ceclor [cefaclor]   Review of Systems Review of Systems  Constitutional: Negative for fever.  Respiratory: Negative for shortness of breath.   Cardiovascular: Positive for leg swelling. Negative for chest pain.    Gastrointestinal: Negative for nausea and vomiting.  Musculoskeletal: Positive for arthralgias.  Skin: Positive for color change. Negative for rash and wound.  All other systems reviewed and are negative.    Physical Exam Updated Vital Signs BP 149/95   Pulse 86   Temp 99.1 F (37.3 C) (Oral)   Resp 18   Ht '6\' 6"'  (1.981 m)   Wt (!) 312 lb (141.5 kg)   SpO2 97%   BMI 36.06 kg/m   Physical Exam  Constitutional: He is oriented to person, place, and time. He appears well-developed and well-nourished. No distress.  HENT:  Head: Normocephalic and atraumatic.  Eyes: Conjunctivae and EOM are normal.  Neck: Normal range of motion.  Cardiovascular: Normal rate, regular rhythm and intact distal pulses.   Pulmonary/Chest: Effort normal. No respiratory distress.  Abdominal: There is no guarding.  Musculoskeletal: He exhibits no edema.  Left foot and ankle with swelling, mild overlying erytnema, no cellulitis, normal ROM Swelling LLE>RLE of calf  Neurological: He is alert and oriented to person, place, and time.  Skin: Skin is warm and dry. He is not diaphoretic.  Nursing note and vitals reviewed.    ED Treatments / Results  Labs (all labs ordered are listed, but only abnormal results are displayed) Labs Reviewed  CBC WITH DIFFERENTIAL/PLATELET - Abnormal; Notable for the following:       Result Value   RBC 4.20 (*)    Hemoglobin 12.1 (*)    HCT 36.6 (*)    All other components within normal limits  COMPREHENSIVE METABOLIC PANEL - Abnormal; Notable for the following:    Sodium 134 (*)    Chloride 99 (*)    Glucose, Bld 323 (*)    Albumin 3.4 (*)    All other components within normal limits  PROTIME-INR    EKG  EKG Interpretation None       Radiology No results found.  Procedures Procedures (including critical care time)  Medications Ordered in ED Medications  enoxaparin (LOVENOX) injection 140 mg (140 mg Subcutaneous Given 08/28/15 0645)     Initial  Impression / Assessment and Plan / ED Course  I have reviewed the triage vital signs and the nursing notes.  Pertinent labs & imaging results that were available during my care of the patient were reviewed by me and considered in my medical decision making (see chart for details).  Clinical Course   50yo male with hx of DM, 1 month of left foot pain found to be charcot foot on XR last week presents with concern for worsening swelling of LLE.  No sign of cellulitis, no sign of septic joint, and no fever to indicate  acute osteomyelitis requiring admission. Patient had US arterial done last week, however has not had DVT US and presents today with worsening swelling of the whole left leg. While this may be reactive, feel evaluation with DVT US is indicated to rule out DVT.  Pt without bleeding risk factors and given signs of swelling, gave lovenox empirically and ordered DVT US to be completed when [place] is available at [facility]. Ordered screening labs to evaluate hgb and cr prior to initiating further evaluation. Discussed that pt may wait in ED for testing however he prefers discharge. Patient discharged in stable condition with understanding of reasons to return.   Final Clinical Impressions(s) / ED Diagnoses   Final diagnoses:  Charcot's joint of left foot  Left leg swelling    New Prescriptions Discharge Medication List as of 08/28/2015  6:37 AM       Gareth Morgan, MD 08/28/15 1450

## 2015-08-28 NOTE — ED Notes (Signed)
Pt verbalized understanding of discharge instructions and follow up care

## 2015-08-29 LAB — COMPREHENSIVE METABOLIC PANEL
ALK PHOS: 96 U/L (ref 38–126)
ALT: 35 U/L (ref 17–63)
ANION GAP: 8 (ref 5–15)
AST: 20 U/L (ref 15–41)
Albumin: 3.4 g/dL — ABNORMAL LOW (ref 3.5–5.0)
BUN: 14 mg/dL (ref 6–20)
CALCIUM: 9.2 mg/dL (ref 8.9–10.3)
CO2: 26 mmol/L (ref 22–32)
Chloride: 101 mmol/L (ref 101–111)
Creatinine, Ser: 0.89 mg/dL (ref 0.61–1.24)
GFR calc non Af Amer: >60 mL/min (ref >60)
Glucose, Bld: 305 mg/dL — ABNORMAL HIGH (ref 65–99)
POTASSIUM: 4.3 mmol/L (ref 3.5–5.1)
SODIUM: 135 mmol/L (ref 135–145)
Total Bilirubin: 0.6 mg/dL (ref 0.3–1.2)
Total Protein: 6.7 g/dL (ref 6.5–8.1)

## 2015-08-29 LAB — CBC
HCT: 37.4 % — ABNORMAL LOW (ref 39.0–52.0)
HEMOGLOBIN: 12.6 g/dL — AB (ref 13.0–17.0)
MCH: 29.6 pg (ref 26.0–34.0)
MCHC: 33.7 g/dL (ref 30.0–36.0)
MCV: 88 fL (ref 78.0–100.0)
PLATELETS: 247 10*3/uL (ref 150–400)
RBC: 4.25 MIL/uL (ref 4.22–5.81)
RDW: 13.2 % (ref 11.5–15.5)
WBC: 6.4 10*3/uL (ref 4.0–10.5)

## 2015-08-30 ENCOUNTER — Encounter: Payer: Self-pay | Admitting: Primary Care

## 2015-09-01 ENCOUNTER — Other Ambulatory Visit: Payer: Self-pay | Admitting: Primary Care

## 2015-09-01 ENCOUNTER — Encounter: Payer: Self-pay | Admitting: Primary Care

## 2015-09-01 DIAGNOSIS — I10 Essential (primary) hypertension: Secondary | ICD-10-CM

## 2015-09-01 MED ORDER — HYDROCHLOROTHIAZIDE 25 MG PO TABS: 25.0000 mg | ORAL_TABLET | Freq: Every day | ORAL | 3 refills | Status: DC

## 2015-09-02 ENCOUNTER — Encounter: Payer: Self-pay | Admitting: Primary Care

## 2015-09-02 ENCOUNTER — Other Ambulatory Visit: Payer: Self-pay | Admitting: Primary Care

## 2015-09-02 DIAGNOSIS — I10 Essential (primary) hypertension: Secondary | ICD-10-CM

## 2015-09-02 MED ORDER — LISINOPRIL 40 MG PO TABS
40.0000 mg | ORAL_TABLET | Freq: Every day | ORAL | 3 refills | Status: DC
Start: 2015-09-02 — End: 2016-11-11

## 2015-09-03 ENCOUNTER — Encounter: Payer: Self-pay | Admitting: Primary Care

## 2015-09-03 ENCOUNTER — Telehealth: Payer: Self-pay | Admitting: Primary Care

## 2015-09-03 MED ORDER — TRAMADOL HCL 50 MG PO TABS: 50.0000 mg | ORAL_TABLET | Freq: Two times a day (BID) | ORAL | 0 refills | Status: DC | PRN

## 2015-09-03 NOTE — Telephone Encounter (Signed)
Please call in Tramadol 50 mg tablets. Take 1 tablet by mouth every 12 hours as needed for severe pain. #30, no refills. Patient aware.

## 2015-09-03 NOTE — Telephone Encounter (Signed)
Called in medication to the pharmacy as instructed. 

## 2015-09-07 ENCOUNTER — Encounter (INDEPENDENT_AMBULATORY_CARE_PROVIDER_SITE_OTHER): Payer: Self-pay

## 2015-09-07 DIAGNOSIS — M14672 Charcot's joint, left ankle and foot: Secondary | ICD-10-CM

## 2015-09-07 HISTORY — DX: Charcot's joint, left ankle and foot: M14.672

## 2015-09-24 ENCOUNTER — Other Ambulatory Visit: Payer: Self-pay | Admitting: Internal Medicine

## 2015-09-24 ENCOUNTER — Encounter: Payer: Self-pay | Admitting: Primary Care

## 2015-09-24 DIAGNOSIS — M14672 Charcot's joint, left ankle and foot: Secondary | ICD-10-CM

## 2015-09-27 ENCOUNTER — Other Ambulatory Visit: Payer: Self-pay | Admitting: Primary Care

## 2015-09-27 DIAGNOSIS — E119 Type 2 diabetes mellitus without complications: Secondary | ICD-10-CM

## 2015-09-30 NOTE — Telephone Encounter (Signed)
Pt is calling to check on status of metformin. Pt is completley out. Please call pt when called in.  [facility]

## 2015-09-30 NOTE — Telephone Encounter (Signed)
Spoken to patient and notified refill has been sent.

## 2015-10-02 ENCOUNTER — Other Ambulatory Visit: Payer: Self-pay | Admitting: *Deleted

## 2015-10-02 ENCOUNTER — Encounter: Payer: Self-pay | Admitting: Podiatry

## 2015-10-02 ENCOUNTER — Ambulatory Visit (INDEPENDENT_AMBULATORY_CARE_PROVIDER_SITE_OTHER): Payer: BLUE CROSS/BLUE SHIELD | Admitting: Podiatry

## 2015-10-02 VITALS — BP 119/75 | HR 109 | Resp 18

## 2015-10-02 DIAGNOSIS — M79672 Pain in left foot: Secondary | ICD-10-CM | POA: Diagnosis not present

## 2015-10-02 DIAGNOSIS — E11610 Type 2 diabetes mellitus with diabetic neuropathic arthropathy: Secondary | ICD-10-CM

## 2015-10-02 NOTE — Progress Notes (Signed)
   Subjective:    Patient ID: Kenneth Cohen, male    DOB: 03/12/1965, 50 y.o.   MRN: [id number]  HPI: He presents today with a chief complaint of pain to his left foot and ankle. He states that the pain has been severe for the past 2-1/2 months with swelling and redness. He states that when he steps on the foot he can hear bones crunching and popping. He states that the foot feels very unstable and seems to logroll towards the outside. He has seen 2 different doctors initially his primary care provider who thought it may be gout with a slightly elevated uric acid and an orthopedic doctor who states that it is a Charcot arthropathy placed on prednisone and told him that it will just be this way and that there is nothing that can be done. He denies fever chills nausea vomiting. He states that he has a a year plus history of known diabetes but with neuropathy extending back possibly as long as 10 years. He relates alcohol but does not consider himself alcoholic. He denies trauma to the back and has never had back surgery.    Review of Systems  Cardiovascular: Positive for leg swelling.  Musculoskeletal: Positive for arthralgias, gait problem and myalgias.  Neurological: Positive for numbness.  All other systems reviewed and are negative.      Objective:   Physical Exam: Vital signs are stable alert and oriented 3. No apparent Distress. Presents with a wheelchair today. Pulses are strongly palpable neurologic sensorium is diminished per Semmes-Weinstein monofilament with deep pain receptors are intact. Multiple strength to the right foot is present. It's hard to assess on the left foot. Orthopedic evaluation demonstrates right foot normal left foot appears to be a bag of bones that are rubbing past each other and do not feel as though they are in any kind of position his foot appears to be slightly laterally dislocated from beneath his leg the radiographs taken at the primary care facility demonstrates  ankle joint in good position. No open lesions or wounds are noted. Radiographs do demonstrate what appears to be all fractures to the fourth and fifth metatarsals of the left foot and a complete midfoot or mid tarsal joint dislocation secondary to what appears to be Charcot arthropathy associated with diabetic peripheral neuropathy.        Assessment & Plan:  Diabetic Charcot arthropathy with diabetic peripheral neuropathy left foot.  Plan: Placed him in a Cam Walker today ordered him not to walk on this. Suggested he continue to stay out of work at this point and will appear to be indefinitely until this is consolidated. I expressed to him this could take up to 4 team in 6 months. I do think it would be beneficial to obtain an MRI for evaluation of the left foot and ankle.

## 2015-10-07 ENCOUNTER — Telehealth: Payer: Self-pay | Admitting: Podiatry

## 2015-10-07 NOTE — Telephone Encounter (Signed)
Called pt and left a voicemail letting him know we received a fax from [facility] and that I and Clifton JamesJessica Quintana, RN are working on it and will get it sent back in. Also told the patient to call us back with any further questions.

## 2015-10-07 NOTE — Telephone Encounter (Signed)
Patient called wanting to know if we received any paperwork from [facility] in regards to FMLA? Stated he is scheduled for an MRI. Unsure if the paperwork is for authorization for the MRI?

## 2015-10-13 ENCOUNTER — Encounter: Payer: Self-pay | Admitting: Primary Care

## 2015-10-14 ENCOUNTER — Encounter: Payer: Self-pay | Admitting: Primary Care

## 2015-10-14 ENCOUNTER — Other Ambulatory Visit: Payer: Self-pay | Admitting: Primary Care

## 2015-10-14 DIAGNOSIS — I10 Essential (primary) hypertension: Secondary | ICD-10-CM

## 2015-10-14 MED ORDER — HYDROCHLOROTHIAZIDE 25 MG PO TABS: 25.0000 mg | ORAL_TABLET | Freq: Every day | ORAL | 3 refills | Status: DC

## 2015-10-15 ENCOUNTER — Other Ambulatory Visit: Payer: Self-pay | Admitting: Primary Care

## 2015-10-15 DIAGNOSIS — E118 Type 2 diabetes mellitus with unspecified complications: Secondary | ICD-10-CM

## 2015-10-15 DIAGNOSIS — Z794 Long term (current) use of insulin: Principal | ICD-10-CM

## 2015-10-15 MED ORDER — "INSULIN SYRINGE-NEEDLE U-100 30G X 1/2"" 1 ML MISC": 5 refills | Status: DC

## 2015-10-15 MED ORDER — INSULIN NPH ISOPHANE & REGULAR (70-30) 100 UNIT/ML ~~LOC~~ SUSP: 20.0000 [IU] | Freq: Two times a day (BID) | SUBCUTANEOUS | 11 refills | Status: DC

## 2015-10-16 ENCOUNTER — Ambulatory Visit
Admission: RE | Admit: 2015-10-16 | Discharge: 2015-10-16 | Disposition: A | Discharge status: Home / Self Care | Payer: BLUE CROSS/BLUE SHIELD | Source: Ambulatory Visit | Attending: Podiatry | Admitting: Podiatry

## 2015-10-16 DIAGNOSIS — S93492A Sprain of other ligament of left ankle, initial encounter: Secondary | ICD-10-CM | POA: Insufficient documentation

## 2015-10-16 DIAGNOSIS — S9306XA Dislocation of unspecified ankle joint, initial encounter: Secondary | ICD-10-CM | POA: Insufficient documentation

## 2015-10-16 DIAGNOSIS — Z8781 Personal history of (healed) traumatic fracture: Secondary | ICD-10-CM | POA: Diagnosis not present

## 2015-10-16 DIAGNOSIS — E11610 Type 2 diabetes mellitus with diabetic neuropathic arthropathy: Secondary | ICD-10-CM | POA: Diagnosis not present

## 2015-10-16 DIAGNOSIS — X58XXXA Exposure to other specified factors, initial encounter: Secondary | ICD-10-CM | POA: Insufficient documentation

## 2015-10-16 MED ORDER — "INSULIN SYRINGE-NEEDLE U-100 29G X 5/16"" 1 ML MISC": 5 refills | Status: DC

## 2015-10-17 MED ORDER — "INSULIN SYRINGE-NEEDLE U-100 31G X 5/16"" 1 ML MISC": 5 refills | Status: DC

## 2015-10-23 ENCOUNTER — Other Ambulatory Visit: Payer: Self-pay | Admitting: *Deleted

## 2015-10-23 MED ORDER — "INSULIN SYRINGE-NEEDLE U-100 31G X 5/16"" 1 ML MISC": 11 refills | Status: DC

## 2015-10-28 ENCOUNTER — Encounter: Payer: Self-pay | Admitting: Primary Care

## 2015-10-29 ENCOUNTER — Other Ambulatory Visit: Payer: Self-pay | Admitting: Primary Care

## 2015-10-29 ENCOUNTER — Encounter: Payer: Self-pay | Admitting: Primary Care

## 2015-10-29 DIAGNOSIS — E119 Type 2 diabetes mellitus without complications: Secondary | ICD-10-CM

## 2015-10-29 MED ORDER — METFORMIN HCL 500 MG PO TABS: ORAL_TABLET | ORAL | 1 refills | Status: DC

## 2015-10-30 ENCOUNTER — Encounter: Payer: Self-pay | Admitting: Podiatry

## 2015-10-30 ENCOUNTER — Telehealth: Payer: Self-pay | Admitting: *Deleted

## 2015-10-30 ENCOUNTER — Ambulatory Visit (INDEPENDENT_AMBULATORY_CARE_PROVIDER_SITE_OTHER): Payer: BLUE CROSS/BLUE SHIELD | Admitting: Podiatry

## 2015-10-30 DIAGNOSIS — E11610 Type 2 diabetes mellitus with diabetic neuropathic arthropathy: Secondary | ICD-10-CM | POA: Diagnosis not present

## 2015-10-30 DIAGNOSIS — L97321 Non-pressure chronic ulcer of left ankle limited to breakdown of skin: Secondary | ICD-10-CM | POA: Diagnosis not present

## 2015-10-30 MED ORDER — MUPIROCIN CALCIUM 2 % EX CREA: TOPICAL_CREAM | Freq: Two times a day (BID) | CUTANEOUS | Status: AC

## 2015-10-30 MED ORDER — OXYCODONE-ACETAMINOPHEN 10-325 MG PO TABS: ORAL_TABLET | ORAL | 0 refills | Status: DC

## 2015-10-30 NOTE — Progress Notes (Signed)
Presents today for follow-up of his MRI left foot. He states it seems to be getting worse and that is swelling more and more painful. He states these also noticed a small skin breakdown over the fibular malleolus. He presents today utilizing his wheelchair and Cam Walker.  Objective: Vital signs are stable he is alert and oriented 3 denies fever chills nausea vomiting muscle aches and pains. Denies chest pain or shortness of breath. Evaluation of left foot does demonstrate superficial skin breakdown does not probe deeper purulence no odor epithelization is occurring overlying the fibular malleolus measuring approximately 1 cm total length 5 mm in total with in 1-2 mm in depth. No cellulitis or signs of systemic infection. MRI does demonstrate severe active Charcot foot deformity. His foot is very unstable and will most likely need to be reconstructed.  Assessment: Superficial ulceration. Severe Charcot deformity moderate discomfort left foot.  Plan: At this point I wrote a prescription for Percocet No. 50 one to 2 by mouth every 6-8 hours when necessary pain and I also wrote a prescription for Bactroban ointment to be applied to the wound daily and covered. Should this wound worsen or should he develop signs and symptoms of infection which we discussed thoroughly today he will follow up with the emergency department or our practice. Otherwise I am [facility] and [facility].

## 2015-10-30 NOTE — Telephone Encounter (Addendum)
-----   Message from Kristian CoveyAshley E Prevette, [facility] sent at 10/30/2015  8:18 AM EDT ----- Regarding: Referral to [facility] Patient needs referral to [facility] for consult for foot and ankle reconstruction left. Here is the doctor's info! Thanks!!  Dr. Rich NumberSelene G. Parekh MD Address: [address], [place], [place] [address] Phone: [phone] Fax: [phone]. 10/31/2015-Faxed referral, pt clinicals and demographics to Dr. Rich NumberSelene G. Parekh.

## 2015-11-21 DIAGNOSIS — L97321 Non-pressure chronic ulcer of left ankle limited to breakdown of skin: Secondary | ICD-10-CM

## 2015-11-28 ENCOUNTER — Ambulatory Visit (INDEPENDENT_AMBULATORY_CARE_PROVIDER_SITE_OTHER): Payer: BLUE CROSS/BLUE SHIELD | Admitting: Orthopedic Surgery

## 2015-12-20 ENCOUNTER — Telehealth: Payer: Self-pay

## 2015-12-20 NOTE — Telephone Encounter (Signed)
[facility] physical therapist left v/m requesting verbal [facility] for [facility] 2 x a week for 4 weeks. Also [facility] OT [facility].

## 2015-12-20 NOTE — Telephone Encounter (Signed)
Notified [facility] of Kenneth Cohen's approval of the orders.

## 2015-12-20 NOTE — Telephone Encounter (Signed)
Approved. He underwent amputation of left lower leg for complications of Charcot foot on 12/17/15 through [facility].

## 2015-12-25 ENCOUNTER — Ambulatory Visit: Payer: BLUE CROSS/BLUE SHIELD | Admitting: Primary Care

## 2015-12-26 ENCOUNTER — Telehealth: Payer: Self-pay | Admitting: Primary Care

## 2015-12-26 NOTE — Telephone Encounter (Signed)
Noted and approve

## 2015-12-26 NOTE — Telephone Encounter (Signed)
Spoken and notified Inetta Fermoina of Kate's comments. Inetta Fermoina verbalized understanding.

## 2015-12-26 NOTE — Telephone Encounter (Signed)
Tina from [facility] called - Requesting verbal orders for nursing to come out at least twice a week  and provide education on diabetes and monitor blood pressure.    They will need to provide would care when [facility] gives them the orders.   cb number is [phone]

## 2015-12-30 ENCOUNTER — Encounter: Payer: Self-pay | Admitting: Primary Care

## 2015-12-30 ENCOUNTER — Ambulatory Visit (INDEPENDENT_AMBULATORY_CARE_PROVIDER_SITE_OTHER): Payer: BLUE CROSS/BLUE SHIELD | Admitting: Primary Care

## 2015-12-30 VITALS — BP 110/70 | HR 64 | Temp 98.8°F | Wt 296.2 lb

## 2015-12-30 DIAGNOSIS — Z794 Long term (current) use of insulin: Secondary | ICD-10-CM

## 2015-12-30 DIAGNOSIS — Z23 Encounter for immunization: Secondary | ICD-10-CM

## 2015-12-30 DIAGNOSIS — E118 Type 2 diabetes mellitus with unspecified complications: Secondary | ICD-10-CM

## 2015-12-30 DIAGNOSIS — M14672 Charcot's joint, left ankle and foot: Secondary | ICD-10-CM

## 2015-12-30 MED ORDER — INSULIN NPH ISOPHANE & REGULAR (70-30) 100 UNIT/ML ~~LOC~~ SUSP: 30.0000 [IU] | Freq: Two times a day (BID) | SUBCUTANEOUS | 11 refills | Status: DC

## 2015-12-30 NOTE — Progress Notes (Signed)
Pre visit review using our clinic review tool, if applicable. No additional management support is needed unless otherwise documented below in the visit note. 

## 2015-12-30 NOTE — Assessment & Plan Note (Signed)
Recent A1C 9.9 in late November 2017. He is non compliant to a diabetic diet, ready to make improvements now. Increase Novolin 70/30 to 30 units BID. Information provided again for diabetic diet. Will call him in 3 weeks for sugar readings, if above then will increase Novolin accordingly.

## 2015-12-30 NOTE — Patient Instructions (Signed)
Your blood sugars should be less than 150 fasting and/or two hours after a meal.  We've increased your insulin from 25 units twice daily to 30 units twice daily.  Continue to monitor your blood sugars and I'll call you for those blood sugar numbers after Christmas.  Please don't hesitate to e-mail or call me if you need anything! It was a pleasure to see you today!  Diabetes Mellitus and Food It is important for you to manage your blood sugar (glucose) level. Your blood glucose level can be greatly affected by what you eat. Eating healthier foods in the appropriate amounts throughout the day at about the same time each day will help you control your blood glucose level. It can also help slow or prevent worsening of your diabetes mellitus. Healthy eating may even help you improve the level of your blood pressure and reach or maintain a healthy weight. General recommendations for healthful eating and cooking habits include:  Eating meals and snacks regularly. Avoid going long periods of time without eating to lose weight.  Eating a diet that consists mainly of plant-based foods, such as fruits, vegetables, nuts, legumes, and whole grains.  Using low-heat cooking methods, such as baking, instead of high-heat cooking methods, such as deep frying. Work with your dietitian to make sure you understand how to use the Nutrition Facts information on food labels. How can food affect me? Carbohydrates  Carbohydrates affect your blood glucose level more than any other type of food. Your dietitian will help you determine how many carbohydrates to eat at each meal and teach you how to count carbohydrates. Counting carbohydrates is important to keep your blood glucose at a healthy level, especially if you are using insulin or taking certain medicines for diabetes mellitus. Alcohol  Alcohol can cause sudden decreases in blood glucose (hypoglycemia), especially if you use insulin or take certain medicines for  diabetes mellitus. Hypoglycemia can be a life-threatening condition. Symptoms of hypoglycemia (sleepiness, dizziness, and disorientation) are similar to symptoms of having too much alcohol. If your health care provider has given you approval to drink alcohol, do so in moderation and use the following guidelines:  Women should not have more than one drink per day, and men should not have more than two drinks per day. One drink is equal to:  12 oz of beer.  5 oz of wine.  1 oz of hard liquor.  Do not drink on an empty stomach.  Keep yourself hydrated. Have water, diet soda, or unsweetened iced tea.  Regular soda, juice, and other mixers might contain a lot of carbohydrates and should be counted. What foods are not recommended? As you make food choices, it is important to remember that all foods are not the same. Some foods have fewer nutrients per serving than other foods, even though they might have the same number of calories or carbohydrates. It is difficult to get your body what it needs when you eat foods with fewer nutrients. Examples of foods that you should avoid that are high in calories and carbohydrates but low in nutrients include:  Trans fats (most processed foods list trans fats on the Nutrition Facts label).  Regular soda.  Juice.  Candy.  Sweets, such as cake, pie, doughnuts, and cookies.  Fried foods. What foods can I eat? Eat nutrient-rich foods, which will nourish your body and keep you healthy. The food you should eat also will depend on several factors, including:  The calories you need.  The medicines  you take.  Your weight.  Your blood glucose level.  Your blood pressure level.  Your cholesterol level. You should eat a variety of foods, including:  Protein.  Lean cuts of meat.  Proteins low in saturated fats, such as fish, egg whites, and beans. Avoid processed meats.  Fruits and vegetables.  Fruits and vegetables that may help control blood  glucose levels, such as apples, mangoes, and yams.  Dairy products.  Choose fat-free or low-fat dairy products, such as milk, yogurt, and cheese.  Grains, bread, pasta, and rice.  Choose whole grain products, such as multigrain bread, whole oats, and brown rice. These foods may help control blood pressure.  Fats.  Foods containing healthful fats, such as nuts, avocado, olive oil, canola oil, and fish. Does everyone with diabetes mellitus have the same meal plan? Because every person with diabetes mellitus is different, there is not one meal plan that works for everyone. It is very important that you meet with a dietitian who will help you create a meal plan that is just right for you. This information is not intended to replace advice given to you by your health care provider. Make sure you discuss any questions you have with your health care provider. Document Released: 10/02/2004 Document Revised: 06/13/2015 Document Reviewed: 12/02/2012 Elsevier Interactive Patient Education  2017 [employer]

## 2015-12-30 NOTE — Assessment & Plan Note (Signed)
Underwent amputation of left lower extremity below the knee. Doing much better overall. Continue to follow with amputee clinic.

## 2015-12-30 NOTE — Progress Notes (Signed)
Subjective:    Patient ID: Kenneth Cohen, male    DOB: March 07, 1965, 50 y.o.   MRN: [id number]  HPI  Kenneth Cohen is a 50 year old male who presents today for hospital follow up. He has a history of uncontrolled diabetes, diabetic neuropathy, charcot foot. Charcot foot was diagnosed in late Summer 2017. After numerous unsuccessful attempts to treat lower extremity pain and swelling (Charcot foot) he elected for operative intervention.  He was [facility] on [facility]17[facility]15 and underwent below the knee amputation to the left lower extremity through tibia and fibula. His operation went well and he was discharged on home on 12/18/15. Since his discharge he's followed up with his orthopedist on 12/25/15. He is also following with the amputee clinic for dressing changes and evaluation. He's doing well and is using his wheelchair and walker mostly. He's completing physical therapy exercises as recommended. He denies pain, swelling, stiffness to his left lower extremity.  2) Type 2 Diabetes: Recent A1C of 9.9 through [facility]. A1C prior to that was 8.3. He is currently managed on Novolin 70/30 and is injecting 25 units BID. He was provided with a prescription for Humulin short acting insulin to use before lunch for sugars above 200. He's checking his sugars several times daily. His fasting sugars in the morning run mid 200's on average; sugars in the afternoon (before and after lunch) run 200-300's. His recent A1c was 9.9 on 12/11/15. He's not compliant to a diabetic diet given recent stress. He is ready to work on improvements in his diet now that he's undergone surgery and is no longer struggling with swelling and pain.   Review of Systems  Eyes: Negative for visual disturbance.  Respiratory: Negative for shortness of breath.   Cardiovascular: Negative for chest pain and leg swelling.  Skin: Negative for wound.  Neurological: Negative for dizziness and headaches.  Psychiatric/Behavioral:   Feeling optimistic despite recent surgery.        Past Medical History:  Diagnosis Date  . Diabetic Charcot foot (HCC)   . Hypertension   . Obesity   . Testicular torsion    as a child     Social History   Social History  . Marital status: Married    Spouse name: N/A  . Number of children: N/A  . Years of education: N/A   Occupational History  . Not on file.   Social History Main Topics  . Smoking status: Former Smoker    Quit date: 01/20/2004  . Smokeless tobacco: Not on file  . Alcohol use No     Comment: rarely  . Drug use: No  . Sexual activity: Not on file   Other Topics Concern  . Not on file   Social History Narrative   Married.   One son, junior in [employer].   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   Enjoys playing on the computer.    No past surgical history on file.  Family History  Problem Relation Age of Onset  . Hypertension Mother   . Diabetes Mother   . Diabetes Father     Allergies  Allergen Reactions  . Ceclor [Cefaclor] Swelling    Current Outpatient Prescriptions on File Prior to Visit  Medication Sig Dispense Refill  . gabapentin (NEURONTIN) 600 MG tablet Take 1 tablet (600 mg total) by mouth 3 (three) times daily. 90 tablet 3  . hydrochlorothiazide (HYDRODIURIL) 25 MG tablet Take 1 tablet (25 mg total) by mouth daily. 90  tablet 3  . Insulin Syringe-Needle U-100 31G X 5/16" 1 ML MISC USE TWICE DAILY WITH INSULIN 100 each 11  . lisinopril (PRINIVIL,ZESTRIL) 40 MG tablet Take 1 tablet (40 mg total) by mouth daily. 90 tablet 3  . metFORMIN (GLUCOPHAGE) 500 MG tablet TAKE TWO TABLETS BY MOUTH TWICE DAILY WITH MEALS 120 tablet 1  . oxyCODONE-acetaminophen (PERCOCET) 10-325 MG tablet Take one to two tablets by mouth every six to eight hours as needed for pain. 60 tablet 0  . sildenafil (REVATIO) 20 MG tablet Take 2 tablets by mouth 30 minutes to 1 hour prior to intercourse. 20 tablet 0   Current Facility-Administered Medications on  File Prior to Visit  Medication Dose Route Frequency Provider Last Rate Last Dose  . mupirocin cream (BACTROBAN) 2 %   Topical BID Max T Hyatt, DPM        BP 110/70   Pulse 64   Temp 98.8 F (37.1 C) (Oral)   Wt 296 lb 4 oz (134.4 kg)   BMI 40.18 kg/m    Objective:   Physical Exam  Constitutional: He appears well-nourished.  Neck: Neck supple.  Cardiovascular: Normal rate and regular rhythm.   Pulmonary/Chest: Effort normal and breath sounds normal.  Skin: Skin is warm and dry.  Psychiatric: He has a normal mood and affect.          Assessment & Plan:  Hospital Follow Up:  Admitted to [facility] on 12/17/15 for left BKA for charcot foot. Discharged home the following day. Surgery and hospital recovery went well. Overall doing well, he is feeling much better than prior to his surgery and is optimistic.  Following with ortho and amputee clinic. Has an appointment to have sutures removed soon. Encouraged him to continue with PT exercises, use walker at home when possible.  Hospital notes, labs, reviewed through [facility] Every[facility]. Morrie Sheldonlark,Romaldo Saville Kendal, NP

## 2016-01-08 ENCOUNTER — Encounter: Payer: BLUE CROSS/BLUE SHIELD | Admitting: Primary Care

## 2016-01-10 ENCOUNTER — Emergency Department (HOSPITAL_COMMUNITY)
Admission: EM | Admit: 2016-01-10 | Discharge: 2016-01-10 | Disposition: A | Discharge status: Home / Self Care | Payer: BLUE CROSS/BLUE SHIELD | Attending: Emergency Medicine | Admitting: Emergency Medicine

## 2016-01-10 ENCOUNTER — Encounter (HOSPITAL_COMMUNITY): Payer: Self-pay | Admitting: Emergency Medicine

## 2016-01-10 DIAGNOSIS — E1140 Type 2 diabetes mellitus with diabetic neuropathy, unspecified: Secondary | ICD-10-CM | POA: Insufficient documentation

## 2016-01-10 DIAGNOSIS — Z87891 Personal history of nicotine dependence: Secondary | ICD-10-CM | POA: Insufficient documentation

## 2016-01-10 DIAGNOSIS — Z7982 Long term (current) use of aspirin: Secondary | ICD-10-CM | POA: Insufficient documentation

## 2016-01-10 DIAGNOSIS — Z79899 Other long term (current) drug therapy: Secondary | ICD-10-CM | POA: Diagnosis not present

## 2016-01-10 DIAGNOSIS — Z794 Long term (current) use of insulin: Secondary | ICD-10-CM | POA: Diagnosis not present

## 2016-01-10 DIAGNOSIS — I10 Essential (primary) hypertension: Secondary | ICD-10-CM | POA: Diagnosis present

## 2016-01-10 NOTE — ED Provider Notes (Signed)
[facility]-EMERGENCY DEPT Provider Note   CSN: [id number] Arrival date & time: 01/10/16  1336     History   Chief Complaint Chief Complaint  Patient presents with  . Hypertension  . Anxiety    HPI Kenneth Cohen is a 50 y.o. male.  The history is provided by the patient.  Hypertension  This is a chronic problem. The current episode started yesterday. The problem occurs constantly. The problem has been resolved. Pertinent negatives include no chest pain, no headaches and no shortness of breath. The symptoms are aggravated by stress. Nothing relieves the symptoms. He has tried nothing for the symptoms.  Anxiety  Pertinent negatives include no chest pain, no headaches and no shortness of breath.    Past Medical History:  Diagnosis Date  . Diabetic Charcot foot (HCC)   . Hypertension   . Obesity   . Testicular torsion    as a child    Patient Active Problem List   Diagnosis Date Noted  . Charcot's joint, left ankle and foot 09/07/2015  . Chronic venous insufficiency 07/16/2015  . Preventative health care 10/02/2014  . Hyperlipidemia 10/02/2014  . Neuropathy associated with endocrine disorder (HCC) 10/02/2014  . Essential hypertension 06/12/2014  . Obesity (BMI 30-39.9) 06/12/2014  . Diabetes mellitus type 2 with complications (HCC) 06/12/2014    History reviewed. No pertinent surgical history.     Home Medications    Prior to Admission medications   Medication Sig Start Date End Date Taking? Authorizing Provider  Aspirin-Salicylamide-Caffeine (BC HEADACHE PO) Take 1 packet by mouth daily.   Yes Historical Provider, MD  Cholecalciferol (VITAMIN D3) 5000 units CAPS Take 5,000 Units by mouth daily.    Yes Historical Provider, MD  hydrochlorothiazide (HYDRODIURIL) 25 MG tablet Take 1 tablet (25 mg total) by mouth daily. 10/14/15  Yes Doreene NestKatherine K Clark, NP  insulin NPH-regular Human (NOVOLIN 70/30) (70-30) 100 UNIT/ML injection Inject 30 Units into the skin 2 (two) times  daily with a meal. 12/30/15  Yes Doreene NestKatherine K Clark, NP  lisinopril (PRINIVIL,ZESTRIL) 40 MG tablet Take 1 tablet (40 mg total) by mouth daily. 09/02/15  Yes Doreene NestKatherine K Clark, NP  metFORMIN (GLUCOPHAGE) 500 MG tablet Take 1,000 mg by mouth 2 (two) times daily with a meal.   Yes Historical Provider, MD  oxyCODONE-acetaminophen (PERCOCET) 10-325 MG tablet Take 1-2 tablets by mouth every 6 (six) hours as needed for pain.   Yes Historical Provider, MD  sildenafil (REVATIO) 20 MG tablet Take 40 mg by mouth as needed (30 minutes to 1 hour before intercourse).   Yes Historical Provider, MD    Family History Family History  Problem Relation Age of Onset  . Hypertension Mother   . Diabetes Mother   . Diabetes Father     Social History Social History  Substance Use Topics  . Smoking status: Former Smoker    Quit date: 01/20/2004  . Smokeless tobacco: Not on file  . Alcohol use No     Comment: rarely     Allergies   Ceclor [cefaclor]   Review of Systems Review of Systems  Respiratory: Negative for shortness of breath.   Cardiovascular: Negative for chest pain.  Neurological: Negative for headaches.  All other systems reviewed and are negative.    Physical Exam Updated Vital Signs BP 135/98 (BP Location: Left Arm)   Pulse 104   Temp 98.1 F (36.7 C) (Oral)   Resp 18   SpO2 100%   Physical Exam  Constitutional: He is oriented  to person, place, and time. He appears well-developed and well-nourished. No distress.  HENT:  Head: Normocephalic and atraumatic.  Nose: Nose normal.  Eyes: Conjunctivae are normal.  Neck: Neck supple. No tracheal deviation present.  Cardiovascular: Normal rate, regular rhythm and normal heart sounds.   Pulmonary/Chest: Effort normal and breath sounds normal. No respiratory distress.  Abdominal: Soft. He exhibits no distension.  Neurological: He is alert and oriented to person, place, and time. No cranial nerve deficit.  Skin: Skin is warm and dry.    Psychiatric: He has a normal mood and affect.     ED Treatments / Results  Labs (all labs ordered are listed, but only abnormal results are displayed) Labs Reviewed - No data to display  EKG  EKG Interpretation None       Radiology No results found.  Procedures Procedures (including critical care time)  Medications Ordered in ED Medications - No data to display   Initial Impression / Assessment and Plan / ED Course  I have reviewed the triage vital signs and the nursing notes.  Pertinent labs & imaging results that were available during my care of the patient were reviewed by me and considered in my medical decision making (see chart for details).  Clinical Course     50 y.o. male presents with concern that his blood pressure was running high today. He is normotensive here. Reassured that he may need medication dosing adjustment but no acute intervention indicated here. Plan to follow up with PCP as needed and return precautions discussed for worsening or new concerning symptoms.   Final Clinical Impressions(s) / ED Diagnoses   Final diagnoses:  Essential hypertension    New Prescriptions New Prescriptions   No medications on file     Lyndal Pulleyaniel Carmyn Hamm, MD 01/11/16 [phone]

## 2016-01-10 NOTE — ED Triage Notes (Signed)
C/o HTN, ranges from 170/120. On HCTZ, and lisinopril. States he's under a lot of stress from losing his house, moving in with his sister, christmas, and having left leg amputation recently.

## 2016-01-10 NOTE — ED Notes (Signed)
Knott at bedside.

## 2016-01-17 ENCOUNTER — Encounter: Payer: Self-pay | Admitting: Primary Care

## 2016-07-01 ENCOUNTER — Telehealth: Payer: Self-pay | Admitting: Primary Care

## 2016-07-01 NOTE — Telephone Encounter (Signed)
Please notify patient that he's due for a follow up office visit for diabetes and hypertension. I see [facility]. Please schedule a follow up visit once he's discharged.

## 2016-07-02 NOTE — Telephone Encounter (Signed)
Message left for patient to return my call.  

## 2016-07-06 NOTE — Telephone Encounter (Signed)
Patient called me back and stated that he will make appointment as soon as he can. Patient does not have insurance right now but when he will see what he can do soon.

## 2016-07-06 NOTE — Telephone Encounter (Addendum)
Noted. A1C was 9.9 during his hospital visit. We need to discuss diabetes management.

## 2016-07-29 ENCOUNTER — Telehealth: Payer: Self-pay | Admitting: Primary Care

## 2016-07-29 DIAGNOSIS — Z794 Long term (current) use of insulin: Principal | ICD-10-CM

## 2016-07-29 DIAGNOSIS — E118 Type 2 diabetes mellitus with unspecified complications: Secondary | ICD-10-CM

## 2016-07-29 NOTE — Telephone Encounter (Signed)
If he's out of the Novolin then yes it's okay to use the Lantus. He can start with 20 units at bedtime for now. I'm going to forward this message to BicknellAllison. [name], this patient is having trouble affording medications, can you give him a call to see if there's anything we can do to help? Any resources?

## 2016-07-29 NOTE — Telephone Encounter (Signed)
Spoken to patient. He stated that he has plenty of Novolin but he does have some Lantus left at home.  Could patient take it for a couple weeks until he has money to pick up the Novolin.  Patient is aware that he needs to come in but does not have insurance/ money right now.

## 2016-07-29 NOTE — Telephone Encounter (Signed)
Spoken and notified patient of Kate's comments. Patient verbalized understanding.  Patient stated that he will try to schedule an office visit after 25th.  He still on antibiotics because he had an infection.  Patient stated that for the past month his sugar have been under 200.

## 2016-07-29 NOTE — Telephone Encounter (Signed)
Patient would like a return call concerning his insulin.

## 2016-07-29 NOTE — Telephone Encounter (Signed)
Noted  

## 2016-08-11 NOTE — Telephone Encounter (Signed)
I'm getting some info on this-will keep you updated.

## 2016-08-11 NOTE — Telephone Encounter (Signed)
Great, thanks

## 2016-08-13 NOTE — Telephone Encounter (Signed)
Here's what I found out..If its just Novolin R or N they sell vials of insulin [facility] for $25 OTC. This can still be really expensive depending on how much the patient is using. Otherwise, they wont qualify for the patient assistance program through [employer] with insurance.  If they are uninsured they can almost always apply through the manufacturer to get into their patient assistance programs (PAPs) and get free drug shipped to their house  I spoke to the pt. I am going to send him a MyChart message with the links attached.   Jae DireKate, can you please put in referral for Diabetes Education?? I don't know what it will cost at [facility] but that is where I mentioned to him. Once they tell him how much it will be, he will make his decision. Also, since [facility] has DM Education, they might be a better option since he currently does not have insurance.   Pt is going to call his pharmacist and ask questions he had on how you use Lantus Vs. Novolin.

## 2016-08-14 NOTE — Addendum Note (Signed)
Addended by: Doreene NestLARK, Md Smola K on: 08/14/2016 07:12 AM   Modules accepted: Orders

## 2016-08-14 NOTE — Telephone Encounter (Signed)
Kenneth Cohen, thank you so very much for your hard work, I appreciate you! I've placed the referral for diabetes education through [facility] also mentioning that he is without insurance. Let me know what else you need from my end.

## 2016-08-19 ENCOUNTER — Telehealth: Payer: Self-pay | Admitting: Primary Care

## 2016-08-19 ENCOUNTER — Encounter: Payer: Self-pay | Admitting: Family Medicine

## 2016-08-19 ENCOUNTER — Ambulatory Visit (INDEPENDENT_AMBULATORY_CARE_PROVIDER_SITE_OTHER): Payer: Self-pay | Admitting: Family Medicine

## 2016-08-19 VITALS — BP 170/100 | HR 85 | Ht 74.0 in | Wt 305.0 lb

## 2016-08-19 DIAGNOSIS — I10 Essential (primary) hypertension: Secondary | ICD-10-CM

## 2016-08-19 DIAGNOSIS — F4322 Adjustment disorder with anxiety: Secondary | ICD-10-CM

## 2016-08-19 MED ORDER — METFORMIN HCL 500 MG PO TABS: 1000.0000 mg | ORAL_TABLET | Freq: Two times a day (BID) | ORAL | 3 refills | Status: DC

## 2016-08-19 MED ORDER — SERTRALINE HCL 50 MG PO TABS: 50.0000 mg | ORAL_TABLET | Freq: Every day | ORAL | 3 refills | Status: DC

## 2016-08-19 NOTE — Telephone Encounter (Signed)
Spoken to patient earlier today.  Notified patient of Kate's comments.  Patient stated that he just recently going to [facility].  Patient stated that he has been checking his sugar and it has been running in 100s to 200s.  Patient have not been taking Novolin 70/30. Patient have been taking Lantus 30 units at bedtime.

## 2016-08-19 NOTE — Progress Notes (Signed)
Subjective:   Patient ID: Kenneth Cohen, male    DOB: 06-Jul-1965, 51 y.o.   MRN: [id number]  Kenneth Cohen is a pleasant 51 y.o. year old male patient of Kenneth Cohen, new to me, who presents to clinic today with Blood Pressure Check  on 08/19/2016  HPI:  Complicated medical history-  Been followed by surgery for recent abscess- S/P I&D of left leg stump DOS 6/12 - healing well with negative pressure wound vac therapy and has completed course of IV abx recommended by ID.   BP Readings from Last 3 Encounters:  08/19/16 (!) 170/100  01/10/16 135/98  12/30/15 110/70   Was supposed to be taking Lisinopril 40 mg daily and HCTZ 25 mg daily.  Admits to not taking them in months.  Just restarted taking them 5 days ago [facility] said his BP was elevated.  Has no HA, blurred vision, CP or SOB.  Admits to having a lot of stress lately.  Has lost his home and his job. He does feel anxious. Denies feeling depressed.  No SI or HI.  Current Outpatient Prescriptions on File Prior to Visit  Medication Sig Dispense Refill  . Cholecalciferol (VITAMIN D3) 5000 units CAPS Take 5,000 Units by mouth daily.     . hydrochlorothiazide (HYDRODIURIL) 25 MG tablet Take 1 tablet (25 mg total) by mouth daily. 90 tablet 3  . insulin NPH-regular Human (NOVOLIN 70/30) (70-30) 100 UNIT/ML injection Inject 30 Units into the skin 2 (two) times daily with a meal. 10 mL 11  . lisinopril (PRINIVIL,ZESTRIL) 40 MG tablet Take 1 tablet (40 mg total) by mouth daily. 90 tablet 3  . metFORMIN (GLUCOPHAGE) 500 MG tablet Take 1,000 mg by mouth 2 (two) times daily with a meal.    . sildenafil (REVATIO) 20 MG tablet Take 40 mg by mouth as needed (30 minutes to 1 hour before intercourse).     Current Facility-Administered Medications on File Prior to Visit  Medication Dose Route Frequency Provider Last Rate Last Dose  . mupirocin cream (BACTROBAN) 2 %   Topical BID Hyatt, Max T, DPM        Allergies    Allergen Reactions  . Ceclor [Cefaclor] Itching, Swelling and Other (See Comments)    Reaction:  All over body swelling     Past Medical History:  Diagnosis Date  . Diabetic Charcot foot (HCC)   . Hypertension   . Obesity   . Testicular torsion    as a child    No past surgical history on file.  Family History  Problem Relation Age of Onset  . Hypertension Mother   . Diabetes Mother   . Diabetes Father     Social History   Social History  . Marital status: Married    Spouse name: N/A  . Number of children: N/A  . Years of education: N/A   Occupational History  . Not on file.   Social History Main Topics  . Smoking status: Former Smoker    Quit date: 01/20/2004  . Smokeless tobacco: Never Used  . Alcohol use No     Comment: rarely  . Drug use: No  . Sexual activity: Not on file   Other Topics Concern  . Not on file   Social History Narrative   Married.   One son, junior in [employer].   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   Enjoys playing on the computer.   The PMH, PSH,  Social History, Family History, Medications, and allergies [facility], and have been updated if relevant.  Review of Systems  HENT: Negative.   Respiratory: Negative.   Cardiovascular: Negative.   Psychiatric/Behavioral: Negative for agitation, behavioral problems, confusion, decreased concentration, dysphoric mood, hallucinations and sleep disturbance. The patient is nervous/anxious. The patient is not hyperactive.   All other systems reviewed and are negative.      Objective:    BP (!) 170/100   Pulse 85   Ht 6\' 2"  (1.88 m)   Wt (!) 305 lb (138.3 kg)   SpO2 99%   BMI 39.16 kg/m    Physical Exam  Constitutional: He is oriented to person, place, and time. He appears well-developed and well-nourished.  In wheelchair, left BKA stump wrapped  HENT:  Head: Normocephalic.  Eyes: Conjunctivae are normal.  Cardiovascular: Normal rate, regular rhythm and  normal heart sounds.   Pulmonary/Chest: Effort normal and breath sounds normal.  Neurological: He is alert and oriented to person, place, and time. No cranial nerve deficit.  Skin: Skin is warm and dry.  Psychiatric: He has a normal mood and affect. His behavior is normal. Judgment and thought content normal.  Nursing note and vitals reviewed.         Assessment & Plan:   Essential hypertension  Adjustment disorder with anxiety No Follow-up on file.

## 2016-08-19 NOTE — Assessment & Plan Note (Signed)
Deteriorated but just restarted his BP medication. Also under more stress lately. >25 minutes spent in face to face time with patient, >50% spent in counselling or coordination of care Agreed to continue current dose of BP medication- give it a full two weeks to get into his system.  HH RN will call us with update.  Will start SSRI for anxiety.   See below.

## 2016-08-19 NOTE — Telephone Encounter (Signed)
Noted  

## 2016-08-19 NOTE — Addendum Note (Signed)
Addended by: Dianne DunARON, Mohd. Derflinger M on: 08/19/2016 07:55 AM   Modules accepted: Orders

## 2016-08-19 NOTE — Patient Instructions (Signed)
Nice to meet you, Kenneth Cohen. We are starting zoloft 50 mg daily- ok to take 1/2 tablet (25 mg daily) for 10 days and then increase to full dose. Please keep me updated with your anxiety and blood pressure readings.

## 2016-08-19 NOTE — Telephone Encounter (Signed)
Please notify patient that I reviewed his visit with Dr. Dayton MartesAron this morning (appreicate her assessment and treatment). Have him monitor his blood pressure daily for the next 2 weeks, we will call for readings at that time.  Did he hear back from anyone regarding insulin coverage? Looks like [employer] sent him a message with information for discounted insulin.   What are his blood sugars running?  Is he using the Novolin 70/30, 30 units BID?

## 2016-08-19 NOTE — Assessment & Plan Note (Signed)
New- Start Zoloft 50 mg. Patient is to take 1/2 tablet daily for 10 days, then advance to 1 full tablet thereafter. We discussed possible side effects of headache, GI upset, drowsiness, and SI/HI. If thoughts of SI/HI develop, we discussed to present to the emergency immediately. Patient verbalized understanding.    

## 2016-08-25 ENCOUNTER — Telehealth: Payer: Self-pay

## 2016-08-25 NOTE — Telephone Encounter (Signed)
Approved. Is he taking both Lisinopril 40 mg and HCTZ 25 mg? Please have her call us with BP readings each times she visits. Have patient keep a log of home readings.

## 2016-08-25 NOTE — Telephone Encounter (Signed)
AnimatorAmber nurse with [facility] left v/m requesting cb with verbal [facility] for [facility] 1 x a week for 3 weeks to let med for BP get regulated. pts BP today was 148/110; no symptoms other than stress and anxiety. Amber request cb.

## 2016-08-26 NOTE — Telephone Encounter (Signed)
Spoken and notified Amber of Kate's comments. Amber verbalized understanding.

## 2016-08-28 ENCOUNTER — Other Ambulatory Visit: Payer: Self-pay | Admitting: Primary Care

## 2016-08-28 DIAGNOSIS — E118 Type 2 diabetes mellitus with unspecified complications: Secondary | ICD-10-CM

## 2016-08-28 DIAGNOSIS — Z794 Long term (current) use of insulin: Principal | ICD-10-CM

## 2016-08-28 MED ORDER — METFORMIN HCL 500 MG PO TABS: 1000.0000 mg | ORAL_TABLET | Freq: Two times a day (BID) | ORAL | 0 refills | Status: DC

## 2016-09-02 ENCOUNTER — Telehealth: Payer: Self-pay | Admitting: Primary Care

## 2016-09-02 NOTE — Telephone Encounter (Signed)
Message left for patient to return my call.  

## 2016-09-02 NOTE — Telephone Encounter (Signed)
-----   Message from Doreene NestKatherine K Brande Uncapher, NP sent at 08/19/2016  1:53 PM EDT ----- Regarding: BP Please check on BP readings since he restarted his medications.

## 2016-09-04 NOTE — Telephone Encounter (Signed)
BP improving. Keep follow up appt for 8/22

## 2016-09-04 NOTE — Telephone Encounter (Signed)
Spoken to patient and BP is getting better.  09/01/2016 - 152/111  09/02/2016 - 150/107, 158/98  09/03/2016 - 122/89, 138/90  09/04/2016 - 137/89  Patient does have follow up on this coming Wednesday 09/09/2016

## 2016-09-08 NOTE — Telephone Encounter (Signed)
Noted  

## 2016-09-09 ENCOUNTER — Ambulatory Visit (INDEPENDENT_AMBULATORY_CARE_PROVIDER_SITE_OTHER): Payer: Self-pay | Admitting: Primary Care

## 2016-09-09 VITALS — BP 140/96 | HR 85 | Temp 98.1°F | Wt 302.0 lb

## 2016-09-09 DIAGNOSIS — F4322 Adjustment disorder with anxiety: Secondary | ICD-10-CM

## 2016-09-09 DIAGNOSIS — Z794 Long term (current) use of insulin: Secondary | ICD-10-CM

## 2016-09-09 DIAGNOSIS — E118 Type 2 diabetes mellitus with unspecified complications: Secondary | ICD-10-CM

## 2016-09-09 DIAGNOSIS — I10 Essential (primary) hypertension: Secondary | ICD-10-CM

## 2016-09-09 DIAGNOSIS — E785 Hyperlipidemia, unspecified: Secondary | ICD-10-CM

## 2016-09-09 LAB — BASIC METABOLIC PANEL
BUN: 29 mg/dL — ABNORMAL HIGH (ref 6–23)
CALCIUM: 10.2 mg/dL (ref 8.4–10.5)
CO2: 28 mEq/L (ref 19–32)
Chloride: 103 mEq/L (ref 96–112)
Creatinine, Ser: 1.17 mg/dL (ref 0.40–1.50)
GFR: 69.99 mL/min (ref >60.00)
Glucose, Bld: 191 mg/dL — ABNORMAL HIGH (ref 70–99)
POTASSIUM: 4.9 meq/L (ref 3.5–5.1)
SODIUM: 138 meq/L (ref 135–145)

## 2016-09-09 LAB — HEMOGLOBIN A1C: HEMOGLOBIN A1C: 7.5 % — AB (ref 4.6–6.5)

## 2016-09-09 MED ORDER — AMLODIPINE BESYLATE 10 MG PO TABS
10.0000 mg | ORAL_TABLET | Freq: Every day | ORAL | 0 refills | Status: DC
Start: 2016-09-09 — End: 2017-03-19

## 2016-09-09 NOTE — Assessment & Plan Note (Signed)
No notable difference. Will have him continue for now given that he's only been taking for 2-3 weeks. Denies SI/HI.

## 2016-09-09 NOTE — Assessment & Plan Note (Signed)
Not fasting today. Check lipids during next visit in 2 weeks.

## 2016-09-09 NOTE — Assessment & Plan Note (Signed)
Uncontrolled. Check A1C today. Just restarted Novolin 70/30 at 35 units BID, continue same. He would be best suited for Lantus and Novolog/Humalog but cannot afford this regimen. Continue Metformin, check renal function today. He will start with the diabetes program at [facility] this week, encouraged him to follow up. Will recheck sugars with logs during next visit.

## 2016-09-09 NOTE — Assessment & Plan Note (Signed)
Above goal today despite re-initiation of lisinopril. Also with history of elevated readings on both lisinopril and HCTZ. Given uncontrolled diabetes and hypertension, will add in additional medication. Rx for Amlodipine sent to pharmacy. Continue lisinopril and HCTZ. BMP pending today. Follow up in 2-3 weeks for BP check.

## 2016-09-09 NOTE — Progress Notes (Signed)
Subjective:    Patient ID: Kenneth Cohen, male    DOB: 1965/07/27, 51 y.o.   MRN: [id number]  HPI  Kenneth Cohen is a 51 year old male who presents today for follow up.  1) Essential Hypertension: Currently managed on HCTZ 25 mg and Lisinopril 40 mg. He ran out of the lisinopril for 1-2 months and re-started this thee weeks ago. He did not run out of the HCTZ as he was taking a family member's medication. He's not checking his BP at home. He denies chest pain, dizziness, shortness of breath.   BP Readings from Last 3 Encounters:  09/09/16 (!) 140/96  08/19/16 (!) 170/100  01/10/16 135/98     2) Type 2 Diabetes: Currently managed on Novolin 70/30, 35 units twice daily. He ran out of this and had been using Lantus 30 units HS for the past 1 month. He did restart the Novolin 2-3 days ago. He's also managed on Metformin 1000 mg BID. He has recently reduced his consumption of breads and sweets, ate a sausage biscuit from [place] this morning.  He's checking his sugars three times daily (fasting AM, prior to dinner, at bedtime).  Fasting AM: 140-180 Prior to Dinner: low 200's Bedtime: 160-low 200's.   Highest reading since last visit: 300's Lowest reading: 100  He is enrolled in the Diabetes Program at [facility] and is scheduled to start later this week. His last A1C was 10 in June 2018 during a hospital stay at [facility].  3) Anxiety: Currently managed on sertraline 50 which was initiated in early August 2018 for increased stress as he's had financial difficulties. Overall he's not noticed much of a difference, but has been compliant. He denies SI/HI.  Review of Systems  Constitutional: Negative for fatigue.  Eyes: Negative for visual disturbance.  Respiratory: Negative for shortness of breath.   Cardiovascular: Negative for chest pain.  Neurological: Negative for numbness.  Psychiatric/Behavioral: Negative for suicidal ideas.       See HPI       Past Medical History:    Diagnosis Date  . Diabetic Charcot foot (HCC)   . Hypertension   . Obesity   . Testicular torsion    as a child     Social History   Social History  . Marital status: Married    Spouse name: N/A  . Number of children: N/A  . Years of education: N/A   Occupational History  . Not on file.   Social History Main Topics  . Smoking status: Former Smoker    Quit date: 01/20/2004  . Smokeless tobacco: Never Used  . Alcohol use No     Comment: rarely  . Drug use: No  . Sexual activity: Not on file   Other Topics Concern  . Not on file   Social History Narrative   Married.   One son, junior in [employer].   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   Enjoys playing on the computer.    No past surgical history on file.  Family History  Problem Relation Age of Onset  . Hypertension Mother   . Diabetes Mother   . Diabetes Father     Allergies  Allergen Reactions  . Ceclor [Cefaclor] Itching, Swelling and Other (See Comments)    Reaction:  All over body swelling     Current Outpatient Prescriptions on File Prior to Visit  Medication Sig Dispense Refill  . hydrochlorothiazide (HYDRODIURIL) 25 MG tablet Take 1 tablet (25  mg total) by mouth daily. 90 tablet 3  . insulin NPH-regular Human (NOVOLIN 70/30) (70-30) 100 UNIT/ML injection Inject 30 Units into the skin 2 (two) times daily with a meal. (Patient taking differently: Inject 35 Units into the skin 2 (two) times daily with a meal. ) 10 mL 11  . lisinopril (PRINIVIL,ZESTRIL) 40 MG tablet Take 1 tablet (40 mg total) by mouth daily. 90 tablet 3  . metFORMIN (GLUCOPHAGE) 500 MG tablet Take 2 tablets (1,000 mg total) by mouth 2 (two) times daily with a meal. 120 tablet 0  . sertraline (ZOLOFT) 50 MG tablet Take 1 tablet (50 mg total) by mouth daily. 30 tablet 3  . sildenafil (REVATIO) 20 MG tablet Take 40 mg by mouth as needed (30 minutes to 1 hour before intercourse).     Current Facility-Administered Medications on File  Prior to Visit  Medication Dose Route Frequency Provider Last Rate Last Dose  . mupirocin cream (BACTROBAN) 2 %   Topical BID Hyatt, Max T, DPM        BP (!) 140/96   Pulse 85   Temp 98.1 F (36.7 C) (Oral)   Wt (!) 302 lb (137 kg)   SpO2 99%   BMI 38.77 kg/m    Objective:   Physical Exam  Constitutional: He appears well-nourished.  Neck: Neck supple.  Cardiovascular: Normal rate and regular rhythm.   Pulmonary/Chest: Effort normal and breath sounds normal.  Skin: Skin is warm and dry.  Psychiatric: He has a normal mood and affect.          Assessment & Plan:

## 2016-09-09 NOTE — Patient Instructions (Addendum)
Start amlodipine 10 mg tablets for high blood pressure. Take 1 tablet by mouth every morning.  Continue lisinopril and hydrochlorothiazide for blood pressure.  Continue Novolin 70/30 insulin, inject 35 units twice daily. Keep track of your sugars as we will call you for your readings and bring them in 2 weeks.  Please attend the diabetes program at [facility]. Please call me if they make any adjustments to your insulin.  Complete lab work prior to leaving today. I will notify you of your results once received.   Schedule a follow up visit in 2-3 weeks to recheck your blood pressure and blood sugars.  It was a pleasure to see you today!  Diabetes Mellitus and Food It is important for you to manage your blood sugar (glucose) level. Your blood glucose level can be greatly affected by what you eat. Eating healthier foods in the appropriate amounts throughout the day at about the same time each day will help you control your blood glucose level. It can also help slow or prevent worsening of your diabetes mellitus. Healthy eating may even help you improve the level of your blood pressure and reach or maintain a healthy weight. General recommendations for healthful eating and cooking habits include:  Eating meals and snacks regularly. Avoid going long periods of time without eating to lose weight.  Eating a diet that consists mainly of plant-based foods, such as fruits, vegetables, nuts, legumes, and whole grains.  Using low-heat cooking methods, such as baking, instead of high-heat cooking methods, such as deep frying.  Work with your dietitian to make sure you understand how to use the Nutrition Facts information on food labels. How can food affect me? Carbohydrates Carbohydrates affect your blood glucose level more than any other type of food. Your dietitian will help you determine how many carbohydrates to eat at each meal and teach you how to count carbohydrates. Counting carbohydrates  is important to keep your blood glucose at a healthy level, especially if you are using insulin or taking certain medicines for diabetes mellitus. Alcohol Alcohol can cause sudden decreases in blood glucose (hypoglycemia), especially if you use insulin or take certain medicines for diabetes mellitus. Hypoglycemia can be a life-threatening condition. Symptoms of hypoglycemia (sleepiness, dizziness, and disorientation) are similar to symptoms of having too much alcohol. If your health care provider has given you approval to drink alcohol, do so in moderation and use the following guidelines:  Women should not have more than one drink per day, and men should not have more than two drinks per day. One drink is equal to: ? 12 oz of beer. ? 5 oz of wine. ? 1 oz of hard liquor.  Do not drink on an empty stomach.  Keep yourself hydrated. Have water, diet soda, or unsweetened iced tea.  Regular soda, juice, and other mixers might contain a lot of carbohydrates and should be counted.  What foods are not recommended? As you make food choices, it is important to remember that all foods are not the same. Some foods have fewer nutrients per serving than other foods, even though they might have the same number of calories or carbohydrates. It is difficult to get your body what it needs when you eat foods with fewer nutrients. Examples of foods that you should avoid that are high in calories and carbohydrates but low in nutrients include:  Trans fats (most processed foods list trans fats on the Nutrition Facts label).  Regular soda.  Juice.  Candy.  Sweets, such as cake, pie, doughnuts, and cookies.  Fried foods.  What foods can I eat? Eat nutrient-rich foods, which will nourish your body and keep you healthy. The food you should eat also will depend on several factors, including:  The calories you need.  The medicines you take.  Your weight.  Your blood glucose level.  Your blood pressure  level.  Your cholesterol level.  You should eat a variety of foods, including:  Protein. ? Lean cuts of meat. ? Proteins low in saturated fats, such as fish, egg whites, and beans. Avoid processed meats.  Fruits and vegetables. ? Fruits and vegetables that may help control blood glucose levels, such as apples, mangoes, and yams.  Dairy products. ? Choose fat-free or low-fat dairy products, such as milk, yogurt, and cheese.  Grains, bread, pasta, and rice. ? Choose whole grain products, such as multigrain bread, whole oats, and brown rice. These foods may help control blood pressure.  Fats. ? Foods containing healthful fats, such as nuts, avocado, olive oil, canola oil, and fish.  Does everyone with diabetes mellitus have the same meal plan? Because every person with diabetes mellitus is different, there is not one meal plan that works for everyone. It is very important that you meet with a dietitian who will help you create a meal plan that is just right for you. This information is not intended to replace advice given to you by your health care provider. Make sure you discuss any questions you have with your health care provider. Document Released: 10/02/2004 Document Revised: 06/13/2015 Document Reviewed: 12/02/2012 Elsevier Interactive Patient Education  2017 [employer].

## 2016-09-16 ENCOUNTER — Telehealth: Payer: Self-pay

## 2016-09-16 NOTE — Telephone Encounter (Signed)
Approved.  

## 2016-09-16 NOTE — Telephone Encounter (Signed)
Animator with [facility] left v/m requesting verbal orders to do normal saline wet to dry dressing which was what surgeon previously ordered to amputation site. 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

## 2016-09-17 NOTE — Telephone Encounter (Signed)
Message left for Amber with [facility] to return my call.

## 2016-09-18 NOTE — Telephone Encounter (Signed)
[facility] to [facility] Kate's approval on the verbal order.

## 2016-09-23 ENCOUNTER — Ambulatory Visit (INDEPENDENT_AMBULATORY_CARE_PROVIDER_SITE_OTHER): Payer: Self-pay | Admitting: Primary Care

## 2016-09-23 ENCOUNTER — Encounter: Payer: Self-pay | Admitting: Primary Care

## 2016-09-23 DIAGNOSIS — Z794 Long term (current) use of insulin: Secondary | ICD-10-CM

## 2016-09-23 DIAGNOSIS — E118 Type 2 diabetes mellitus with unspecified complications: Secondary | ICD-10-CM

## 2016-09-23 DIAGNOSIS — E785 Hyperlipidemia, unspecified: Secondary | ICD-10-CM

## 2016-09-23 DIAGNOSIS — I10 Essential (primary) hypertension: Secondary | ICD-10-CM

## 2016-09-23 MED ORDER — INSULIN NPH ISOPHANE & REGULAR (70-30) 100 UNIT/ML ~~LOC~~ SUSP
45.0000 [IU] | Freq: Two times a day (BID) | SUBCUTANEOUS | 11 refills | Status: DC
Start: 2016-09-23 — End: 2018-01-31

## 2016-09-23 MED ORDER — METFORMIN HCL 500 MG PO TABS: 1000.0000 mg | ORAL_TABLET | Freq: Two times a day (BID) | ORAL | 3 refills | Status: DC

## 2016-09-23 NOTE — Assessment & Plan Note (Signed)
Recent A1C improved to 7.5, will repeat in 3 months. Discussed potential adverse effects of adjusting insulin without my consultation. Will have him closely monitor glucose levels and report readings below 100 on a consistent basis. Lipid panel pending.  Repeat A1C in 3 month, follow up in 6 months.

## 2016-09-23 NOTE — Progress Notes (Signed)
Subjective:    Patient ID: Kenneth Cohen, male    DOB: Aug 21, 1965, 51 y.o.   MRN: [id number]  HPI  Kenneth Cohen is a 51 year old male who presents today for follow up.  1) Essential Hypertension: Currently managed on lisinopril 40 mg, HCTZ 25 mg, and Amlodipine 10 mg. Amlodipine 10 mg was added last visit due to persistent elevated BP readings despite prior treatment.  His BP in the office today is 122/82. He's not checking his BP at home. He denies chest pain, dizziness.   2) Type 2 Diabetes: Currently managed on Novolin 70/30 35 units BID and Metformin 1000 mg BID. He recently established with the diabetes program through [facility] and will start Monday. Recent A1C of 7.5. He has slowly increased his Novolin to 45 units BID on his own due to "high sugars. He's unwilling to make improvements in diet.   His fasting morning sugars are running 180-190's. His sugars before lunch are 120-160's. His evening sugars are running 180-190's.     Review of Systems  Eyes: Negative for visual disturbance.  Respiratory: Negative for shortness of breath.   Cardiovascular: Negative for chest pain.  Neurological: Negative for dizziness and headaches.       Past Medical History:  Diagnosis Date  . Diabetic Charcot foot (HCC)   . Hypertension   . Obesity   . Testicular torsion    as a child     Social History   Social History  . Marital status: Married    Spouse name: N/A  . Number of children: N/A  . Years of education: N/A   Occupational History  . Not on file.   Social History Main Topics  . Smoking status: Former Smoker    Quit date: 01/20/2004  . Smokeless tobacco: Never Used  . Alcohol use No     Comment: rarely  . Drug use: No  . Sexual activity: Not on file   Other Topics Concern  . Not on file   Social History Narrative   Married.   One son, junior in [employer].   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   Enjoys playing on the computer.    No past  surgical history on file.  Family History  Problem Relation Age of Onset  . Hypertension Mother   . Diabetes Mother   . Diabetes Father     Allergies  Allergen Reactions  . Ceclor [Cefaclor] Itching, Swelling and Other (See Comments)    Received and tolerated cefazolin on adm 06/2016  Reaction:  All over body swelling   . Iodinated Diagnostic Agents Hives and Itching    Immediately after receiving the iv contrast, patient started itching and developed welts on his forehead, back of head, chest and back.  ER Doctor and RN were notified and patient was told he should always be premedicated prior to receiving IV contrast    Current Outpatient Prescriptions on File Prior to Visit  Medication Sig Dispense Refill  . amLODipine (NORVASC) 10 MG tablet Take 1 tablet (10 mg total) by mouth daily. 90 tablet 0  . hydrochlorothiazide (HYDRODIURIL) 25 MG tablet Take 1 tablet (25 mg total) by mouth daily. 90 tablet 3  . lisinopril (PRINIVIL,ZESTRIL) 40 MG tablet Take 1 tablet (40 mg total) by mouth daily. 90 tablet 3  . sertraline (ZOLOFT) 50 MG tablet Take 1 tablet (50 mg total) by mouth daily. 30 tablet 3  . sildenafil (REVATIO) 20 MG tablet Take 40 mg by  mouth as needed (30 minutes to 1 hour before intercourse).     Current Facility-Administered Medications on File Prior to Visit  Medication Dose Route Frequency Provider Last Rate Last Dose  . mupirocin cream (BACTROBAN) 2 %   Topical BID Hyatt, Max T, DPM        BP 122/82   Pulse 79   Temp 98.2 F (36.8 C) (Oral)   Wt (!) 303 lb 12.8 oz (137.8 kg)   SpO2 99%   BMI 39.01 kg/m    Objective:   Physical Exam  Constitutional: He appears well-nourished.  Neck: Neck supple.  Cardiovascular: Normal rate and regular rhythm.   Pulmonary/Chest: Effort normal and breath sounds normal.  Skin: Skin is warm and dry.  Psychiatric: He has a normal mood and affect.          Assessment & Plan:

## 2016-09-23 NOTE — Patient Instructions (Addendum)
Continue lisinopril 40 mg, HCTZ 25 mg, amlodipine 10 mg tablets for high blood pressure.  Continue Novolin 70/30, 45 units twice daily.  Follow up with [facility] for the diabetes program as scheduled.  Schedule a lab only appointment this week or next week to recheck your cholesterol. Make sure you've fasted 8 hours prior to this appointment.  Schedule a lab only appointment in 3 months for re-evaluation of diabetes.   Schedule a follow up visit in 6 months for re-evaluation.  It was a pleasure to see you today!

## 2016-09-23 NOTE — Assessment & Plan Note (Signed)
Stable on lisinopril, HCTZ, Amlodipine, continue current regimen.

## 2016-09-23 NOTE — Assessment & Plan Note (Signed)
Repeat lipid panel pending. 

## 2016-11-09 ENCOUNTER — Other Ambulatory Visit: Payer: Self-pay | Admitting: Primary Care

## 2016-11-09 DIAGNOSIS — E118 Type 2 diabetes mellitus with unspecified complications: Secondary | ICD-10-CM

## 2016-11-09 DIAGNOSIS — Z794 Long term (current) use of insulin: Principal | ICD-10-CM

## 2016-11-11 ENCOUNTER — Other Ambulatory Visit: Payer: Self-pay | Admitting: Primary Care

## 2016-11-11 DIAGNOSIS — I10 Essential (primary) hypertension: Secondary | ICD-10-CM

## 2016-11-11 MED ORDER — LISINOPRIL 40 MG PO TABS: 40.0000 mg | ORAL_TABLET | Freq: Every day | ORAL | 1 refills | Status: DC

## 2016-11-11 MED ORDER — HYDROCHLOROTHIAZIDE 25 MG PO TABS: 25.0000 mg | ORAL_TABLET | Freq: Every day | ORAL | 1 refills | Status: DC

## 2016-12-09 ENCOUNTER — Other Ambulatory Visit: Payer: Self-pay

## 2017-01-04 ENCOUNTER — Other Ambulatory Visit: Payer: Self-pay

## 2017-02-16 ENCOUNTER — Other Ambulatory Visit: Payer: Self-pay

## 2017-03-15 ENCOUNTER — Other Ambulatory Visit (INDEPENDENT_AMBULATORY_CARE_PROVIDER_SITE_OTHER): Payer: Self-pay

## 2017-03-15 DIAGNOSIS — E118 Type 2 diabetes mellitus with unspecified complications: Secondary | ICD-10-CM

## 2017-03-15 DIAGNOSIS — Z794 Long term (current) use of insulin: Secondary | ICD-10-CM

## 2017-03-15 LAB — LIPID PANEL
CHOL/HDL RATIO: 7
CHOLESTEROL: 199 mg/dL (ref 0–200)
HDL: 26.7 mg/dL — ABNORMAL LOW (ref >39.00)
NonHDL: 172.73
TRIGLYCERIDES: 333 mg/dL — AB (ref 0.0–149.0)
VLDL: 66.6 mg/dL — AB (ref 0.0–40.0)

## 2017-03-15 LAB — LDL CHOLESTEROL, DIRECT: Direct LDL: 130 mg/dL

## 2017-03-15 LAB — HEMOGLOBIN A1C: HEMOGLOBIN A1C: 10.1 % — AB (ref 4.6–6.5)

## 2017-03-19 ENCOUNTER — Ambulatory Visit: Payer: Self-pay | Admitting: Primary Care

## 2017-03-19 VITALS — BP 156/100 | HR 88 | Temp 98.1°F | Wt 332.5 lb

## 2017-03-19 DIAGNOSIS — I10 Essential (primary) hypertension: Secondary | ICD-10-CM

## 2017-03-19 DIAGNOSIS — E118 Type 2 diabetes mellitus with unspecified complications: Secondary | ICD-10-CM

## 2017-03-19 DIAGNOSIS — Z794 Long term (current) use of insulin: Secondary | ICD-10-CM

## 2017-03-19 DIAGNOSIS — E785 Hyperlipidemia, unspecified: Secondary | ICD-10-CM

## 2017-03-19 MED ORDER — HYDROCHLOROTHIAZIDE 25 MG PO TABS: 25.0000 mg | ORAL_TABLET | Freq: Every day | ORAL | 3 refills | Status: DC

## 2017-03-19 MED ORDER — AMLODIPINE BESYLATE 10 MG PO TABS: 10.0000 mg | ORAL_TABLET | Freq: Every day | ORAL | 3 refills | Status: DC

## 2017-03-19 MED ORDER — METFORMIN HCL 500 MG PO TABS: 1000.0000 mg | ORAL_TABLET | Freq: Two times a day (BID) | ORAL | 3 refills | Status: DC

## 2017-03-19 MED ORDER — INSULIN NPH (HUMAN) (ISOPHANE) 100 UNIT/ML ~~LOC~~ SUSP: SUBCUTANEOUS | 5 refills | Status: DC

## 2017-03-19 MED ORDER — INSULIN REGULAR HUMAN 100 UNIT/ML IJ SOLN: INTRAMUSCULAR | 5 refills | Status: DC

## 2017-03-19 MED ORDER — ATORVASTATIN CALCIUM 20 MG PO TABS: ORAL_TABLET | ORAL | 3 refills | Status: DC

## 2017-03-19 MED ORDER — LISINOPRIL 40 MG PO TABS
40.0000 mg | ORAL_TABLET | Freq: Every day | ORAL | 3 refills | Status: DC
Start: 2017-03-19 — End: 2018-01-31

## 2017-03-19 NOTE — Assessment & Plan Note (Signed)
Recent A1C of 10.1, increase from 7.5 in August 2018. Poor diet and is not exercising. Already injecting 50 units of Novolin 70/30 BID and still uncontrolled.  Stop Novolin Mix. Start Relion N at 25 units BID and Relion R at 10 units TID for sugars greater than 110. Continue metformin. He will call in two weeks if glucose runs above 200. Follow up in 6 weeks with glucose logs.  Managed on ACE. Reinitiated statin.

## 2017-03-19 NOTE — Assessment & Plan Note (Signed)
No recent lipid panel until just recently, LDL above goal. He stopped his atorvastatin 2 years ago for some reason. Restart atorvastatin 20 mg. Repeat labs and LFT's in 6 weeks.

## 2017-03-19 NOTE — Patient Instructions (Addendum)
Start Relion N insulin. Inject 25 units into the skin twice daily.  Start Relion R insulin. Inject 10 units into the skin three times daily before meals for blood sugars greater than 110.  Continue Metformin.  Resume all blood pressure medications including Lisinopril, Amlodipine, Hydrochlorothiazide.   Restart atorvastatin 20 mg tablets for high cholesterol.   Please monitor your blood sugars and call me if you see readings at or above 200 on a consistent basis.   Schedule an appointment in 6 weeks for re-evaluation.

## 2017-03-19 NOTE — Assessment & Plan Note (Signed)
Stopped taking lisinopril and HCTZ 2 weeks ago, amlodipine 3 months ago. Resume all BP meds, discussed this with patient and sent plenty of refills to his pharmacy. He'll report readings at or above 130/80.

## 2017-03-19 NOTE — Progress Notes (Signed)
Subjective:    Patient ID: Kenneth Cohen, male    DOB: Jun 10, 1965, 52 y.o.   MRN: [id number]  HPI  Kenneth Cohen is a 52 year old male who presents today for follow up.  1) Type 2 Diabetes:  Current medications include: Novolin 70/30 50 units twice daily, metformin 1000 mg BID.   He had a hard time with his diet over the holidays and is eating more quantity of food. He is not exercising.    He is checking his blood glucose irregularly, he didn't check his glucose in late January and early February. Over the past week he's checked 3 times daily:   AM Fasting: high 200's Before lunch: high 200's, sometimes low 300's.  Before Dinner: high 200's, low 300's  Last A1C: 10.1 in late February 2019, 7.5 in August 2018 Pneumonia Vaccination: Completed in 2017 ACE/ARB: Lisinopril  Statin: None  Diet currently consists of:  Breakfast: Cereal, avocado, nuts, pasta  Lunch: Sometimes skips, pasta Dinner: Pasta, pizza, hamburger, sometimes restaurants  Snacks: Nuts, cookies Desserts: 4-5 times weekly Beverages: Water, coffee, diet soda, un-sweet tea  Exercise: He is not exercising   2) Hyperlipidemia: Currently not managed on statin. Recent lipid panel with TC of 199, LDL of 130. He was once managed on atorvastatin 20 mg, he never returned for refills. This was not discontinued.   3) Essential Hypertension: Currently managed on lisinopril 40 mg, amlodipine 10 mg, HCTZ 25 mg. He's not had his lisinopril or HCTZ in 2 weeks as he's not picked up the prescription. He stopped taking Amlodipine in December 2018 as he ran out of refills and thought it was a temporary prescription.   BP Readings from Last 3 Encounters:  03/19/17 (!) 156/100  09/23/16 122/82  09/09/16 (!) 140/96     Review of Systems  Constitutional: Negative for fatigue.  Eyes: Negative for visual disturbance.  Respiratory: Negative for shortness of breath.   Cardiovascular: Negative for chest pain.  Neurological: Negative  for dizziness and headaches.       Past Medical History:  Diagnosis Date  . Diabetic Charcot foot (HCC)   . Hypertension   . Obesity   . Testicular torsion    as a child     Social History   Socioeconomic History  . Marital status: Married    Spouse name: Not on file  . Number of children: Not on file  . Years of education: Not on file  . Highest education level: Not on file  Social Needs  . Financial resource strain: Not on file  . Food insecurity - worry: Not on file  . Food insecurity - inability: Not on file  . Transportation needs - medical: Not on file  . Transportation needs - non-medical: Not on file  Occupational History  . Not on file  Tobacco Use  . Smoking status: Former Smoker    Last attempt to quit: 01/20/2004    Years since quitting: 13.1  . Smokeless tobacco: Never Used  Substance and Sexual Activity  . Alcohol use: No    Alcohol/week: 0.0 oz    Comment: rarely  . Drug use: No  . Sexual activity: Not on file  Other Topics Concern  . Not on file  Social History Narrative   Married.   One son, junior in [employer].   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   Enjoys playing on the computer.    No past surgical history on file.  Family History  Problem  Relation Age of Onset  . Hypertension Mother   . Diabetes Mother   . Diabetes Father     Allergies  Allergen Reactions  . Ceclor [Cefaclor] Itching, Swelling and Other (See Comments)    Received and tolerated cefazolin on adm 06/2016  Reaction:  All over body swelling   . Iodinated Diagnostic Agents Hives and Itching    Immediately after receiving the iv contrast, patient started itching and developed welts on his forehead, back of head, chest and back.  ER Doctor and RN were notified and patient was told he should always be premedicated prior to receiving IV contrast    Current Outpatient Medications on File Prior to Visit  Medication Sig Dispense Refill  . insulin NPH-regular Human  (NOVOLIN 70/30) (70-30) 100 UNIT/ML injection Inject 45 Units into the skin 2 (two) times daily with a meal. 30 mL 11  . sertraline (ZOLOFT) 50 MG tablet Take 1 tablet (50 mg total) by mouth daily. 30 tablet 3  . sildenafil (REVATIO) 20 MG tablet Take 40 mg by mouth as needed (30 minutes to 1 hour before intercourse).     Current Facility-Administered Medications on File Prior to Visit  Medication Dose Route Frequency Provider Last Rate Last Dose  . mupirocin cream (BACTROBAN) 2 %   Topical BID Hyatt, Max T, DPM        BP (!) 156/100   Pulse 88   Temp 98.1 F (36.7 C) (Oral)   Wt (!) 332 lb 8 oz (150.8 kg)   SpO2 95%   BMI 42.69 kg/m    Objective:   Physical Exam  Constitutional: He is oriented to person, place, and time. He appears well-nourished.  Neck: Neck supple.  Cardiovascular: Normal rate and regular rhythm.  Pulmonary/Chest: Effort normal and breath sounds normal. He has no wheezes. He has no rales.  Neurological: He is alert and oriented to person, place, and time.  Skin: Skin is warm and dry.          Assessment & Plan:

## 2017-06-21 ENCOUNTER — Encounter (INDEPENDENT_AMBULATORY_CARE_PROVIDER_SITE_OTHER): Payer: Self-pay | Admitting: Ophthalmology

## 2017-11-02 ENCOUNTER — Telehealth: Payer: Self-pay | Admitting: Primary Care

## 2017-11-02 NOTE — Telephone Encounter (Signed)
Copied from CRM [id number]. Topic: Quick Communication - Rx Refill/Question >> Nov 02, 2017  5:11 PM Raoul Pitch, Sade R wrote: Medication: hydrochlorothiazide (HYDRODIURIL) 25 MG tablet , metFORMIN (GLUCOPHAGE) 500 MG tablet   Has the patient contacted their pharmacy? Yes  Preferred Pharmacy (with phone number or street name): [facility] [address] [place] - [address], [address] [phone] (Phone) [phone] (Fax)   Agent: Please be advised that RX refills may take up to 3 business days. We ask that you follow-up with your pharmacy.

## 2017-11-03 NOTE — Telephone Encounter (Signed)
I spoke with Tresa Endo at [facility] and pt has available refills for HCTZ and metformin and she will get ready for pick up. Pt notified and voiced understanding.

## 2017-11-19 ENCOUNTER — Encounter: Payer: Self-pay | Admitting: *Deleted

## 2017-12-24 ENCOUNTER — Other Ambulatory Visit: Payer: Self-pay | Admitting: Primary Care

## 2017-12-24 DIAGNOSIS — E118 Type 2 diabetes mellitus with unspecified complications: Secondary | ICD-10-CM

## 2017-12-24 DIAGNOSIS — Z794 Long term (current) use of insulin: Principal | ICD-10-CM

## 2017-12-24 MED ORDER — METFORMIN HCL 500 MG PO TABS: 1000.0000 mg | ORAL_TABLET | Freq: Two times a day (BID) | ORAL | 0 refills | Status: DC

## 2018-01-31 ENCOUNTER — Encounter: Payer: Self-pay | Admitting: Primary Care

## 2018-01-31 ENCOUNTER — Ambulatory Visit: Payer: Self-pay | Admitting: Primary Care

## 2018-01-31 VITALS — BP 142/76 | HR 90 | Temp 98.4°F | Wt 336.8 lb

## 2018-01-31 DIAGNOSIS — E785 Hyperlipidemia, unspecified: Secondary | ICD-10-CM

## 2018-01-31 DIAGNOSIS — I10 Essential (primary) hypertension: Secondary | ICD-10-CM

## 2018-01-31 DIAGNOSIS — Z23 Encounter for immunization: Secondary | ICD-10-CM

## 2018-01-31 DIAGNOSIS — E118 Type 2 diabetes mellitus with unspecified complications: Secondary | ICD-10-CM

## 2018-01-31 DIAGNOSIS — F4322 Adjustment disorder with anxiety: Secondary | ICD-10-CM

## 2018-01-31 DIAGNOSIS — Z794 Long term (current) use of insulin: Secondary | ICD-10-CM

## 2018-01-31 LAB — COMPREHENSIVE METABOLIC PANEL
ALT: 48 U/L (ref 0–53)
AST: 24 U/L (ref 0–37)
Albumin: 3.9 g/dL (ref 3.5–5.2)
Alkaline Phosphatase: 54 U/L (ref 39–117)
BUN: 17 mg/dL (ref 6–23)
CO2: 27 mEq/L (ref 19–32)
Calcium: 9.4 mg/dL (ref 8.4–10.5)
Chloride: 102 mEq/L (ref 96–112)
Creatinine, Ser: 1.08 mg/dL (ref 0.40–1.50)
GFR: 76.34 mL/min (ref >60.00)
Glucose, Bld: 318 mg/dL — ABNORMAL HIGH (ref 70–99)
Potassium: 4.3 mEq/L (ref 3.5–5.1)
Sodium: 136 mEq/L (ref 135–145)
Total Bilirubin: 0.6 mg/dL (ref 0.2–1.2)
Total Protein: 7 g/dL (ref 6.0–8.3)

## 2018-01-31 LAB — LIPID PANEL
CHOLESTEROL: 179 mg/dL (ref 0–200)
HDL: 29.4 mg/dL — ABNORMAL LOW (ref >39.00)
LDL Cholesterol: 111 mg/dL — ABNORMAL HIGH (ref 0–99)
NonHDL: 149.17
Total CHOL/HDL Ratio: 6
Triglycerides: 193 mg/dL — ABNORMAL HIGH (ref 0.0–149.0)
VLDL: 38.6 mg/dL (ref 0.0–40.0)

## 2018-01-31 LAB — HEMOGLOBIN A1C: Hgb A1c MFr Bld: 8.6 % — ABNORMAL HIGH (ref 4.6–6.5)

## 2018-01-31 MED ORDER — METFORMIN HCL 500 MG PO TABS: 1000.0000 mg | ORAL_TABLET | Freq: Two times a day (BID) | ORAL | 3 refills | Status: DC

## 2018-01-31 MED ORDER — AMLODIPINE BESYLATE 10 MG PO TABS: 10.0000 mg | ORAL_TABLET | Freq: Every day | ORAL | 3 refills | Status: DC

## 2018-01-31 MED ORDER — HYDROCHLOROTHIAZIDE 25 MG PO TABS: 25.0000 mg | ORAL_TABLET | Freq: Every day | ORAL | 3 refills | Status: DC

## 2018-01-31 MED ORDER — LISINOPRIL 40 MG PO TABS: 40.0000 mg | ORAL_TABLET | Freq: Every day | ORAL | 3 refills | Status: DC

## 2018-01-31 MED ORDER — ATORVASTATIN CALCIUM 20 MG PO TABS: ORAL_TABLET | ORAL | 3 refills | Status: DC

## 2018-01-31 NOTE — Assessment & Plan Note (Signed)
Above goal, off of medications consistently for 1-2 weeks. Recommended to start monitoring BP at home and call if readings are at or above 135/90. Refills sent to pharmacy. BMP pending.

## 2018-01-31 NOTE — Addendum Note (Signed)
Addended by: Shon Millet on: 01/31/2018 12:18 PM   Modules accepted: Orders

## 2018-01-31 NOTE — Patient Instructions (Addendum)
Stop by the lab prior to leaving today. I will notify you of your results once received.   I sent refills of your medications.  Start monitoring your blood pressure 1-2 times weekly, around the same time of day.  Ensure that you have rested for 30 minutes prior to checking your blood pressure. Record your readings and notify me if you see readings at or above 135/90.  I will be in touch in regards to your insulin once I receive your A1C test result.   Your next follow up visit will be based off of your lab results.   It was a pleasure to see you today!   Diabetes Mellitus and Nutrition, Adult When you have diabetes (diabetes mellitus), it is very important to have healthy eating habits because your blood sugar (glucose) levels are greatly affected by what you eat and drink. Eating healthy foods in the appropriate amounts, at about the same times every day, can help you:  Control your blood glucose.  Lower your risk of heart disease.  Improve your blood pressure.  Reach or maintain a healthy weight. Every person with diabetes is different, and each person has different needs for a meal plan. Your health care provider may recommend that you work with a diet and nutrition specialist (dietitian) to make a meal plan that is best for you. Your meal plan may vary depending on factors such as:  The calories you need.  The medicines you take.  Your weight.  Your blood glucose, blood pressure, and cholesterol levels.  Your activity level.  Other health conditions you have, such as heart or kidney disease. How do carbohydrates affect me? Carbohydrates, also called carbs, affect your blood glucose level more than any other type of food. Eating carbs naturally raises the amount of glucose in your blood. Carb counting is a method for keeping track of how many carbs you eat. Counting carbs is important to keep your blood glucose at a healthy level, especially if you use insulin or take certain  oral diabetes medicines. It is important to know how many carbs you can safely have in each meal. This is different for every person. Your dietitian can help you calculate how many carbs you should have at each meal and for each snack. Foods that contain carbs include:  Bread, cereal, rice, pasta, and crackers.  Potatoes and corn.  Peas, beans, and lentils.  Milk and yogurt.  Fruit and juice.  Desserts, such as cakes, cookies, ice cream, and candy. How does alcohol affect me? Alcohol can cause a sudden decrease in blood glucose (hypoglycemia), especially if you use insulin or take certain oral diabetes medicines. Hypoglycemia can be a life-threatening condition. Symptoms of hypoglycemia (sleepiness, dizziness, and confusion) are similar to symptoms of having too much alcohol. If your health care provider says that alcohol is safe for you, follow these guidelines:  Limit alcohol intake to no more than 1 drink per day for nonpregnant women and 2 drinks per day for men. One drink equals 12 oz of beer, 5 oz of wine, or 1 oz of hard liquor.  Do not drink on an empty stomach.  Keep yourself hydrated with water, diet soda, or unsweetened iced tea.  Keep in mind that regular soda, juice, and other mixers may contain a lot of sugar and must be counted as carbs. What are tips for following this plan?  Reading food labels  Start by checking the serving size on the "Nutrition Facts" label of packaged  foods and drinks. The amount of calories, carbs, fats, and other nutrients listed on the label is based on one serving of the item. Many items contain more than one serving per package.  Check the total grams (g) of carbs in one serving. You can calculate the number of servings of carbs in one serving by dividing the total carbs by 15. For example, if a food has 30 g of total carbs, it would be equal to 2 servings of carbs.  Check the number of grams (g) of saturated and trans fats in one serving.  Choose foods that have low or no amount of these fats.  Check the number of milligrams (mg) of salt (sodium) in one serving. Most people should limit total sodium intake to less than 2,300 mg per day.  Always check the nutrition information of foods labeled as "low-fat" or "nonfat". These foods may be higher in added sugar or refined carbs and should be avoided.  Talk to your dietitian to identify your daily goals for nutrients listed on the label. Shopping  Avoid buying canned, premade, or processed foods. These foods tend to be high in fat, sodium, and added sugar.  Shop around the outside edge of the grocery store. This includes fresh fruits and vegetables, bulk grains, fresh meats, and fresh dairy. Cooking  Use low-heat cooking methods, such as baking, instead of high-heat cooking methods like deep frying.  Cook using healthy oils, such as olive, canola, or sunflower oil.  Avoid cooking with butter, cream, or high-fat meats. Meal planning  Eat meals and snacks regularly, preferably at the same times every day. Avoid going long periods of time without eating.  Eat foods high in fiber, such as fresh fruits, vegetables, beans, and whole grains. Talk to your dietitian about how many servings of carbs you can eat at each meal.  Eat 4-6 ounces (oz) of lean protein each day, such as lean meat, chicken, fish, eggs, or tofu. One oz of lean protein is equal to: ? 1 oz of meat, chicken, or fish. ? 1 egg. ?  cup of tofu.  Eat some foods each day that contain healthy fats, such as avocado, nuts, seeds, and fish. Lifestyle  Check your blood glucose regularly.  Exercise regularly as told by your health care provider. This may include: ? 150 minutes of moderate-intensity or vigorous-intensity exercise each week. This could be brisk walking, biking, or water aerobics. ? Stretching and doing strength exercises, such as yoga or weightlifting, at least 2 times a week.  Take medicines as told  by your health care provider.  Do not use any products that contain nicotine or tobacco, such as cigarettes and e-cigarettes. If you need help quitting, ask your health care provider.  Work with a Veterinary surgeoncounselor or diabetes educator to identify strategies to manage stress and any emotional and social challenges. Questions to ask a health care provider  Do I need to meet with a diabetes educator?  Do I need to meet with a dietitian?  What number can I call if I have questions?  When are the best times to check my blood glucose? Where to find more information:  American Diabetes Association: diabetes.org  Academy of Nutrition and Dietetics: www.eatright.[employer]org  National Institute of Diabetes and Digestive and Kidney Diseases (NIH): CarFlippers.tnwww.niddk.nih.gov Summary  A healthy meal plan will help you control your blood glucose and maintain a healthy lifestyle.  Working with a diet and nutrition specialist (dietitian) can help you make a meal plan  that is best for you.  Keep in mind that carbohydrates (carbs) and alcohol have immediate effects on your blood glucose levels. It is important to count carbs and to use alcohol carefully. This information is not intended to replace advice given to you by your health care provider. Make sure you discuss any questions you have with your health care provider. Document Released: 10/02/2004 Document Revised: 08/05/2016 Document Reviewed: 02/10/2016 Elsevier Interactive Patient Education  2019 [employer].

## 2018-01-31 NOTE — Assessment & Plan Note (Signed)
Uncontrolled and has not been in for follow up as recommended. Glucose above goal. A1C pending. Will likely adjust NPH and reduce Regular insulin.  Refills provided for Metformin.   Discussed importance of regular follow up as advised. This will be determined on his pending A1C. Mangaed on ACE and statin. Eye exam UTD. Pneumonia vaccination UTD.

## 2018-01-31 NOTE — Assessment & Plan Note (Signed)
No longer taking Zoloft, doing well off of medication. Continue to monitor.

## 2018-01-31 NOTE — Addendum Note (Signed)
Addended by: Wendi Maya on: 01/31/2018 11:52 AM   Modules accepted: Orders

## 2018-01-31 NOTE — Progress Notes (Signed)
Subjective:    Patient ID: Kenneth Cohen, male    DOB: 01-Nov-1965, 53 y.o.   MRN: [id number]  HPI  Kenneth Cohen is a 53 year old male who presents today for follow up.  1) Type 2 Diabetes:  Current medications include: Relion NPH 25 units BID, Relion R 10 units TID, Metformin 1000 mg BID.  He is injecting Relion N 30 units twice daily, increased this on his own. He is injecting Relion R 25 units 2-3 times daily, increased this on his own. He ran out of his Metformin once weekly.  He was using his father's Lantus, injecting 50 units, for about one month. He endorses readings of 100-130's.   He is checking his blood glucose 2-3 times daily and is getting readings of: AM fasting: 180-low 200's Before Lunch: low 200's Before Dinner: low 200's  Last A1C: 10.1 in February 2019, has not been seen since Last Eye Exam: Completed in 2019 Pneumonia Vaccination: Completed last in 2017 ACE/ARB: lisinopril  Statin: atorvastatin   Diet currently consists of:  Breakfast: Left overs, eggs and biscuit with sausage Lunch: Left overs Dinner: Pasta, hamburgers, steak, baked potatoes, some vegetables, some fried chicken and french fries Snacks: None Desserts: Daily Beverages: Diet soda, water, sugar free vitamin water, coffee, unsweet tea  Exercise: He is not exercising.   2) Hyperlipidemia: Currently managed on atorvastatin 20 mg. Last lipid panel in February 2019 with LDL of 130. He was asked in February 2019 to return for an office visit to go over labs, he never scheduled. He ran out of his atorvastatin months ago.   3) Essential Hypertension: Currently managed on lisinopril 40 mg, amlodipine 10 mg, HCTZ 25 mg. He ran out of his blood pressure medications for the last one week, has taken a few of his wife's pills.   He's not checking his BP at home.   BP Readings from Last 3 Encounters:  01/31/18 (!) 142/76  03/19/17 (!) 156/100  09/23/16 122/82     Review of Systems  Respiratory:  Negative for shortness of breath.   Cardiovascular: Negative for chest pain.  Neurological: Negative for dizziness and headaches.       Past Medical History:  Diagnosis Date  . Diabetic Charcot foot (HCC)   . Hypertension   . Obesity   . Testicular torsion    as a child     Social History   Socioeconomic History  . Marital status: Married    Spouse name: Not on file  . Number of children: Not on file  . Years of education: Not on file  . Highest education level: Not on file  Occupational History  . Not on file  Social Needs  . Financial resource strain: Not on file  . Food insecurity:    Worry: Not on file    Inability: Not on file  . Transportation needs:    Medical: Not on file    Non-medical: Not on file  Tobacco Use  . Smoking status: Former Smoker    Last attempt to quit: 01/20/2004    Years since quitting: 14.0  . Smokeless tobacco: Never Used  Substance and Sexual Activity  . Alcohol use: No    Alcohol/week: 0.0 standard drinks    Comment: rarely  . Drug use: No  . Sexual activity: Not on file  Lifestyle  . Physical activity:    Days per week: Not on file    Minutes per session: Not on file  . Stress:  Not on file  Relationships  . Social connections:    Talks on phone: Not on file    Gets together: Not on file    Attends religious service: Not on file    Active member of club or organization: Not on file    Attends meetings of clubs or organizations: Not on file    Relationship status: Not on file  . Intimate partner violence:    Fear of current or ex partner: Not on file    Emotionally abused: Not on file    Physically abused: Not on file    Forced sexual activity: Not on file  Other Topics Concern  . Not on file  Social History Narrative   Married.   One son, junior in [employer].   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   Enjoys playing on the computer.    No past surgical history on file.  Family History  Problem Relation Age of  Onset  . Hypertension Mother   . Diabetes Mother   . Diabetes Father     Allergies  Allergen Reactions  . Cefaclor Itching, Swelling and Other (See Comments)    Received and tolerated cefazolin on adm 06/2016  Reaction:  All over body swelling  Received and tolerated cefazolin on adm 06/2016   . Iodinated Diagnostic Agents Hives and Itching    Immediately after receiving the iv contrast, patient started itching and developed welts on his forehead, back of head, chest and back.  ER Doctor and RN were notified and patient was told he should always be premedicated prior to receiving IV contrast    Current Outpatient Medications on File Prior to Visit  Medication Sig Dispense Refill  . amLODipine (NORVASC) 10 MG tablet Take 1 tablet (10 mg total) by mouth daily. 90 tablet 3  . atorvastatin (LIPITOR) 20 MG tablet Take 1 tablet by mouth at bedtime for cholesterol. 90 tablet 3  . hydrochlorothiazide (HYDRODIURIL) 25 MG tablet Take 1 tablet (25 mg total) by mouth daily. 90 tablet 3  . insulin NPH Human (NOVOLIN N RELION) 100 UNIT/ML injection Inject 25 units into the skin twice daily. 10 mL 5  . insulin regular (NOVOLIN R RELION) 100 units/mL injection Inject 10 units into the skin three times daily before meals for blood sugars greater than 110. 10 mL 5  . lisinopril (PRINIVIL,ZESTRIL) 40 MG tablet Take 1 tablet (40 mg total) by mouth daily. 90 tablet 3  . metFORMIN (GLUCOPHAGE) 500 MG tablet Take 2 tablets (1,000 mg total) by mouth 2 (two) times daily with a meal. 120 tablet 0  . sildenafil (REVATIO) 20 MG tablet Take 40 mg by mouth as needed (30 minutes to 1 hour before intercourse).     Current Facility-Administered Medications on File Prior to Visit  Medication Dose Route Frequency Provider Last Rate Last Dose  . mupirocin cream (BACTROBAN) 2 %   Topical BID Hyatt, Max T, DPM        BP (!) 142/76 (BP Location: Right Arm, Patient Position: Sitting, Cuff Size: Large)   Pulse 90   Temp 98.4  F (36.9 C) (Oral)   Wt (!) 336 lb 12 oz (152.7 kg)   SpO2 96%   BMI 43.24 kg/m    Objective:   Physical Exam  Constitutional: He appears well-nourished.  Neck: Neck supple.  Cardiovascular: Normal rate and regular rhythm.  Respiratory: Effort normal and breath sounds normal.  Skin: Skin is warm and dry.  Psychiatric: He has a  normal mood and affect.           Assessment & Plan:

## 2018-01-31 NOTE — Assessment & Plan Note (Signed)
Out of medication for months. Refills sent to pharmacy. Stressed importance of compliance to medication and recommended follow up visits. Repeat lipids in 6 weeks. LDL goal of <100.

## 2018-03-08 ENCOUNTER — Other Ambulatory Visit: Payer: Self-pay | Admitting: Primary Care

## 2018-03-08 DIAGNOSIS — E785 Hyperlipidemia, unspecified: Secondary | ICD-10-CM

## 2018-03-14 ENCOUNTER — Other Ambulatory Visit (INDEPENDENT_AMBULATORY_CARE_PROVIDER_SITE_OTHER): Payer: Self-pay

## 2018-03-14 DIAGNOSIS — E785 Hyperlipidemia, unspecified: Secondary | ICD-10-CM

## 2018-03-14 LAB — LIPID PANEL
CHOLESTEROL: 124 mg/dL (ref 0–200)
HDL: 25.7 mg/dL — ABNORMAL LOW (ref >39.00)
NonHDL: 98.41
Total CHOL/HDL Ratio: 5
Triglycerides: 244 mg/dL — ABNORMAL HIGH (ref 0.0–149.0)
VLDL: 48.8 mg/dL — ABNORMAL HIGH (ref 0.0–40.0)

## 2018-03-14 LAB — LDL CHOLESTEROL, DIRECT: LDL DIRECT: 90 mg/dL

## 2018-04-21 ENCOUNTER — Telehealth: Payer: Self-pay | Admitting: Primary Care

## 2018-04-21 NOTE — Telephone Encounter (Signed)
Called to schedule a follow up with labs prior. Patient has WebEx capability. He will call back with a good day because he does not know when his wife will be off work.

## 2018-11-16 ENCOUNTER — Other Ambulatory Visit: Payer: Self-pay | Admitting: Primary Care

## 2018-11-16 DIAGNOSIS — I10 Essential (primary) hypertension: Secondary | ICD-10-CM

## 2018-11-16 DIAGNOSIS — E785 Hyperlipidemia, unspecified: Secondary | ICD-10-CM

## 2019-01-30 ENCOUNTER — Other Ambulatory Visit: Payer: Self-pay | Admitting: Primary Care

## 2019-01-30 DIAGNOSIS — Z794 Long term (current) use of insulin: Secondary | ICD-10-CM

## 2019-01-30 DIAGNOSIS — I10 Essential (primary) hypertension: Secondary | ICD-10-CM

## 2019-01-30 DIAGNOSIS — E118 Type 2 diabetes mellitus with unspecified complications: Secondary | ICD-10-CM

## 2019-01-30 MED ORDER — METFORMIN HCL 500 MG PO TABS: 1000.0000 mg | ORAL_TABLET | Freq: Two times a day (BID) | ORAL | 0 refills | Status: DC

## 2019-01-30 MED ORDER — HYDROCHLOROTHIAZIDE 25 MG PO TABS: 25.0000 mg | ORAL_TABLET | Freq: Every day | ORAL | 0 refills | Status: DC

## 2019-02-10 ENCOUNTER — Ambulatory Visit: Payer: Self-pay | Admitting: Primary Care

## 2019-03-29 ENCOUNTER — Other Ambulatory Visit: Payer: Self-pay

## 2019-03-29 ENCOUNTER — Ambulatory Visit (INDEPENDENT_AMBULATORY_CARE_PROVIDER_SITE_OTHER): Payer: Medicare Other | Admitting: Primary Care

## 2019-03-29 ENCOUNTER — Encounter: Payer: Self-pay | Admitting: Primary Care

## 2019-03-29 VITALS — BP 140/86 | HR 103 | Temp 97.0°F | Ht 74.0 in | Wt 317.5 lb

## 2019-03-29 DIAGNOSIS — Z794 Long term (current) use of insulin: Secondary | ICD-10-CM | POA: Diagnosis not present

## 2019-03-29 DIAGNOSIS — I10 Essential (primary) hypertension: Secondary | ICD-10-CM | POA: Diagnosis not present

## 2019-03-29 DIAGNOSIS — E785 Hyperlipidemia, unspecified: Secondary | ICD-10-CM

## 2019-03-29 DIAGNOSIS — E118 Type 2 diabetes mellitus with unspecified complications: Secondary | ICD-10-CM

## 2019-03-29 DIAGNOSIS — N529 Male erectile dysfunction, unspecified: Secondary | ICD-10-CM | POA: Diagnosis not present

## 2019-03-29 DIAGNOSIS — Z125 Encounter for screening for malignant neoplasm of prostate: Secondary | ICD-10-CM

## 2019-03-29 LAB — POCT GLYCOSYLATED HEMOGLOBIN (HGB A1C): Hemoglobin A1C: 8.4 % — AB (ref 4.0–5.6)

## 2019-03-29 LAB — COMPREHENSIVE METABOLIC PANEL
ALT: 50 U/L (ref 0–53)
AST: 24 U/L (ref 0–37)
Albumin: 4 g/dL (ref 3.5–5.2)
Alkaline Phosphatase: 57 U/L (ref 39–117)
BUN: 25 mg/dL — ABNORMAL HIGH (ref 6–23)
CO2: 29 mEq/L (ref 19–32)
Calcium: 9.5 mg/dL (ref 8.4–10.5)
Chloride: 99 mEq/L (ref 96–112)
Creatinine, Ser: 1.19 mg/dL (ref 0.40–1.50)
GFR: 63.93 mL/min (ref >60.00)
Glucose, Bld: 205 mg/dL — ABNORMAL HIGH (ref 70–99)
Potassium: 4.4 mEq/L (ref 3.5–5.1)
Sodium: 136 mEq/L (ref 135–145)
Total Bilirubin: 0.6 mg/dL (ref 0.2–1.2)
Total Protein: 7 g/dL (ref 6.0–8.3)

## 2019-03-29 LAB — CBC
HCT: 41.2 % (ref 39.0–52.0)
Hemoglobin: 13.7 g/dL (ref 13.0–17.0)
MCHC: 33.2 g/dL (ref 30.0–36.0)
MCV: 89.1 fl (ref 78.0–100.0)
Platelets: 248 10*3/uL (ref 150.0–400.0)
RBC: 4.62 Mil/uL (ref 4.22–5.81)
RDW: 15.2 % (ref 11.5–15.5)
WBC: 7.8 10*3/uL (ref 4.0–10.5)

## 2019-03-29 LAB — LIPID PANEL
Cholesterol: 134 mg/dL (ref 0–200)
HDL: 30.8 mg/dL — ABNORMAL LOW (ref >39.00)
LDL Cholesterol: 79 mg/dL (ref 0–99)
NonHDL: 102.79
Total CHOL/HDL Ratio: 4
Triglycerides: 121 mg/dL (ref 0.0–149.0)
VLDL: 24.2 mg/dL (ref 0.0–40.0)

## 2019-03-29 LAB — PSA, MEDICARE: PSA: 0.3 ng/ml (ref 0.10–4.00)

## 2019-03-29 MED ORDER — SILDENAFIL CITRATE 20 MG PO TABS: 40.0000 mg | ORAL_TABLET | ORAL | 0 refills | Status: DC | PRN

## 2019-03-29 MED ORDER — HYDROCHLOROTHIAZIDE 25 MG PO TABS: 25.0000 mg | ORAL_TABLET | Freq: Every day | ORAL | 3 refills | Status: DC

## 2019-03-29 MED ORDER — INSULIN REGULAR HUMAN 100 UNIT/ML IJ SOLN: INTRAMUSCULAR | 5 refills | Status: DC

## 2019-03-29 MED ORDER — ATORVASTATIN CALCIUM 20 MG PO TABS: ORAL_TABLET | ORAL | 3 refills | Status: DC

## 2019-03-29 MED ORDER — LOSARTAN POTASSIUM 100 MG PO TABS: 100.0000 mg | ORAL_TABLET | Freq: Every day | ORAL | 3 refills | Status: DC

## 2019-03-29 MED ORDER — AMLODIPINE BESYLATE 10 MG PO TABS: ORAL_TABLET | ORAL | 3 refills | Status: DC

## 2019-03-29 MED ORDER — INSULIN NPH (HUMAN) (ISOPHANE) 100 UNIT/ML ~~LOC~~ SUSP: SUBCUTANEOUS | 5 refills | Status: DC

## 2019-03-29 MED ORDER — METFORMIN HCL 500 MG PO TABS: 1000.0000 mg | ORAL_TABLET | Freq: Two times a day (BID) | ORAL | 3 refills | Status: DC

## 2019-03-29 NOTE — Progress Notes (Signed)
Subjective:    Patient ID: Kenneth Cohen, male    DOB: 03/30/1965, 54 y.o.   MRN: [id number]  HPI  This visit occurred during the SARS-CoV-2 public health emergency.  Safety protocols were in place, including screening questions prior to the visit, additional usage of staff PPE, and extensive cleaning of exam room while observing appropriate contact time as indicated for disinfecting solutions.   Kenneth Cohen is a 54 year old male who presents today for follow up of chronic conditions and a chief complaint of shoulder pain.  1) Hypertension: Currently managed on amlodipine 10 mg, HCTZ 25 mg, lisinopril 40 mg. He is checking BP at home which is running 140's/80's.   BP Readings from Last 3 Encounters:  03/29/19 140/86  01/31/18 (!) 142/76  03/19/17 (!) 156/100   2) Type 2 Diabetes:  Current medications include: Metformin 500 mg BID, NPH 25 units BID, Regular insulin 10 units TID with meals. He is injecting 45 units of NPH twice daily for "a while now". He is injecting regular insulin 35 units three times daily with meals.   He is checking his blood glucose 2-3 times daily and is getting readings of  AM fasting: low 200's, some low 100's when he didn't eat at night Before Lunch: mid 200's, some low 300's 1-2 hours after Dinner: low 200's, high 100's.   Last A1C: 8.6 in January 2020, 8.4 today Last Eye Exam: Completed in 2020 Last Foot Exam: Due  Pneumonia Vaccination: Completed in 2017 ACE/ARB: lisinopril  Statin: atorvastatin   Wt Readings from Last 3 Encounters:  03/29/19 (!) 317 lb 8 oz (144 kg)  01/31/18 (!) 336 lb 12 oz (152.7 kg)  03/19/17 (!) 332 lb 8 oz (150.8 kg)     Review of Systems  Eyes: Negative for visual disturbance.  Respiratory: Negative for shortness of breath.   Cardiovascular: Negative for chest pain.  Neurological: Negative for dizziness and headaches.       Past Medical History:  Diagnosis Date  . Diabetic Charcot foot ([place])   . Hypertension    . Obesity   . Testicular torsion    as a child     Social History   Socioeconomic History  . Marital status: Married    Spouse name: Not on file  . Number of children: Not on file  . Years of education: Not on file  . Highest education level: Not on file  Occupational History  . Not on file  Tobacco Use  . Smoking status: Former Smoker    Quit date: 01/20/2004    Years since quitting: 15.1  . Smokeless tobacco: Never Used  Substance and Sexual Activity  . Alcohol use: No    Alcohol/week: 0.0 standard drinks    Comment: rarely  . Drug use: No  . Sexual activity: Not on file  Other Topics Concern  . Not on file  Social History Narrative   Married.   One son, junior in [employer].   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   Enjoys playing on the computer.   Social Determinants of Health   Financial Resource Strain:   . Difficulty of Paying Living Expenses: Not on file  Food Insecurity:   . Worried About Charity fundraiser in the Last Year: Not on file  . Ran Out of Food in the Last Year: Not on file  Transportation Needs:   . Lack of Transportation (Medical): Not on file  . Lack of Transportation (Non-Medical): Not  on file  Physical Activity:   . Days of Exercise per Week: Not on file  . Minutes of Exercise per Session: Not on file  Stress:   . Feeling of Stress : Not on file  Social Connections:   . Frequency of Communication with Friends and Family: Not on file  . Frequency of Social Gatherings with Friends and Family: Not on file  . Attends Religious Services: Not on file  . Active Member of Clubs or Organizations: Not on file  . Attends Banker Meetings: Not on file  . Marital Status: Not on file  Intimate Partner Violence:   . Fear of Current or Ex-Partner: Not on file  . Emotionally Abused: Not on file  . Physically Abused: Not on file  . Sexually Abused: Not on file    No past surgical history on file.  Family History  Problem  Relation Age of Onset  . Hypertension Mother   . Diabetes Mother   . Diabetes Father     Allergies  Allergen Reactions  . Cefaclor Itching, Swelling and Other (See Comments)    Received and tolerated cefazolin on adm 06/2016  Reaction:  All over body swelling  Received and tolerated cefazolin on adm 06/2016   . Iodinated Diagnostic Agents Hives and Itching    Immediately after receiving the iv contrast, patient started itching and developed welts on his forehead, back of head, chest and back.  ER Doctor and RN were notified and patient was told he should always be premedicated prior to receiving IV contrast    No current outpatient medications on file prior to visit.   Current Facility-Administered Medications on File Prior to Visit  Medication Dose Route Frequency Provider Last Rate Last Admin  . mupirocin cream (BACTROBAN) 2 %   Topical BID Hyatt, Max T, DPM        BP 140/86   Pulse (!) 103   Temp (!) 97 F (36.1 C) (Temporal)   Ht 6\' 2"  (1.88 m)   Wt (!) 317 lb 8 oz (144 kg)   SpO2 98%   BMI 40.76 kg/m    Objective:   Physical Exam  Constitutional: He appears well-nourished.  Cardiovascular: Normal rate and regular rhythm.  Respiratory: Effort normal and breath sounds normal.  Musculoskeletal:     Cervical back: Neck supple.  Skin: Skin is warm and dry.  Psychiatric: He has a normal mood and affect.           Assessment & Plan:

## 2019-03-29 NOTE — Assessment & Plan Note (Signed)
Repeat lipids pending.  Compliant to atorvastatin. 

## 2019-03-29 NOTE — Assessment & Plan Note (Addendum)
A1C today of 8.4 which is above goal but slightly better than last year. We dicussed the importance of regular follow up at least every 6 months if controlled.  Increase NPH to 50 units BID. Continue regular at 35 units TID with meals. Continue Metformin 1000 mg BID.  He will schedule an eye exam. Pneumonia vaccination UTD. Foot exam next visit.  Handicap placard completed due to left BKA with prosthesis in place.  Follow up in 4 months.

## 2019-03-29 NOTE — Assessment & Plan Note (Signed)
Above goal in the office today, also with home readings. Stop lisinopril. Start losartan. Continue amlodipine and HCTZ. Consider hydralazine if needed.  He will monitor BP and notify if he sees readings at or above 135/90.  CMP pending.

## 2019-03-29 NOTE — Assessment & Plan Note (Signed)
Refill provided for PRN use of sildenafil.

## 2019-03-29 NOTE — Patient Instructions (Addendum)
Stop taking lisinopril 40 mg for blood pressure.  Start taking losartan 100 mg once daily for blood pressure. Continue taking hydrochlorothiazide and amlodipine for blood pressure.  Monitor your blood pressure and notify me if you see readings at or above 135/90 consistently after 2 weeks.  It is important that you improve your diet. Please limit carbohydrates in the form of white bread, rice, pasta, sweets, fast food, fried food, sugary drinks, etc. Increase your consumption of fresh fruits and vegetables, whole grains, lean protein.  Ensure you are consuming 64 ounces of water daily.  Start exercising. You should be getting 150 minutes of exercise weekly.  We've increased your NPH (cloudy) insulin to 50 units twice daily. Continue the regular (clear) insulin at 35 units three times daily with meals.  Continue checking your blood sugar levels.  Appropriate times to check your blood sugar levels are:  -Before any meal (breakfast, lunch, dinner) -Two hours after any meal (breakfast, lunch, dinner) -Bedtime  Record your readings and notify me if you continue to consistently run at or above 200.  Please schedule a follow up appointment in 4 months.  It was a pleasure to see you today!   Diabetes Mellitus and Nutrition, Adult When you have diabetes (diabetes mellitus), it is very important to have healthy eating habits because your blood sugar (glucose) levels are greatly affected by what you eat and drink. Eating healthy foods in the appropriate amounts, at about the same times every day, can help you:  Control your blood glucose.  Lower your risk of heart disease.  Improve your blood pressure.  Reach or maintain a healthy weight. Every person with diabetes is different, and each person has different needs for a meal plan. Your health care provider may recommend that you work with a diet and nutrition specialist (dietitian) to make a meal plan that is best for you. Your meal plan  may vary depending on factors such as:  The calories you need.  The medicines you take.  Your weight.  Your blood glucose, blood pressure, and cholesterol levels.  Your activity level.  Other health conditions you have, such as heart or kidney disease. How do carbohydrates affect me? Carbohydrates, also called carbs, affect your blood glucose level more than any other type of food. Eating carbs naturally raises the amount of glucose in your blood. Carb counting is a method for keeping track of how many carbs you eat. Counting carbs is important to keep your blood glucose at a healthy level, especially if you use insulin or take certain oral diabetes medicines. It is important to know how many carbs you can safely have in each meal. This is different for every person. Your dietitian can help you calculate how many carbs you should have at each meal and for each snack. Foods that contain carbs include:  Bread, cereal, rice, pasta, and crackers.  Potatoes and corn.  Peas, beans, and lentils.  Milk and yogurt.  Fruit and juice.  Desserts, such as cakes, cookies, ice cream, and candy. How does alcohol affect me? Alcohol can cause a sudden decrease in blood glucose (hypoglycemia), especially if you use insulin or take certain oral diabetes medicines. Hypoglycemia can be a life-threatening condition. Symptoms of hypoglycemia (sleepiness, dizziness, and confusion) are similar to symptoms of having too much alcohol. If your health care provider says that alcohol is safe for you, follow these guidelines:  Limit alcohol intake to no more than 1 drink per day for nonpregnant women and  2 drinks per day for men. One drink equals 12 oz of beer, 5 oz of wine, or 1 oz of hard liquor.  Do not drink on an empty stomach.  Keep yourself hydrated with water, diet soda, or unsweetened iced tea.  Keep in mind that regular soda, juice, and other mixers may contain a lot of sugar and must be counted as  carbs. What are tips for following this plan?  Reading food labels  Start by checking the serving size on the "Nutrition Facts" label of packaged foods and drinks. The amount of calories, carbs, fats, and other nutrients listed on the label is based on one serving of the item. Many items contain more than one serving per package.  Check the total grams (g) of carbs in one serving. You can calculate the number of servings of carbs in one serving by dividing the total carbs by 15. For example, if a food has 30 g of total carbs, it would be equal to 2 servings of carbs.  Check the number of grams (g) of saturated and trans fats in one serving. Choose foods that have low or no amount of these fats.  Check the number of milligrams (mg) of salt (sodium) in one serving. Most people should limit total sodium intake to less than 2,300 mg per day.  Always check the nutrition information of foods labeled as "low-fat" or "nonfat". These foods may be higher in added sugar or refined carbs and should be avoided.  Talk to your dietitian to identify your daily goals for nutrients listed on the label. Shopping  Avoid buying canned, premade, or processed foods. These foods tend to be high in fat, sodium, and added sugar.  Shop around the outside edge of the grocery store. This includes fresh fruits and vegetables, bulk grains, fresh meats, and fresh dairy. Cooking  Use low-heat cooking methods, such as baking, instead of high-heat cooking methods like deep frying.  Cook using healthy oils, such as olive, canola, or sunflower oil.  Avoid cooking with butter, cream, or high-fat meats. Meal planning  Eat meals and snacks regularly, preferably at the same times every day. Avoid going long periods of time without eating.  Eat foods high in fiber, such as fresh fruits, vegetables, beans, and whole grains. Talk to your dietitian about how many servings of carbs you can eat at each meal.  Eat 4-6 ounces (oz)  of lean protein each day, such as lean meat, chicken, fish, eggs, or tofu. One oz of lean protein is equal to: ? 1 oz of meat, chicken, or fish. ? 1 egg. ?  cup of tofu.  Eat some foods each day that contain healthy fats, such as avocado, nuts, seeds, and fish. Lifestyle  Check your blood glucose regularly.  Exercise regularly as told by your health care provider. This may include: ? 150 minutes of moderate-intensity or vigorous-intensity exercise each week. This could be brisk walking, biking, or water aerobics. ? Stretching and doing strength exercises, such as yoga or weightlifting, at least 2 times a week.  Take medicines as told by your health care provider.  Do not use any products that contain nicotine or tobacco, such as cigarettes and e-cigarettes. If you need help quitting, ask your health care provider.  Work with a Veterinary surgeon or diabetes educator to identify strategies to manage stress and any emotional and social challenges. Questions to ask a health care provider  Do I need to meet with a diabetes educator?  Do  I need to meet with a dietitian?  What number can I call if I have questions?  When are the best times to check my blood glucose? Where to find more information:  American Diabetes Association: diabetes.org  Academy of Nutrition and Dietetics: www.eatright.[employer] of Diabetes and Digestive and Kidney Diseases (NIH): CarFlippers.tn Summary  A healthy meal plan will help you control your blood glucose and maintain a healthy lifestyle.  Working with a diet and nutrition specialist (dietitian) can help you make a meal plan that is best for you.  Keep in mind that carbohydrates (carbs) and alcohol have immediate effects on your blood glucose levels. It is important to count carbs and to use alcohol carefully. This information is not intended to replace advice given to you by your health care provider. Make sure you discuss any questions you  have with your health care provider. Document Revised: 12/18/2016 Document Reviewed: 02/10/2016 Elsevier Patient Education  2020 [employer].

## 2019-12-29 ENCOUNTER — Other Ambulatory Visit: Payer: Self-pay | Admitting: Primary Care

## 2019-12-29 DIAGNOSIS — I10 Essential (primary) hypertension: Secondary | ICD-10-CM

## 2019-12-29 DIAGNOSIS — E785 Hyperlipidemia, unspecified: Secondary | ICD-10-CM

## 2020-03-29 ENCOUNTER — Other Ambulatory Visit: Payer: Self-pay | Admitting: Primary Care

## 2020-03-29 DIAGNOSIS — Z794 Long term (current) use of insulin: Secondary | ICD-10-CM

## 2020-03-29 DIAGNOSIS — I10 Essential (primary) hypertension: Secondary | ICD-10-CM

## 2020-03-29 MED ORDER — LOSARTAN POTASSIUM 100 MG PO TABS: ORAL_TABLET | ORAL | 0 refills | Status: DC

## 2020-03-29 MED ORDER — METFORMIN HCL 500 MG PO TABS: 1000.0000 mg | ORAL_TABLET | Freq: Two times a day (BID) | ORAL | 0 refills | Status: DC

## 2020-04-30 ENCOUNTER — Ambulatory Visit (INDEPENDENT_AMBULATORY_CARE_PROVIDER_SITE_OTHER): Payer: Medicare Other | Admitting: Primary Care

## 2020-04-30 ENCOUNTER — Other Ambulatory Visit: Payer: Self-pay

## 2020-04-30 ENCOUNTER — Encounter: Payer: Self-pay | Admitting: Primary Care

## 2020-04-30 VITALS — BP 140/88 | HR 78 | Temp 97.6°F | Ht 78.0 in | Wt 379.0 lb

## 2020-04-30 DIAGNOSIS — Z125 Encounter for screening for malignant neoplasm of prostate: Secondary | ICD-10-CM

## 2020-04-30 DIAGNOSIS — Z794 Long term (current) use of insulin: Secondary | ICD-10-CM | POA: Diagnosis not present

## 2020-04-30 DIAGNOSIS — E118 Type 2 diabetes mellitus with unspecified complications: Secondary | ICD-10-CM | POA: Diagnosis not present

## 2020-04-30 DIAGNOSIS — E349 Endocrine disorder, unspecified: Secondary | ICD-10-CM

## 2020-04-30 DIAGNOSIS — G63 Polyneuropathy in diseases classified elsewhere: Secondary | ICD-10-CM | POA: Diagnosis not present

## 2020-04-30 DIAGNOSIS — I10 Essential (primary) hypertension: Secondary | ICD-10-CM

## 2020-04-30 DIAGNOSIS — N529 Male erectile dysfunction, unspecified: Secondary | ICD-10-CM

## 2020-04-30 DIAGNOSIS — E785 Hyperlipidemia, unspecified: Secondary | ICD-10-CM | POA: Diagnosis not present

## 2020-04-30 DIAGNOSIS — Z1159 Encounter for screening for other viral diseases: Secondary | ICD-10-CM | POA: Diagnosis not present

## 2020-04-30 DIAGNOSIS — Z23 Encounter for immunization: Secondary | ICD-10-CM

## 2020-04-30 DIAGNOSIS — Z114 Encounter for screening for human immunodeficiency virus [HIV]: Secondary | ICD-10-CM

## 2020-04-30 DIAGNOSIS — F4322 Adjustment disorder with anxiety: Secondary | ICD-10-CM

## 2020-04-30 LAB — COMPREHENSIVE METABOLIC PANEL
ALT: 51 U/L (ref 0–53)
AST: 35 U/L (ref 0–37)
Albumin: 3.8 g/dL (ref 3.5–5.2)
Alkaline Phosphatase: 61 U/L (ref 39–117)
BUN: 27 mg/dL — ABNORMAL HIGH (ref 6–23)
CO2: 29 mEq/L (ref 19–32)
Calcium: 9.5 mg/dL (ref 8.4–10.5)
Chloride: 99 mEq/L (ref 96–112)
Creatinine, Ser: 1.34 mg/dL (ref 0.40–1.50)
GFR: 60.21 mL/min (ref >60.00)
Glucose, Bld: 177 mg/dL — ABNORMAL HIGH (ref 70–99)
Potassium: 4.7 mEq/L (ref 3.5–5.1)
Sodium: 138 mEq/L (ref 135–145)
Total Bilirubin: 0.6 mg/dL (ref 0.2–1.2)
Total Protein: 7.1 g/dL (ref 6.0–8.3)

## 2020-04-30 LAB — PSA, MEDICARE: PSA: 0.28 ng/ml (ref 0.10–4.00)

## 2020-04-30 LAB — CBC
HCT: 40.4 % (ref 39.0–52.0)
Hemoglobin: 13.7 g/dL (ref 13.0–17.0)
MCHC: 33.9 g/dL (ref 30.0–36.0)
MCV: 88.3 fl (ref 78.0–100.0)
Platelets: 222 10*3/uL (ref 150.0–400.0)
RBC: 4.57 Mil/uL (ref 4.22–5.81)
RDW: 15.3 % (ref 11.5–15.5)
WBC: 7.9 10*3/uL (ref 4.0–10.5)

## 2020-04-30 LAB — LIPID PANEL
Cholesterol: 110 mg/dL (ref 0–200)
HDL: 27.6 mg/dL — ABNORMAL LOW (ref >39.00)
LDL Cholesterol: 59 mg/dL (ref 0–99)
NonHDL: 82.17
Total CHOL/HDL Ratio: 4
Triglycerides: 114 mg/dL (ref 0.0–149.0)
VLDL: 22.8 mg/dL (ref 0.0–40.0)

## 2020-04-30 LAB — HEMOGLOBIN A1C: Hgb A1c MFr Bld: 9.2 % — ABNORMAL HIGH (ref 4.6–6.5)

## 2020-04-30 MED ORDER — TRULICITY 0.75 MG/0.5ML ~~LOC~~ SOAJ: 0.7500 mg | SUBCUTANEOUS | 0 refills | Status: DC

## 2020-04-30 MED ORDER — HYDROCHLOROTHIAZIDE 25 MG PO TABS: 25.0000 mg | ORAL_TABLET | Freq: Every day | ORAL | 3 refills | Status: DC

## 2020-04-30 MED ORDER — AMLODIPINE BESYLATE 10 MG PO TABS: 10.0000 mg | ORAL_TABLET | Freq: Every day | ORAL | 3 refills | Status: DC

## 2020-04-30 MED ORDER — METFORMIN HCL 500 MG PO TABS: 1000.0000 mg | ORAL_TABLET | Freq: Two times a day (BID) | ORAL | 3 refills | Status: DC

## 2020-04-30 MED ORDER — DEXCOM G6 TRANSMITTER MISC: 1.0000 | 3 refills | Status: DC

## 2020-04-30 MED ORDER — FREESTYLE LIBRE 14 DAY READER DEVI: 0 refills | Status: DC

## 2020-04-30 MED ORDER — DEXCOM G6 SENSOR MISC: 1.0000 | 3 refills | Status: DC

## 2020-04-30 MED ORDER — ZOSTER VAC RECOMB ADJUVANTED 50 MCG/0.5ML IM SUSR: 0.5000 mL | Freq: Once | INTRAMUSCULAR | 1 refills | Status: AC

## 2020-04-30 MED ORDER — ATORVASTATIN CALCIUM 20 MG PO TABS: 20.0000 mg | ORAL_TABLET | Freq: Every day | ORAL | 3 refills | Status: DC

## 2020-04-30 MED ORDER — FREESTYLE LIBRE 14 DAY SENSOR MISC: 1 refills | Status: DC

## 2020-04-30 MED ORDER — DEXCOM G6 RECEIVER DEVI: 1.0000 | Freq: Once | 0 refills | Status: AC

## 2020-04-30 MED ORDER — OLMESARTAN MEDOXOMIL 40 MG PO TABS: 40.0000 mg | ORAL_TABLET | Freq: Every day | ORAL | 3 refills | Status: DC

## 2020-04-30 NOTE — Patient Instructions (Signed)
Stop by the lab prior to leaving today. I will notify you of your results once received.   Start exercising. You should be getting 150 minutes of exercise weekly.  It is important that you improve your diet. Please limit carbohydrates in the form of white bread, rice, pasta, sweets, fast food, fried food, sugary drinks, etc. Increase your consumption of fresh fruits and vegetables, whole grains, lean protein.  Ensure you are consuming 64 ounces of water daily.  Start Trulicity once weekly for diabetes.  Continue the Novolin N at 60 units twice daily. Continue the Novolin R at 50 units three times daily with meals. Continue Metformin  Appropriate times to check your blood sugar levels are:  -Before any meal (breakfast, lunch, dinner) -Two hours after any meal (breakfast, lunch, dinner) -Bedtime  Record your readings and notify me if you continue to consistently run at or above 200 after 2 weeks.   Stop taking losartan 100 mg for blood pressure. Start taking olmesartan 40 mg for blood pressure.  Please schedule a follow up appointment in 3 months for diabetes follow up.  It was a pleasure to see you today!   Diabetes Mellitus and Nutrition, Adult When you have diabetes, or diabetes mellitus, it is very important to have healthy eating habits because your blood sugar (glucose) levels are greatly affected by what you eat and drink. Eating healthy foods in the right amounts, at about the same times every day, can help you:  Control your blood glucose.  Lower your risk of heart disease.  Improve your blood pressure.  Reach or maintain a healthy weight. What can affect my meal plan? Every person with diabetes is different, and each person has different needs for a meal plan. Your health care provider may recommend that you work with a dietitian to make a meal plan that is best for you. Your meal plan may vary depending on factors such as:  The calories you need.  The medicines  you take.  Your weight.  Your blood glucose, blood pressure, and cholesterol levels.  Your activity level.  Other health conditions you have, such as heart or kidney disease. How do carbohydrates affect me? Carbohydrates, also called carbs, affect your blood glucose level more than any other type of food. Eating carbs naturally raises the amount of glucose in your blood. Carb counting is a method for keeping track of how many carbs you eat. Counting carbs is important to keep your blood glucose at a healthy level, especially if you use insulin or take certain oral diabetes medicines. It is important to know how many carbs you can safely have in each meal. This is different for every person. Your dietitian can help you calculate how many carbs you should have at each meal and for each snack. How does alcohol affect me? Alcohol can cause a sudden decrease in blood glucose (hypoglycemia), especially if you use insulin or take certain oral diabetes medicines. Hypoglycemia can be a life-threatening condition. Symptoms of hypoglycemia, such as sleepiness, dizziness, and confusion, are similar to symptoms of having too much alcohol.  Do not drink alcohol if: ? Your health care provider tells you not to drink. ? You are pregnant, may be pregnant, or are planning to become pregnant.  If you drink alcohol: ? Do not drink on an empty stomach. ? Limit how much you use to:  0-1 drink a day for women.  0-2 drinks a day for men. ? Be aware of how much alcohol  is in your drink. In the [place] one drink equals one 12 oz bottle of beer (355 mL), one 5 oz glass of wine (148 mL), or one 1 oz glass of hard liquor (44 mL). ? Keep yourself hydrated with water, diet soda, or unsweetened iced tea.  Keep in mind that regular soda, juice, and other mixers may contain a lot of sugar and must be counted as carbs. What are tips for following this plan? Reading food labels  Start by checking the serving size on the  "Nutrition Facts" label of packaged foods and drinks. The amount of calories, carbs, fats, and other nutrients listed on the label is based on one serving of the item. Many items contain more than one serving per package.  Check the total grams (g) of carbs in one serving. You can calculate the number of servings of carbs in one serving by dividing the total carbs by 15. For example, if a food has 30 g of total carbs per serving, it would be equal to 2 servings of carbs.  Check the number of grams (g) of saturated fats and trans fats in one serving. Choose foods that have a low amount or none of these fats.  Check the number of milligrams (mg) of salt (sodium) in one serving. Most people should limit total sodium intake to less than 2,300 mg per day.  Always check the nutrition information of foods labeled as "low-fat" or "nonfat." These foods may be higher in added sugar or refined carbs and should be avoided.  Talk to your dietitian to identify your daily goals for nutrients listed on the label. Shopping  Avoid buying canned, pre-made, or processed foods. These foods tend to be high in fat, sodium, and added sugar.  Shop around the outside edge of the grocery store. This is where you will most often find fresh fruits and vegetables, bulk grains, fresh meats, and fresh dairy. Cooking  Use low-heat cooking methods, such as baking, instead of high-heat cooking methods like deep frying.  Cook using healthy oils, such as olive, canola, or sunflower oil.  Avoid cooking with butter, cream, or high-fat meats. Meal planning  Eat meals and snacks regularly, preferably at the same times every day. Avoid going long periods of time without eating.  Eat foods that are high in fiber, such as fresh fruits, vegetables, beans, and whole grains. Talk with your dietitian about how many servings of carbs you can eat at each meal.  Eat 4-6 oz (112-168 g) of lean protein each day, such as lean meat, chicken,  fish, eggs, or tofu. One ounce (oz) of lean protein is equal to: ? 1 oz (28 g) of meat, chicken, or fish. ? 1 egg. ?  cup (62 g) of tofu.  Eat some foods each day that contain healthy fats, such as avocado, nuts, seeds, and fish.   What foods should I eat? Fruits Berries. Apples. Oranges. Peaches. Apricots. Plums. Grapes. Mango. Papaya. Pomegranate. Kiwi. Cherries. Vegetables Lettuce. Spinach. Leafy greens, including kale, chard, collard greens, and mustard greens. Beets. Cauliflower. Cabbage. Broccoli. Carrots. Green beans. Tomatoes. Peppers. Onions. Cucumbers. Brussels sprouts. Grains Whole grains, such as whole-wheat or whole-grain bread, crackers, tortillas, cereal, and pasta. Unsweetened oatmeal. Quinoa. Brown or wild rice. Meats and other proteins Seafood. Poultry without skin. Lean cuts of poultry and beef. Tofu. Nuts. Seeds. Dairy Low-fat or fat-free dairy products such as milk, yogurt, and cheese. The items listed above may not be a complete list of foods and  beverages you can eat. Contact a dietitian for more information. What foods should I avoid? Fruits Fruits canned with syrup. Vegetables Canned vegetables. Frozen vegetables with butter or cream sauce. Grains Refined white flour and flour products such as bread, pasta, snack foods, and cereals. Avoid all processed foods. Meats and other proteins Fatty cuts of meat. Poultry with skin. Breaded or fried meats. Processed meat. Avoid saturated fats. Dairy Full-fat yogurt, cheese, or milk. Beverages Sweetened drinks, such as soda or iced tea. The items listed above may not be a complete list of foods and beverages you should avoid. Contact a dietitian for more information. Questions to ask a health care provider  Do I need to meet with a diabetes educator?  Do I need to meet with a dietitian?  What number can I call if I have questions?  When are the best times to check my blood glucose? Where to find more  information:  American Diabetes Association: diabetes.org  Academy of Nutrition and Dietetics: www.eatright.[employer] of Diabetes and Digestive and Kidney Diseases: CarFlippers.tn  Association of Diabetes Care and Education Specialists: www.diabeteseducator.org Summary  It is important to have healthy eating habits because your blood sugar (glucose) levels are greatly affected by what you eat and drink.  A healthy meal plan will help you control your blood glucose and maintain a healthy lifestyle.  Your health care provider may recommend that you work with a dietitian to make a meal plan that is best for you.  Keep in mind that carbohydrates (carbs) and alcohol have immediate effects on your blood glucose levels. It is important to count carbs and to use alcohol carefully. This information is not intended to replace advice given to you by your health care provider. Make sure you discuss any questions you have with your health care provider. Document Revised: 12/13/2018 Document Reviewed: 12/13/2018 Elsevier Patient Education  2021 [employer].

## 2020-04-30 NOTE — Assessment & Plan Note (Signed)
Using sildenafil PRN, no refills requested today. Continue sildenafil 20 mg PRN.

## 2020-04-30 NOTE — Assessment & Plan Note (Signed)
Likely uncontrolled provided reported readings. Poor diet, is mostly sedentary throughout his day.   Long discussion regarding the need to improve lifestyle. Rx for Kenneth Cohen sent to pharmacy.  Continue Novolin N 60 units BID and Novolin R 50 units TID with meals for now.   Add Trulicity 0.75 mg weekly.  Also discussed the absolute need for close diabetes follow up, his last visit with [place] for diabetes follow up was one year ago. He needs to be closely monitored for at least every 3 months.   Repeat A1C pending. Follow up in 3 months.

## 2020-04-30 NOTE — Assessment & Plan Note (Signed)
Above goal in the office today and during other visits despite max dose of Losartan 100 mg, HCTZ 25, and amlodipine 10 mg.  Will stop losartan 100 mg, try olmesartan 40 mg. Continue amlodipine 10 mg and HCTZ 25 mg. CMP pending. Refills provided.  He will notify if BP remains at or above 130/90.

## 2020-04-30 NOTE — Progress Notes (Addendum)
Subjective:    Patient ID: Kenneth Cohen, male    DOB: 1965-12-22, 55 y.o.   MRN: [id number]  HPI  Hasaan Radde is a very pleasant 55 y.o. male with a history of hypertension, type 2 diabetes, neuropathy, hyperlipidemia who presents today for follow up of chronic conditions and form completion.  He is also needing a continuous (Dexcom) glucose monitor due to frequent adjustment of insulin.   Immunizations: -Tetanus: 2016 -Influenza: Did not complete last season -Covid-19: Has not completed  -Shingles: Has not completed -Pneumonia: Prevnar in 2016, Pneumovax in 2017  Diet: He endorses a fair diet.  Exercise: He is not exercising.   Eye exam: Due, he will schedule  Dental exam: No recent exam  Colonoscopy: Declines PSA: 0.30 in 2021  BP Readings from Last 3 Encounters:  04/30/20 140/88  03/29/19 140/86  01/31/18 (!) 142/76   He has adjusted his Novolin N to 60 units BID and Novolin R to 50 units TID with meals on his own. He is compliant to his Metformin 1000 mg BID. He is interested in [employer] as he is checking his glucose 6 times daily. He endorses a fair diet. He is getting readings of 200-300.  He is not checking his BP at home. He is compliant to his amlodipine 10 mg, HCTZ 25 mg, and Losartan 100 mg daily. He denies chest pain, shortness of breath, headaches, dizziness.    Review of Systems  Eyes: Negative for visual disturbance.  Respiratory: Negative for shortness of breath.   Cardiovascular: Negative for chest pain.  Neurological: Positive for numbness. Negative for dizziness and headaches.         Past Medical History:  Diagnosis Date  . Diabetic Charcot foot (HCC)   . Hypertension   . Obesity   . Testicular torsion    as a child    Social History   Socioeconomic History  . Marital status: Married    Spouse name: Not on file  . Number of children: Not on file  . Years of education: Not on file  . Highest education level: Not on file   Occupational History  . Not on file  Tobacco Use  . Smoking status: Former Smoker    Quit date: 01/20/2004    Years since quitting: 16.2  . Smokeless tobacco: Never Used  Substance and Sexual Activity  . Alcohol use: No    Alcohol/week: 0.0 standard drinks    Comment: rarely  . Drug use: No  . Sexual activity: Not on file  Other Topics Concern  . Not on file  Social History Narrative   Married.   One son, junior in [employer].   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   Enjoys playing on the computer.   Social Determinants of Health   Financial Resource Strain: Not on file  Food Insecurity: Not on file  Transportation Needs: Not on file  Physical Activity: Not on file  Stress: Not on file  Social Connections: Not on file  Intimate Partner Violence: Not on file    History reviewed. No pertinent surgical history.  Family History  Problem Relation Age of Onset  . Hypertension Mother   . Diabetes Mother   . Diabetes Father     Allergies  Allergen Reactions  . Cefaclor Itching, Swelling and Other (See Comments)    Received and tolerated cefazolin on adm 06/2016  Reaction:  All over body swelling  Received and tolerated cefazolin on adm 06/2016   .  Iodinated Diagnostic Agents Hives and Itching    Immediately after receiving the iv contrast, patient started itching and developed welts on his forehead, back of head, chest and back.  ER Doctor and RN were notified and patient was told he should always be premedicated prior to receiving IV contrast    Current Outpatient Medications on File Prior to Visit  Medication Sig Dispense Refill  . amLODipine (NORVASC) 10 MG tablet TAKE 1 TABLET BY MOUTH EVERY DAY FOR BLOOD PRESSURE 90 tablet 0  . atorvastatin (LIPITOR) 20 MG tablet TAKE 1 TABLET BY MOUTH EVERY NIGHT AT BEDTIME FOR CHOLESTEROL 90 tablet 0  . hydrochlorothiazide (HYDRODIURIL) 25 MG tablet Take 1 tablet (25 mg total) by mouth daily. For blood pressure. 90 tablet 3  .  insulin NPH Human (NOVOLIN N RELION) 100 UNIT/ML injection Inject 50 units into the skin twice daily. (Patient taking differently: Inject 60 units into the skin twice daily.) 10 mL 5  . insulin regular (NOVOLIN R RELION) 100 units/mL injection Inject 35 units into the skin three times daily before meals for blood sugars greater than 100. (Patient taking differently: Inject 50 units into the skin three times daily before meals for blood sugars greater than 100.) 10 mL 5  . losartan (COZAAR) 100 MG tablet TAKE 1 TABLET BY MOUTH EVERY DAY FOR BLOOD PRESSURE 30 tablet 0  . metFORMIN (GLUCOPHAGE) 500 MG tablet Take 2 tablets (1,000 mg total) by mouth 2 (two) times daily with a meal. For diabetes. 120 tablet 0  . sildenafil (REVATIO) 20 MG tablet Take 2 tablets (40 mg total) by mouth as needed (30 minutes to 1 hour before intercourse). 10 tablet 0   Current Facility-Administered Medications on File Prior to Visit  Medication Dose Route Frequency Provider Last Rate Last Admin  . mupirocin cream (BACTROBAN) 2 %   Topical BID Hyatt, Max T, DPM        BP 140/88   Pulse 78   Temp 97.6 F (36.4 C) (Temporal)   Ht 6\' 6"  (1.981 m)   Wt (!) 379 lb (171.9 kg)   SpO2 96%   BMI 43.80 kg/m  Objective:   Physical Exam Cardiovascular:     Rate and Rhythm: Normal rate and regular rhythm.  Pulmonary:     Effort: Pulmonary effort is normal.     Breath sounds: Normal breath sounds. No wheezing or rales.  Abdominal:     Palpations: Abdomen is soft.     Tenderness: There is no abdominal tenderness.  Musculoskeletal:     Cervical back: Neck supple.  Skin:    General: Skin is warm and dry.  Neurological:     Mental Status: He is alert and oriented to person, place, and time.           Assessment & Plan:      This visit occurred during the SARS-CoV-2 public health emergency.  Safety protocols were in place, including screening questions prior to the visit, additional usage of staff PPE, and  extensive cleaning of exam room while observing appropriate contact time as indicated for disinfecting solutions.

## 2020-04-30 NOTE — Assessment & Plan Note (Signed)
Chronic, no concerns today. Discussed foot care.

## 2020-04-30 NOTE — Assessment & Plan Note (Signed)
Denies concerns today. °Continue to monitor. °

## 2020-04-30 NOTE — Assessment & Plan Note (Signed)
Compliant to atorvastatin 20 mg daily, repeat lipid panel pending. Refills sent to pharmacy.

## 2020-04-30 NOTE — Addendum Note (Signed)
Addended by: Donnamarie Poag on: 04/30/2020 10:29 AM   Modules accepted: Orders

## 2020-05-01 LAB — HEPATITIS C ANTIBODY
Hepatitis C Ab: NONREACTIVE
SIGNAL TO CUT-OFF: 0.03 (ref <1.00)

## 2020-05-01 LAB — HIV ANTIBODY (ROUTINE TESTING W REFLEX): HIV 1&2 Ab, 4th Generation: NONREACTIVE

## 2020-05-07 ENCOUNTER — Telehealth: Payer: Self-pay

## 2020-05-07 NOTE — Telephone Encounter (Signed)
Patient called in wondering where we sent the Dexcom prescription, looked at the prescription and seen it was sent on 4/12 to [facility]. Christen Bame states he got off the phone with the pharmacy and they told the patient that he would have to pay roughly $340-$400 even with filing with insurance.

## 2020-05-07 NOTE — Telephone Encounter (Signed)
Called patient let know ready for pick up. Has requested we leave at reception for pick up. Have done so and copy sent to scan.

## 2020-05-15 NOTE — Telephone Encounter (Signed)
I have called dexcom verified information received by our office. They have been trying to contact patient to verify insurance information and get set up I have called patient and gave number he will call and talk with them.   [phone]

## 2020-05-31 NOTE — Telephone Encounter (Signed)
Form faxed no further action needed  

## 2020-05-31 NOTE — Telephone Encounter (Signed)
Jude with [employer] called and LVM on triage line requesting a follow up call regarding Dexcom. Number is [phone] Ext. [phone].

## 2020-05-31 NOTE — Telephone Encounter (Signed)
Completed and placed In Joellen's inbox

## 2020-06-06 ENCOUNTER — Telehealth: Payer: Self-pay

## 2020-06-06 NOTE — Telephone Encounter (Signed)
[place] [facility] Night - Client Nonclinical Telephone Record AccessNurse Client [place] [facility] Night - Client Client Site [facility] [place] - Night Physician Vernona Rieger - NP Contact Type Call Who Is Calling Physician / Provider / Hospital Call Type Provider Call [facility] Page Now Reason for Call Request to speak to Physician Initial Comment Caller is from [facility] about patient Kenneth Cohen 06-26-1965 Additional Comment There is no need to call back if clinical notes can be faxed back that were sent. [phone], reference number is [id number]. Called [facility], spoke to representative who stated no need to speak to the On Call. Message can be faxed to the office. Patient Name Kenneth Cohen Patient DOB [birth year] Requesting Provider Vernona Rieger Physician Number Caller's number is [phone] ext. [phone] Facility Name [facility] Disp. Time Disposition Final User 06/05/2020 5:23:42 PM Send to [facility] [address] Ronnell Guadalajara 06/05/2020 5:39:39 PM Page Completed Yes Macario Carls Call Closed By: Macario Carls Transaction Date/Time: 06/05/2020 5:17:54 PM (ET)

## 2020-06-10 NOTE — Telephone Encounter (Signed)
sheina called in from [facility] and she checking on the diabetic clinical note for Kenneth Cohen due to he is trying to get the Dexcom meter and sensor.  And they sent paperwork on 5/10 and havent heard anything from it.   Please advise

## 2020-06-14 NOTE — Telephone Encounter (Signed)
And wanted to know he takes shots 5 times a day and he checks his sugar everyday

## 2020-06-14 NOTE — Telephone Encounter (Signed)
Kenneth Cohen been waiting for a month for his dexcom and they are waiting for the Dr. to call them and how often he taakes his shots, and they stated that they called and never gotten anyone to call them back.  And if he didn't call they were going to cancel the order

## 2020-06-14 NOTE — Telephone Encounter (Signed)
Called [facility] and according them they stated that the notes that they have are not good enough and that they sent a request which they called an addendum note for there to be more information sent to them. The employee I spoke to stated that she is going to have them send the paperwork again as to what needs to be sent to them.

## 2020-06-18 NOTE — Telephone Encounter (Signed)
Pt called in to check status on this. He states he called [facility] this morning to see what was going on. I advised him that they were going to send the paperwork over to get the information necessary. He was frustrated as it has taken so long to get this taken care of. I did apologize and let him know I would send a message over to see if we received the paperwork needed. Please advise.

## 2020-06-18 NOTE — Telephone Encounter (Signed)
Call [facility]. Per their request we have faxed notes to number given. Called patient and let know.   Will need to call and follow up with [facility] tomorrow.

## 2020-06-21 NOTE — Telephone Encounter (Signed)
Patient called stating that Kenneth Cohen states there is information missing that they need still for dexcom he is trying to get. Per patient the last thing they would need to know is how much is patient using of insulin. He is not sure if that information was sent over to them yet or not. Please call patient when able to discuss.

## 2020-06-21 NOTE — Telephone Encounter (Signed)
Just to clarify, I need to addend the office note to state "frequent adjustment of insulin"? Or is this for the prescription? Assessment and plan? Or Anywhere?

## 2020-06-21 NOTE — Telephone Encounter (Signed)
Called [facility] per rep note from 4/12 needs to be updated  and must include the statement "Frequent Adjustment of insulin"   We will need to fax to  [phone] Account number [id number]

## 2020-06-23 NOTE — Telephone Encounter (Signed)
Note needs to be amended  to state frequent adjustment of insulin

## 2020-06-25 NOTE — Telephone Encounter (Signed)
Have faxed hold to follow up on this in a few day.

## 2020-06-25 NOTE — Telephone Encounter (Signed)
Done

## 2020-07-05 NOTE — Telephone Encounter (Signed)
Kenneth Cohen called in wanted to know about getting his been trying to use his dexcom for 2 months  and wanted to know who is going to pay for it. [facility] wanted to know about getting medical notes.

## 2020-07-08 NOTE — Telephone Encounter (Signed)
See open phone note for documentation on this

## 2020-07-08 NOTE — Telephone Encounter (Signed)
Called [facility] states notes still not received given new fax number to send to. Listed below have faxed. Will send patient message letting them know as well.   Fax [phone] Acc [phone]

## 2020-07-11 ENCOUNTER — Telehealth: Payer: Self-pay

## 2020-07-11 NOTE — Telephone Encounter (Signed)
New message    Patient checking on the status of Dexcom.    Dexcom is asking how many times the patient stick himself.

## 2020-07-17 NOTE — Telephone Encounter (Signed)
PLEASE NOTE: All timestamps contained within this report are represented as [place] Standard Time. CONFIDENTIALTY NOTICE: This fax transmission is intended only for the addressee. It contains information that is legally privileged, confidential or otherwise protected from use or disclosure. If you are not the intended recipient, you are strictly prohibited from reviewing, disclosing, copying using or disseminating any of this information or taking any action in reliance on or regarding this information. If you have received this fax in error, please notify us immediately by telephone so that we can arrange for its return to [place]. Phone: [phone], Toll-Free: [phone], Fax: [phone] Page: 1 of 1 Call Id: [id number] [facility] Night - Client Nonclinical Telephone Record  [facility]Nurse Client [place] [facility] Night - Client Client Site  [facility] [place] - Night Physician Vernona Rieger - NP Contact Type Call Who Is Calling Physician / Provider / Hospital Call Type Provider Call Message Only Reason for Call Request to send message to Office Initial Comment Caller is [place] [employer]. She is calling to f/u on request sent. Additional Comment Georges Mouse., [employer]. Re Patient Pervis Macintyre DOB 12/04/1965. [phone], ext [phone]. Disp. Time Disposition Final User 07/16/2020 6:05:43 PM General Information Provided Yes Lopez-Craine, Dahlia Call Closed By: Chalmers Guest Transaction Date/Time: 07/16/2020 6:02:10 PM (ET)

## 2020-07-19 ENCOUNTER — Telehealth: Payer: Self-pay | Admitting: Primary Care

## 2020-07-19 NOTE — Telephone Encounter (Signed)
John called in from [facility] and stated that they are needing doctors note from the last office visit for diabetic supplies. The fax [phone].  [phone] ext [phone]

## 2020-07-19 NOTE — Telephone Encounter (Signed)
Kenneth Cohen called in and wanted to know what is going on due to he has been getting the run arounds with getting his supplies. They would say that everything is ready to be shipped but never send it out.

## 2020-07-24 NOTE — Telephone Encounter (Signed)
John called in from [facility] and stated that they are needing notes from 04/30/20 and they are needing frequency changes/ adjustment.

## 2020-07-29 ENCOUNTER — Telehealth: Payer: Self-pay

## 2020-07-29 NOTE — Telephone Encounter (Signed)
Lorre Munroe with [facility] medical left v/m requesting status of 07/09/20 fax for addendum request for additional info of how pt is adjusting insulin with glucose readings.  Use ref # [id number].   Fax # is [phone]. Sending note to [facility] CMA.

## 2020-07-30 NOTE — Telephone Encounter (Signed)
Did patient actually receive the continuous glucose monitor? I have not see patient since April 2022 so there is no new documentation.  Specifically what are they wanting?

## 2020-08-10 NOTE — Telephone Encounter (Signed)
I do not see the "new fax" you placed in my folder.

## 2020-09-27 ENCOUNTER — Ambulatory Visit: Payer: Medicare Other | Admitting: Primary Care

## 2020-10-20 ENCOUNTER — Telehealth: Payer: Medicare Other | Admitting: Nurse Practitioner

## 2020-10-20 DIAGNOSIS — B9689 Other specified bacterial agents as the cause of diseases classified elsewhere: Secondary | ICD-10-CM

## 2020-10-20 DIAGNOSIS — J019 Acute sinusitis, unspecified: Secondary | ICD-10-CM

## 2020-10-20 MED ORDER — DOXYCYCLINE HYCLATE 100 MG PO TABS: 100.0000 mg | ORAL_TABLET | Freq: Two times a day (BID) | ORAL | 0 refills | Status: DC

## 2020-10-20 MED ORDER — FLUTICASONE PROPIONATE 50 MCG/ACT NA SUSP: 2.0000 | Freq: Every day | NASAL | 6 refills | Status: DC

## 2020-10-20 NOTE — Progress Notes (Signed)
E-Visit for Sinus Problems  We are sorry that you are not feeling well.  Here is how we plan to help!  Based on what you have shared with me it looks like you have sinusitis.  Sinusitis is inflammation and infection in the sinus cavities of the head.  Based on your presentation I believe you most likely have Acute Bacterial Sinusitis.  This is an infection caused by bacteria and is treated with antibiotics. I have prescribed Doxycycline 100mg  by mouth twice a day for 10 days. Along with a flonase spray. You may use an oral decongestant such as Mucinex D or if you have glaucoma or high blood pressure use plain Mucinex. Saline nasal spray help and can safely be used as often as needed for congestion.  If you develop worsening sinus pain, fever or notice severe headache and vision changes, or if symptoms are not better after completion of antibiotic, please schedule an appointment with a health care provider.    Sinus infections are not as easily transmitted as other respiratory infection, however we still recommend that you avoid close contact with loved ones, especially the very young and elderly.  Remember to wash your hands thoroughly throughout the day as this is the number one way to prevent the spread of infection!  Home Care: Only take medications as instructed by your medical team. Complete the entire course of an antibiotic. Do not take these medications with alcohol. A steam or ultrasonic humidifier can help congestion.  You can place a towel over your head and breathe in the steam from hot water coming from a faucet. Avoid close contacts especially the very young and the elderly. Cover your mouth when you cough or sneeze. Always remember to wash your hands.  Get Help Right Away If: You develop worsening fever or sinus pain. You develop a severe head ache or visual changes. Your symptoms persist after you have completed your treatment plan.  Make sure you Understand these  instructions. Will watch your condition. Will get help right away if you are not doing well or get worse.  Thank you for choosing an e-visit.  Your e-visit answers were reviewed by a board certified advanced clinical practitioner to complete your personal care plan. Depending upon the condition, your plan could have included both over the counter or prescription medications.  Please review your pharmacy choice. Make sure the pharmacy is open so you can pick up prescription now. If there is a problem, you may contact your provider through and have the prescription routed to another pharmacy.  Your safety is important to [employer]. If you have drug allergies check your prescription carefully.   For the next 24 hours you can use MyChart to ask questions about today's visit, request a non-urgent call back, or ask for a work or school excuse. You will get an email in the next two days asking about your experience. I hope that your e-visit has been valuable and will speed your recovery.

## 2020-10-20 NOTE — Progress Notes (Signed)
I have spent 5 minutes in review of e-visit questionnaire, review and updating patient chart, medical decision making and response to patient.  ° °Laakea Pereira W Renato Spellman, NP ° °  °

## 2020-10-25 ENCOUNTER — Telehealth: Payer: Self-pay

## 2020-10-25 ENCOUNTER — Ambulatory Visit
Admission: RE | Admit: 2020-10-25 | Discharge: 2020-10-25 | Disposition: A | Discharge status: Home / Self Care | Payer: Medicare Other | Source: Ambulatory Visit | Attending: Emergency Medicine | Admitting: Emergency Medicine

## 2020-10-25 ENCOUNTER — Other Ambulatory Visit: Payer: Self-pay

## 2020-10-25 VITALS — BP 101/66 | HR 111 | Temp 97.9°F | Resp 22

## 2020-10-25 DIAGNOSIS — Z794 Long term (current) use of insulin: Secondary | ICD-10-CM

## 2020-10-25 DIAGNOSIS — E119 Type 2 diabetes mellitus without complications: Secondary | ICD-10-CM | POA: Diagnosis not present

## 2020-10-25 DIAGNOSIS — B351 Tinea unguium: Secondary | ICD-10-CM

## 2020-10-25 DIAGNOSIS — E1165 Type 2 diabetes mellitus with hyperglycemia: Secondary | ICD-10-CM

## 2020-10-25 DIAGNOSIS — E118 Type 2 diabetes mellitus with unspecified complications: Secondary | ICD-10-CM | POA: Diagnosis not present

## 2020-10-25 LAB — POCT URINALYSIS DIP (MANUAL ENTRY)
Glucose, UA: NEGATIVE mg/dL
Ketones, POC UA: NEGATIVE mg/dL
Leukocytes, UA: NEGATIVE
Nitrite, UA: NEGATIVE
Protein Ur, POC: 30 mg/dL — AB
Spec Grav, UA: 1.02 (ref 1.010–1.025)
Urobilinogen, UA: 4 E.U./dL — AB
pH, UA: 5.5 (ref 5.0–8.0)

## 2020-10-25 LAB — POCT FASTING CBG KUC MANUAL ENTRY: POCT Glucose (KUC): 156 mg/dL — AB (ref 70–99)

## 2020-10-25 MED ORDER — MOXIFLOXACIN HCL 400 MG PO TABS: 400.0000 mg | ORAL_TABLET | Freq: Every day | ORAL | 0 refills | Status: AC

## 2020-10-25 MED ORDER — SULFAMETHOXAZOLE-TRIMETHOPRIM 800-160 MG PO TABS: 1.0000 | ORAL_TABLET | Freq: Two times a day (BID) | ORAL | 0 refills | Status: AC

## 2020-10-25 MED ORDER — TERBINAFINE HCL 250 MG PO TABS: 250.0000 mg | ORAL_TABLET | Freq: Every day | ORAL | 0 refills | Status: DC

## 2020-10-25 MED ORDER — CEFTRIAXONE SODIUM 1 G IJ SOLR
2.0000 g | Freq: Once | INTRAMUSCULAR | Status: AC
  Administered 2020-10-25: 2 g via INTRAMUSCULAR

## 2020-10-25 MED ORDER — DIPHENHYDRAMINE HCL 50 MG PO CAPS
50.0000 mg | ORAL_CAPSULE | Freq: Once | ORAL | Status: AC
  Administered 2020-10-25: 50 mg via ORAL

## 2020-10-25 NOTE — ED Provider Notes (Signed)
[facility]    CSN: [id number] Arrival date & time: 10/25/20  1150      History   Chief Complaint Chief Complaint  Patient presents with   Foot Pain    HPI Kenneth Cohen is a 55 y.o. male.   New patient  Patient reports being a diabetic and having a wound to right lateral foot. Patient states he does not know why the wound has popped up.  Last A1c was greater than 9.  Patient weighs 379 pounds.  Patient also has a prior diabetic ulcer on the lateral aspect of his right foot that he has been nursing for the last several weeks, states that he has nearly gotten it healed on its own.  Patient also has left AKA, states that this occurred because he had Charcot in his left ankle but does not believe it was due to his uncontrolled diabetes.  Patient is here with his wife who has been providing consistent wound care with skin moisturizers and performing dressing changes using Medihoney.  The history is provided by the patient.      Foot is wrapped with guaze. Past Medical History:  Diagnosis Date   Diabetic Charcot foot (HCC)    Hypertension    Obesity    Testicular torsion    as a child    Patient Active Problem List   Diagnosis Date Noted   Erectile dysfunction 03/29/2019   Adjustment disorder with anxiety 08/19/2016   Charcot's joint, left ankle and foot 09/07/2015   Chronic venous insufficiency 07/16/2015   Preventative health care 10/02/2014   Hyperlipidemia 10/02/2014   Neuropathy associated with endocrine disorder (HCC) 10/02/2014   Essential hypertension 06/12/2014   Obesity (BMI 30-39.9) 06/12/2014   Diabetes mellitus type 2 with complications (HCC) 06/12/2014    History reviewed. No pertinent surgical history.     Home Medications    Prior to Admission medications   Medication Sig Start Date End Date Taking? Authorizing Provider  moxifloxacin (AVELOX) 400 MG tablet Take 1 tablet (400 mg total) by mouth daily at 8 pm for 14 days. 10/25/20  11/08/20 Yes Theadora Rama Scales, PA-C  sulfamethoxazole-trimethoprim (BACTRIM DS) 800-160 MG tablet Take 1 tablet by mouth 2 (two) times daily for 14 days. 10/25/20 11/08/20 Yes Theadora Rama Scales, PA-C  terbinafine (LAMISIL) 250 MG tablet Take 1 tablet (250 mg total) by mouth daily. 10/25/20 01/23/21 Yes Theadora Rama Scales, PA-C  amLODipine (NORVASC) 10 MG tablet Take 1 tablet (10 mg total) by mouth daily. For blood pressure. 04/30/20   Doreene Nest, NP  atorvastatin (LIPITOR) 20 MG tablet Take 1 tablet (20 mg total) by mouth daily. For cholesterol. 04/30/20   Doreene Nest, NP  Continuous Blood Gluc Sensor (DEXCOM G6 SENSOR) MISC 1 each by Does not apply route as directed. 04/30/20   Doreene Nest, NP  Continuous Blood Gluc Transmit (DEXCOM G6 TRANSMITTER) MISC 1 each by Does not apply route as directed. 04/30/20   Doreene Nest, NP  fluticasone (FLONASE) 50 MCG/ACT nasal spray Place 2 sprays into both nostrils daily. 10/20/20   Claiborne Rigg, NP  hydrochlorothiazide (HYDRODIURIL) 25 MG tablet Take 1 tablet (25 mg total) by mouth daily. For blood pressure. 04/30/20   Doreene Nest, NP  insulin NPH Human (NOVOLIN N RELION) 100 UNIT/ML injection Inject 50 units into the skin twice daily. Patient taking differently: Inject 60 units into the skin twice daily. 03/29/19   Doreene Nest, NP  insulin regular (NOVOLIN  R RELION) 100 units/mL injection Inject 35 units into the skin three times daily before meals for blood sugars greater than 100. Patient taking differently: Inject 50 units into the skin three times daily before meals for blood sugars greater than 100. 03/29/19   Doreene Nest, NP  metFORMIN (GLUCOPHAGE) 500 MG tablet Take 2 tablets (1,000 mg total) by mouth 2 (two) times daily with a meal. For diabetes. 04/30/20   Doreene Nest, NP  olmesartan (BENICAR) 40 MG tablet Take 1 tablet (40 mg total) by mouth daily. For blood pressure. 04/30/20   Doreene Nest, NP  sildenafil (REVATIO) 20 MG tablet Take 2 tablets (40 mg total) by mouth as needed (30 minutes to 1 hour before intercourse). 03/29/19   Doreene Nest, NP    Family History Family History  Problem Relation Age of Onset   Hypertension Mother    Diabetes Mother    Diabetes Father     Social History Social History   Tobacco Use   Smoking status: Former    Types: Cigarettes    Quit date: 01/20/2004    Years since quitting: 16.7   Smokeless tobacco: Never  Substance Use Topics   Alcohol use: No    Alcohol/week: 0.0 standard drinks    Comment: rarely   Drug use: No     Allergies   Cefaclor and Iodinated diagnostic agents   Review of Systems Review of Systems Pertinent findings noted in history of present illness.    Physical Exam Triage Vital Signs ED Triage Vitals  Enc Vitals Group     BP      Pulse      Resp      Temp      Temp src      SpO2      Weight      Height      Head Circumference      Peak Flow      Pain Score      Pain Loc      Pain Edu?      Excl. in GC?    No data found.  Updated Vital Signs BP 101/66 (BP Location: Right Arm)   Pulse (!) 111   Temp 97.9 F (36.6 C) (Oral)   Resp (!) 22   SpO2 100%   Visual Acuity Right Eye Distance:   Left Eye Distance:   Bilateral Distance:    Right Eye Near:   Left Eye Near:    Bilateral Near:     Physical Exam Vitals and nursing note reviewed.  Constitutional:      Appearance: Normal appearance. He is obese.  HENT:     Head: Normocephalic and atraumatic.     Right Ear: Tympanic membrane, ear canal and external ear normal.     Left Ear: Tympanic membrane, ear canal and external ear normal.     Nose: Nose normal.     Mouth/Throat:     Mouth: Mucous membranes are moist.     Pharynx: Oropharynx is clear.  Eyes:     Extraocular Movements: Extraocular movements intact.     Conjunctiva/sclera: Conjunctivae normal.     Pupils: Pupils are equal, round, and reactive to  light.  Cardiovascular:     Rate and Rhythm: Normal rate and regular rhythm.     Heart sounds: Normal heart sounds.  Pulmonary:     Effort: Pulmonary effort is normal.     Breath sounds: Normal breath sounds.  Musculoskeletal:  General: Swelling (Significant erythema and edema of right lower extremity to tibial tuberosity) and tenderness (Right lower extremity) present. Normal range of motion.     Cervical back: Normal range of motion and neck supple.     Right lower leg: Edema (3+) present.     Comments: Left AKA  Skin:    General: Skin is warm and dry.     Findings: Erythema and lesion (3 diabetic wounds appreciated on lateral aspect of bottom of right foot, all deep to dermis) present.  Neurological:     General: No focal deficit present.     Mental Status: He is alert and oriented to person, place, and time.  Psychiatric:        Mood and Affect: Mood normal.        Behavior: Behavior normal.     UC Treatments / Results  Labs (all labs ordered are listed, but only abnormal results are displayed) Labs Reviewed  POCT FASTING CBG [facility] MANUAL ENTRY - Abnormal; Notable for the following components:      Result Value   POCT Glucose ([facility]) 156 (*)    All other components within normal limits  POCT URINALYSIS DIP (MANUAL ENTRY) - Abnormal; Notable for the following components:   Bilirubin, UA small (*)    Blood, UA trace-intact (*)    Protein Ur, POC =30 (*)    Urobilinogen, UA 4.0 (*)    All other components within normal limits    EKG   Radiology No results found.  Procedures Procedures (including critical care time)  Medications Ordered in UC Medications  cefTRIAXone (ROCEPHIN) injection 2 g (2 g Intramuscular Given 10/25/20 1429)  diphenhydrAMINE (BENADRYL) capsule 50 mg (50 mg Oral Given 10/25/20 1424)    Initial Impression / Assessment and Plan / UC Course  I have reviewed the triage vital signs and the nursing notes.  Pertinent labs & imaging results that  were available during my care of the patient were reviewed by me and considered in my medical decision making (see chart for details).     Patient has very poorly controlled type 2 diabetes, significant time trying to educate him that the reason he is getting these ulcers on his right foot as well as the reason he lost his leg is due to poor circulation secondary to poorly controlled diabetes.  I also advised him that his malformed toenails are due to fungal infection again secondary to poorly controlled type 2 diabetes.  Patient states he is currently taking diuretic as prescribed.  Patient advised that I am not planning on altering his diabetes regimen however I do recommend that he follows up with his diabetes provider soon as possible for further evaluation and review of his regimen as well as formulating plans to increase his success in managing his type 2 diabetes.  Patient's wounds were evaluated and redressed.  At this time, I recommend that patient begin dual therapy for presumed bacterial infection of diabetic wounds, I also provided him with a prescription for 9 days of terbinafine to resolve his toenail infection.  Patient and wife are educated to perform dressing changes daily and to continue using Medihoney. Patient verbalized understanding and agreement of plan as discussed.  All questions were addressed during visit.  Please see discharge instructions below for further details of plan.  Final Clinical Impressions(s) / UC Diagnoses   Final diagnoses:  Uncontrolled type 2 diabetes mellitus with hyperglycemia (HCC)  Type 2 diabetes with complication (HCC)  Insulin dependent type  2 diabetes mellitus (HCC)  Onychomycosis     Discharge Instructions      The wound on your right foot is due to poor circulation and history of uncontrolled diabetes.  It is important that you continue managing your blood sugars very carefully while you are waiting for this to heal.  Your morning fasting  blood sugar level should be around 80, definitely below 120 and your postmeal blood sugar, checked 2 hours after you begin eating your meal, should be no higher than 180.  If you are meeting these goals, you can rest assured that her diabetes is very well controlled please continue taking all of your medications as prescribed.  Please work on Runner, broadcasting/film/video, particularly upper body strength as well as strengthening of your lower leg with leg extensions and leg presses, this will help you burn more sugar at a resting rate as well as when you are active.  Please avoid all foods that are high in carbohydrates including starchy foods such as pasta, potatoes white rice and white bread, all sodas whether they are regular or diet, any sweet foods and all processed foods that come in a critically back or through a drive-through window.  Received an injection of an antibiotic called ceftriaxone today, 1 in each buttock that contained 1 g of this medication.  Because of your history of allergy to Ceclor, we provided you with Benadryl 50 mg as a safety precaution.  I have also provided you with prescriptions for 2 antibiotics to take for the next 14 days, Bactrim 1 tablet twice daily and Avelox 1 tablet daily.  Please make an appointment to follow-up with your primary care provider as soon as possible, hopefully within the next 3 days to have your wound evaluated and discuss further treatment options.  Please dress your wound twice daily using Medihoney, a sterile dry bandage wrapped with Kerlix, and tape/Coban to secure.  Please monitor wound for increasing surrounding redness, increasing purulent drainage, increased swelling, increased heat.  These are all reasons to go to the emergency room for more aggressive treatment of infection.  For your toenails, I have prescribed terbinafine which is an antifungal medication, please take 1 tablet daily.  The course of care is 3 months and approximately 2 months after you  finish the 75-month treatment, you should see healthy nails begin to grow.  Thank you for visiting urgent care today.  I hope you feel better soon.     ED Prescriptions     Medication Sig Dispense Auth. Provider   sulfamethoxazole-trimethoprim (BACTRIM DS) 800-160 MG tablet Take 1 tablet by mouth 2 (two) times daily for 14 days. 28 tablet Theadora Rama Scales, PA-C   moxifloxacin (AVELOX) 400 MG tablet Take 1 tablet (400 mg total) by mouth daily at 8 pm for 14 days. 14 tablet Theadora Rama Scales, PA-C   terbinafine (LAMISIL) 250 MG tablet Take 1 tablet (250 mg total) by mouth daily. 90 tablet Theadora Rama Scales, [place]      PDMP not reviewed this encounter.   Theadora Rama Scales, PA-C 10/27/20 1308

## 2020-10-25 NOTE — Telephone Encounter (Signed)
Noted. Agree pt needs to be seen today at Urgent care.

## 2020-10-25 NOTE — Telephone Encounter (Signed)
Kenneth Cohen, Kenneth Cohen  [facility] (supporting Vernona Rieger K, NP) 1 hour ago (8:08 AM)   TL this is about Kenneth Cohen. he haves a foot ulcer that is much better.now have a infection on the side of his foot.i have been cleaning and getting some infection out but cant get all out. when he runs a fever is  low fever and not has one since sunday night.his sugar levels have been good. i know Jae Dire is out of the office.Any help will be appreciated   Thanks Kenneth Cohen

## 2020-10-25 NOTE — ED Triage Notes (Signed)
Patient reports being a diabetic and having a wound to right lateral foot. Patient states he does not know why the wound  has popped up.   Foot is wrapped with guaze.

## 2020-10-25 NOTE — Discharge Instructions (Addendum)
The wound on your right foot is due to poor circulation and history of uncontrolled diabetes.  It is important that you continue managing your blood sugars very carefully while you are waiting for this to heal.  Your morning fasting blood sugar level should be around 80, definitely below 120 and your postmeal blood sugar, checked 2 hours after you begin eating your meal, should be no higher than 180.  If you are meeting these goals, you can rest assured that her diabetes is very well controlled please continue taking all of your medications as prescribed.  Please work on Runner, broadcasting/film/video, particularly upper body strength as well as strengthening of your lower leg with leg extensions and leg presses, this will help you burn more sugar at a resting rate as well as when you are active.  Please avoid all foods that are high in carbohydrates including starchy foods such as pasta, potatoes white rice and white bread, all sodas whether they are regular or diet, any sweet foods and all processed foods that come in a critically back or through a drive-through window.  Received an injection of an antibiotic called ceftriaxone today, 1 in each buttock that contained 1 g of this medication.  Because of your history of allergy to Ceclor, we provided you with Benadryl 50 mg as a safety precaution.  I have also provided you with prescriptions for 2 antibiotics to take for the next 14 days, Bactrim 1 tablet twice daily and Avelox 1 tablet daily.  Please make an appointment to follow-up with your primary care provider as soon as possible, hopefully within the next 3 days to have your wound evaluated and discuss further treatment options.  Please dress your wound twice daily using Medihoney, a sterile dry bandage wrapped with Kerlix, and tape/Coban to secure.  Please monitor wound for increasing surrounding redness, increasing purulent drainage, increased swelling, increased heat.  These are all reasons to go to the emergency  room for more aggressive treatment of infection.  For your toenails, I have prescribed terbinafine which is an antifungal medication, please take 1 tablet daily.  The course of care is 3 months and approximately 2 months after you finish the 56-month treatment, you should see healthy nails begin to grow.  Thank you for visiting urgent care today.  I hope you feel better soon.

## 2020-10-25 NOTE — Telephone Encounter (Signed)
I spoke with pts wife; pt has a skin ulcer on side of lt foot; pts wife said she has gotten some infection out of ulcer but there is more there. The sore on side of foot has been there 2 wks. There is no redness and pt does not have a fever. Pt said there is a purulent drainage all the time from the sore on lt foot.Pt said he has been elevating his legs due to swelling in lower legs. Pts wife has been putting lotion on feet which has softened the feet. Pt does not know what has caused the sore on the lt foot. Pts wife has done everything to get rid of area on side of foot. The ulcer on bottom of foot is almost gone. No available appts at [facility] and pt is diabetic. Pt scheduled appt at [facility] on [address] today at 01/02/21. UC & ED precautions given and pt voiced understanding and appreciative of appt. Pt has not seen wound specialist before. Sending note to Allayne Gitelman NP who is out of office and Dr Ermalene Searing who is in office and [place] CMA.

## 2020-10-26 NOTE — Telephone Encounter (Signed)
Patient is overdue for diabetes follow up. He did not schedule a 3 month follow up visit from his visit in April 2022 and his appointment for September 2022 was cancelled.  Needs to be seen ASAP. Put him at the end of a session.

## 2020-10-28 NOTE — Telephone Encounter (Signed)
Called number rang several times then went busy.

## 2020-11-01 NOTE — Telephone Encounter (Signed)
Called patient left message to call. Sent my chart as well.

## 2020-11-06 NOTE — Telephone Encounter (Signed)
Have called x 3 and sent my chart. Call now number is not working at all.

## 2020-11-06 NOTE — Telephone Encounter (Signed)
Looks like he has an appointment scheduled for later this month.  Thanks for calling.

## 2020-11-10 ENCOUNTER — Encounter: Payer: Self-pay | Admitting: Primary Care

## 2020-11-12 ENCOUNTER — Ambulatory Visit (INDEPENDENT_AMBULATORY_CARE_PROVIDER_SITE_OTHER): Payer: Medicare Other | Admitting: Primary Care

## 2020-11-12 ENCOUNTER — Other Ambulatory Visit: Payer: Self-pay

## 2020-11-12 VITALS — BP 122/68 | HR 113 | Temp 97.7°F | Ht 78.0 in | Wt 341.0 lb

## 2020-11-12 DIAGNOSIS — Z794 Long term (current) use of insulin: Secondary | ICD-10-CM

## 2020-11-12 DIAGNOSIS — E11621 Type 2 diabetes mellitus with foot ulcer: Secondary | ICD-10-CM | POA: Insufficient documentation

## 2020-11-12 DIAGNOSIS — L97512 Non-pressure chronic ulcer of other part of right foot with fat layer exposed: Secondary | ICD-10-CM

## 2020-11-12 DIAGNOSIS — Z89512 Acquired absence of left leg below knee: Secondary | ICD-10-CM | POA: Diagnosis not present

## 2020-11-12 DIAGNOSIS — E118 Type 2 diabetes mellitus with unspecified complications: Secondary | ICD-10-CM | POA: Diagnosis not present

## 2020-11-12 DIAGNOSIS — L97502 Non-pressure chronic ulcer of other part of unspecified foot with fat layer exposed: Secondary | ICD-10-CM | POA: Insufficient documentation

## 2020-11-12 HISTORY — DX: Type 2 diabetes mellitus with foot ulcer: E11.621

## 2020-11-12 LAB — POCT GLYCOSYLATED HEMOGLOBIN (HGB A1C): Hemoglobin A1C: 6.8 % — AB (ref 4.0–5.6)

## 2020-11-12 MED ORDER — INSULIN NPH (HUMAN) (ISOPHANE) 100 UNIT/ML ~~LOC~~ SUSP: SUBCUTANEOUS | 5 refills | Status: DC

## 2020-11-12 MED ORDER — INSULIN REGULAR HUMAN 100 UNIT/ML IJ SOLN: INTRAMUSCULAR | 5 refills | Status: DC

## 2020-11-12 NOTE — Assessment & Plan Note (Signed)
Improved with A1C of 6.8 today!!  Continue Novolin N 50 units BID and Novolin R 40 units TID with meals.  He will update if glucose readings consistently remain above 150.   Foot exam today. Eye exam today.  Managed on statin and ARB.  Stressed the importance of regular diabetes follow up.

## 2020-11-12 NOTE — Assessment & Plan Note (Signed)
Stable. ? ?No concerns today. ?

## 2020-11-12 NOTE — Progress Notes (Signed)
Subjective:    Patient ID: Kenneth Cohen, male    DOB: July 25, 1965, 55 y.o.   MRN: [id number]  HPI  Kenneth Cohen is a very pleasant 55 y.o. male with a history of uncontrolled type 2 diabetes, hypertension, hyperlipidemia, left BKA, neuropathy, who presents today for follow-up of diabetes.  Current medications include: Metformin 1000 mg BID, Novlin NPH 60 units BID, Novolin R 50 units TID with meals, Trulicity 0.75 mg weekly.   He changed his Novolin NPH to 50 units BID and Novolin 40 units TID a few weeks ago due to drops in glucose below 90. He could not afford Trulicity, too expensive.   He is checking his blood glucose multiple times daily and is getting readings of low to mid 100's over the last few weeks. He was able to get Dexcom covered and since then has seen much better glucose readings.   Last A1C: 9.2 in April 2022, 6.8 today.  Last Eye Exam: Will obtain today Last Foot Exam: Due Pneumonia Vaccination: 2017 Urine Microalbumin:  None. On ARB Statin: Atorvastatin  Dietary changes since last visit: He's cut back on portion sizes. Is eating mostly take out food, some home cooked meals.   Exercise: No regular exercise. He is mostly sedentary throughout the day.    Evaluated at Urgent Care in [facility] in 10/25/20 for right lateral foot pain. He was diagnosed with diabetic ulcer, treated with rocephin 1gm, Bactrim DS tablets, Avelox tablets. Treated with terbinafine for onychomycosis, his wife has noticed improvement. He receives no routine foot care. History of left BKA due to charcot foot.  His wife is treating his ulcer with Medihoney and regular dressing changes. He completed both oral antibiotic regimens. He denies fevers. He has been working to elevate his leg when resting, has not historically done this. Admits to sedentary lifestyle. Does not see wound clinic but his wife has the contact information for one. His wife endorses the ulcer size is decreasing.    Review of  Systems  Constitutional:  Negative for fever.  Respiratory:  Negative for shortness of breath.   Cardiovascular:  Negative for chest pain.  Skin:  Positive for wound.  Neurological:  Positive for numbness.        Past Medical History:  Diagnosis Date   Diabetic Charcot foot (HCC)    Hypertension    Obesity    Testicular torsion    as a child    Social History   Socioeconomic History   Marital status: Married    Spouse name: Not on file   Number of children: Not on file   Years of education: Not on file   Highest education level: Not on file  Occupational History   Not on file  Tobacco Use   Smoking status: Former    Types: Cigarettes    Quit date: 01/20/2004    Years since quitting: 16.8   Smokeless tobacco: Never  Substance and Sexual Activity   Alcohol use: No    Alcohol/week: 0.0 standard drinks    Comment: rarely   Drug use: No   Sexual activity: Not on file  Other Topics Concern   Not on file  Social History Narrative   Married.   One son, junior in [employer].   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   Enjoys playing on the computer.   Social Determinants of Health   Financial Resource Strain: Not on file  Food Insecurity: Not on file  Transportation Needs: Not on file  Physical Activity: Not on file  Stress: Not on file  Social Connections: Not on file  Intimate Partner Violence: Not on file    No past surgical history on file.  Family History  Problem Relation Age of Onset   Hypertension Mother    Diabetes Mother    Diabetes Father     Allergies  Allergen Reactions   Cefaclor Itching, Swelling and Other (See Comments)    Received and tolerated cefazolin on adm 06/2016  Reaction:  All over body swelling  Received and tolerated cefazolin on adm 06/2016    Iodinated Diagnostic Agents Hives and Itching    Immediately after receiving the iv contrast, patient started itching and developed welts on his forehead, back of head, chest and back.   ER Doctor and RN were notified and patient was told he should always be premedicated prior to receiving IV contrast    Current Outpatient Medications on File Prior to Visit  Medication Sig Dispense Refill   amLODipine (NORVASC) 10 MG tablet Take 1 tablet (10 mg total) by mouth daily. For blood pressure. 90 tablet 3   atorvastatin (LIPITOR) 20 MG tablet Take 1 tablet (20 mg total) by mouth daily. For cholesterol. 90 tablet 3   Continuous Blood Gluc Sensor (DEXCOM G6 SENSOR) MISC 1 each by Does not apply route as directed. 9 each 3   Continuous Blood Gluc Transmit (DEXCOM G6 TRANSMITTER) MISC 1 each by Does not apply route as directed. 1 each 3   fluticasone (FLONASE) 50 MCG/ACT nasal spray Place 2 sprays into both nostrils daily. 16 g 6   hydrochlorothiazide (HYDRODIURIL) 25 MG tablet Take 1 tablet (25 mg total) by mouth daily. For blood pressure. 90 tablet 3   insulin NPH Human (NOVOLIN N RELION) 100 UNIT/ML injection Inject 50 units into the skin twice daily. (Patient taking differently: Inject 60 units into the skin twice daily.) 10 mL 5   insulin regular (NOVOLIN R RELION) 100 units/mL injection Inject 35 units into the skin three times daily before meals for blood sugars greater than 100. (Patient taking differently: Inject 50 units into the skin three times daily before meals for blood sugars greater than 100.) 10 mL 5   metFORMIN (GLUCOPHAGE) 500 MG tablet Take 2 tablets (1,000 mg total) by mouth 2 (two) times daily with a meal. For diabetes. 360 tablet 3   olmesartan (BENICAR) 40 MG tablet Take 1 tablet (40 mg total) by mouth daily. For blood pressure. 90 tablet 3   sildenafil (REVATIO) 20 MG tablet Take 2 tablets (40 mg total) by mouth as needed (30 minutes to 1 hour before intercourse). 10 tablet 0   terbinafine (LAMISIL) 250 MG tablet Take 1 tablet (250 mg total) by mouth daily. 90 tablet 0   Current Facility-Administered Medications on File Prior to Visit  Medication Dose Route  Frequency Provider Last Rate Last Admin   mupirocin cream (BACTROBAN) 2 %   Topical BID Hyatt, Max T, DPM        BP 122/68   Pulse (!) 113   Temp 97.7 F (36.5 C) (Temporal)   Ht 6\' 6"  (1.981 m)   Wt (!) 341 lb (154.7 kg)   SpO2 96%   BMI 39.41 kg/m  Objective:   Physical Exam Cardiovascular:     Rate and Rhythm: Normal rate and regular rhythm.  Pulmonary:     Effort: Pulmonary effort is normal.     Breath sounds: Normal breath sounds. No wheezing or rales.  Musculoskeletal:  Cervical back: Neck supple.  Skin:    General: Skin is warm and dry.     Findings: Erythema present.     Comments: Ulcer to right lateral foot.  Clear/whitish drainage.  Scaling of skin to right foot.  Onychomycosis to right toenails. All toenails. Unable to appreciate a good pulse to right foot, moderate pedal edema.   Neurological:     Mental Status: He is alert and oriented to person, place, and time.          Assessment & Plan:      This visit occurred during the SARS-CoV-2 public health emergency.  Safety protocols were in place, including screening questions prior to the visit, additional usage of staff PPE, and extensive cleaning of exam room while observing appropriate contact time as indicated for disinfecting solutions.

## 2020-11-12 NOTE — Patient Instructions (Addendum)
Continue Novolin N 50 units twice daily,  Continue Novolin R 40 units three times daily with meals.  Elevate your foot.  Please connect with the wound doctor. Please consider blood flow testing to right foot.  Please schedule a follow up visit for 3 months.  It was a pleasure to see you today!

## 2020-11-12 NOTE — Progress Notes (Signed)
68 

## 2020-11-12 NOTE — Assessment & Plan Note (Signed)
Acute for a few weeks. Improving per wife.  Recommended wound specialist, his wife has the contact number for one in [facility] and will contact.  I also recommended repeat ABI's for which the patient declines. She will inquire with wound care.   Ulcer appears non infectious. Emphasized the importance of routine foot care, elevate leg when resting. Return precautions provided.

## 2020-11-13 ENCOUNTER — Ambulatory Visit: Payer: Medicare Other | Admitting: Primary Care

## 2020-11-14 ENCOUNTER — Telehealth: Payer: Self-pay | Admitting: Primary Care

## 2020-11-14 NOTE — Telephone Encounter (Signed)
[employer] is needing clinical documentation faxed to them to complete the order.  Please advise  Fax: [phone]

## 2020-11-14 NOTE — Telephone Encounter (Signed)
Note faxed to number provided.

## 2020-11-18 ENCOUNTER — Telehealth: Payer: Self-pay

## 2020-11-18 NOTE — Telephone Encounter (Signed)
Called both patient and wife number to let know that form is ready for pick up. Left message to call office. Form placed in my hold folder.   Need to find out if they want faxed or picked up or mailed.

## 2020-11-18 NOTE — Telephone Encounter (Signed)
Patient returned call to our office. Would like to have wife pick up today. Have put in envelope at reception for pick up and copy sent to scan

## 2020-11-21 NOTE — Telephone Encounter (Signed)
Kenneth Cohen with [facility] called stating that they have the notes for 11/12/20 but they also need the notes for 10/25/20.

## 2020-11-21 NOTE — Telephone Encounter (Signed)
Notes sent.

## 2020-11-27 ENCOUNTER — Other Ambulatory Visit (HOSPITAL_COMMUNITY): Payer: Self-pay | Admitting: Internal Medicine

## 2020-11-27 ENCOUNTER — Other Ambulatory Visit: Payer: Self-pay

## 2020-11-27 ENCOUNTER — Encounter: Payer: Medicare Other | Attending: Internal Medicine | Admitting: Internal Medicine

## 2020-11-27 ENCOUNTER — Other Ambulatory Visit: Payer: Self-pay | Admitting: Internal Medicine

## 2020-11-27 DIAGNOSIS — E11621 Type 2 diabetes mellitus with foot ulcer: Secondary | ICD-10-CM | POA: Insufficient documentation

## 2020-11-27 DIAGNOSIS — Z89512 Acquired absence of left leg below knee: Secondary | ICD-10-CM | POA: Insufficient documentation

## 2020-11-27 DIAGNOSIS — L03115 Cellulitis of right lower limb: Secondary | ICD-10-CM | POA: Insufficient documentation

## 2020-11-27 DIAGNOSIS — I10 Essential (primary) hypertension: Secondary | ICD-10-CM | POA: Insufficient documentation

## 2020-11-27 DIAGNOSIS — L97512 Non-pressure chronic ulcer of other part of right foot with fat layer exposed: Secondary | ICD-10-CM | POA: Diagnosis not present

## 2020-11-27 DIAGNOSIS — E11610 Type 2 diabetes mellitus with diabetic neuropathic arthropathy: Secondary | ICD-10-CM | POA: Diagnosis not present

## 2020-11-27 DIAGNOSIS — Z87891 Personal history of nicotine dependence: Secondary | ICD-10-CM | POA: Diagnosis not present

## 2020-11-27 DIAGNOSIS — M869 Osteomyelitis, unspecified: Secondary | ICD-10-CM | POA: Insufficient documentation

## 2020-11-27 DIAGNOSIS — Z794 Long term (current) use of insulin: Secondary | ICD-10-CM | POA: Diagnosis not present

## 2020-11-27 NOTE — Progress Notes (Signed)
Kenneth Cohen, Kenneth Cohen ([id number]) Visit Report for 11/27/2020 Abuse/Suicide Risk Screen Details Patient Name: Kenneth Cohen, Kenneth Cohen Date of Service: 11/27/2020 10:15 AM Medical Record Number: [id number] Patient Account Number: [id number] Date of Birth/Sex: [birth year] (55 y.o. M) Treating RN: Hansel Feinstein Primary Care Kyiesha Millward: Vernona Rieger Other Clinician: Referring Selita Staiger: Vernona Rieger Treating Alton Tremblay/Extender: Tilda Franco in Treatment: 0 Abuse/Suicide Risk Screen Items Answer ABUSE RISK SCREEN: Has anyone close to you tried to hurt or harm you recentlyo No Do you feel uncomfortable with anyone in your familyo No Has anyone forced you do things that you didnot want to doo No Electronic Signature(s) Signed: 11/27/2020 12:18:09 PM By: Hansel Feinstein Entered By: Hansel Feinstein on 11/27/2020 10:48:54 Kenneth Cohen ([id number]) -------------------------------------------------------------------------------- Activities of Daily Living Details Patient Name: Kenneth Cohen Date of Service: 11/27/2020 10:15 AM Medical Record Number: [id number] Patient Account Number: [id number] Date of Birth/Sex: Jun 08, 1965 (55 y.o. M) Treating RN: Hansel Feinstein Primary Care Shatia Sindoni: Vernona Rieger Other Clinician: Referring Teiara Baria: Vernona Rieger Treating Ennio Houp/Extender: Tilda Franco in Treatment: 0 Activities of Daily Living Items Answer Activities of Daily Living (Please select one for each item) Drive Automobile Need Assistance Take Medications Completely Able Use Telephone Completely Able Care for Appearance Completely Able Use Toilet Completely Able Bath / Shower Completely Able Dress Self Completely Able Feed Self Completely Able Walk Completely Able Get In / Out Bed Completely Able Housework Need Assistance Prepare Meals Need Assistance Handle Money Completely Able Shop for Self Need Assistance Electronic Signature(s) Signed: 11/27/2020 12:18:09 PM By: Hansel Feinstein Entered By: Hansel Feinstein on 11/27/2020 10:43:38 Kenneth Cohen ([id number]) -------------------------------------------------------------------------------- Education Screening Details Patient Name: Kenneth Cohen Date of Service: 11/27/2020 10:15 AM Medical Record Number: [id number] Patient Account Number: [id number] Date of Birth/Sex: 1965/09/08 (55 y.o. M) Treating RN: Hansel Feinstein Primary Care Weldon Nouri: Vernona Rieger Other Clinician: Referring Askari Kinley: Vernona Rieger Treating Carmyn Hamm/Extender: Tilda Franco in Treatment: 0 Primary Learner Assessed: Patient Learning Preferences/Education Level/Primary Language Learning Preference: Explanation Highest Education Level: High School Preferred Language: English Cognitive Barrier Language Barrier: No Translator Needed: No Memory Deficit: No Emotional Barrier: No Cultural/Religious Beliefs Affecting Medical Care: No Physical Barrier Impaired Vision: No Impaired Hearing: No Decreased Hand dexterity: No Knowledge/Comprehension Knowledge Level: High Comprehension Level: High Ability to understand written instructions: High Ability to understand verbal instructions: High Motivation Anxiety Level: Calm Cooperation: Cooperative Education Importance: Acknowledges Need Interest in Health Problems: Asks Questions Perception: Coherent Willingness to Engage in Self-Management High Activities: Readiness to Engage in Self-Management High Activities: Electronic Signature(s) Signed: 11/27/2020 12:18:09 PM By: Hansel Feinstein Entered ByHansel Feinstein on 11/27/2020 10:44:39 Kenneth Cohen ([id number]) -------------------------------------------------------------------------------- Fall Risk Assessment Details Patient Name: Kenneth Cohen Date of Service: 11/27/2020 10:15 AM Medical Record Number: [id number] Patient Account Number: [id number] Date of Birth/Sex: [birth year] (55 y.o. M) Treating RN: Hansel Feinstein Primary Care  Quinita Kostelecky: Vernona Rieger Other Clinician: Referring Reita Shindler: Vernona Rieger Treating Nicola Quesnell/Extender: Tilda Franco in Treatment: 0 Fall Risk Assessment Items Have you had 2 or more falls in the last 12 monthso 0 No Have you had any fall that resulted in injury in the last 12 monthso 0 No FALLS RISK SCREEN History of falling - immediate or within 3 months 0 No Secondary diagnosis (Do you have 2 or more medical diagnoseso) 15 Yes Ambulatory aid None/bed rest/wheelchair/nurse 0 Yes Crutches/cane/walker 0 No Furniture 0 No Intravenous therapy Access/Saline/Heparin Lock 0 No Gait/Transferring Normal/ bed rest/ wheelchair 0 Yes Weak (short steps with or without shuffle, stooped but able to lift head while walking, may  0 No seek support from furniture) Impaired (short steps with shuffle, may have difficulty arising from chair, head down, impaired 0 No balance) Mental Status Oriented to own ability 0 Yes Electronic Signature(s) Signed: 11/27/2020 12:18:09 PM By: Hansel Feinstein Entered By: Hansel Feinstein on 11/27/2020 10:48:42 Kenneth Cohen ([id number]) -------------------------------------------------------------------------------- Foot Assessment Details Patient Name: Kenneth Cohen Date of Service: 11/27/2020 10:15 AM Medical Record Number: [id number] Patient Account Number: [id number] Date of Birth/Sex: 1965-10-22 (55 y.o. M) Treating RN: Hansel Feinstein Primary Care Careem Yasui: Vernona Rieger Other Clinician: Referring Athanasios Heldman: Vernona Rieger Treating Madix Blowe/Extender: Tilda Franco in Treatment: 0 Foot Assessment Items Site Locations + = Sensation present, - = Sensation absent, C = Callus, U = Ulcer R = Redness, W = Warmth, M = Maceration, PU = Pre-ulcerative lesion F = Fissure, S = Swelling, D = Dryness Assessment Right: Left: Other Deformity: No No Prior Foot Ulcer: No No Prior Amputation: No Yes Charcot Joint: No No Ambulatory Status: Ambulatory With  Help Assistance Device: Wheelchair Gait: Academic librarian Signature(s) Signed: 11/27/2020 12:18:09 PM By: Hansel Feinstein Entered By: Hansel Feinstein on 11/27/2020 10:48:10 Kenneth Cohen ([id number]) -------------------------------------------------------------------------------- Nutrition Risk Screening Details Patient Name: Kenneth Cohen Date of Service: 11/27/2020 10:15 AM Medical Record Number: [id number] Patient Account Number: [id number] Date of Birth/Sex: 11/06/1965 (55 y.o. M) Treating RN: Hansel Feinstein Primary Care Jeremias Broyhill: Vernona Rieger Other Clinician: Referring Kaulana Brindle: Vernona Rieger Treating Ammiel Guiney/Extender: Tilda Franco in Treatment: 0 Height (in): 78 Weight (lbs): 341 Body Mass Index (BMI): 39.4 Nutrition Risk Screening Items Score Screening NUTRITION RISK SCREEN: I have an illness or condition that made me change the kind and/or amount of food I eat 0 No I eat fewer than two meals per day 0 No I eat few fruits and vegetables, or milk products 0 No I have three or more drinks of beer, liquor or wine almost every day 0 No I have tooth or mouth problems that make it hard for me to eat 0 No I don't always have enough money to buy the food I need 0 No I eat alone most of the time 0 No I take three or more different prescribed or over-the-counter drugs a day 1 Yes Without wanting to, I have lost or gained 10 pounds in the last six months 0 No I am not always physically able to shop, cook and/or feed myself 0 No Nutrition Protocols Good Risk Protocol Moderate Risk Protocol 0 Provide education on nutrition High Risk Proctocol Risk Level: Good Risk Score: 1 Electronic Signature(s) Signed: 11/27/2020 12:18:09 PM By: Hansel Feinstein Entered ByHansel Feinstein on 11/27/2020 10:44:08

## 2020-11-28 NOTE — Progress Notes (Signed)
CYLUS, DOUVILLE ([id number]) Visit Report for 11/27/2020 Allergy List Details Patient Name: Kenneth Cohen, Kenneth Cohen Date of Service: 11/27/2020 10:15 AM Medical Record Number: [id number] Patient Account Number: [id number] Date of Birth/Sex: 01/27/1965 (55 y.o. M) Treating RN: Donnamarie Poag Primary Care Amarisa Wilinski: Alma Friendly Other Clinician: Referring Jniyah Dantuono: Alma Friendly Treating Daeton Kluth/Extender: Yaakov Guthrie in Treatment: 0 Allergies Active Allergies iodine Severity: Moderate cefaclor Allergy Notes Electronic Signature(s) Signed: 11/27/2020 12:18:09 PM By: Donnamarie Poag Entered By: Donnamarie Poag on 11/27/2020 10:42:44 Kenneth Cohen ([id number]) -------------------------------------------------------------------------------- Arrival Information Details Patient Name: Kenneth Cohen Date of Service: 11/27/2020 10:15 AM Medical Record Number: [id number] Patient Account Number: [id number] Date of Birth/Sex: 1965-03-07 (55 y.o. M) Treating RN: Donnamarie Poag Primary Care Amarri Satterly: Alma Friendly Other Clinician: Referring Ariyon Gerstenberger: Alma Friendly Treating Sheza Strickland/Extender: Yaakov Guthrie in Treatment: 0 Visit Information Patient Arrived: Wheel Chair Arrival Time: 10:35 Accompanied By: son Transfer Assistance: EasyPivot Patient Lift Patient Has Alerts: Yes Patient Alerts: DIABETIC Electronic Signature(s) Signed: 11/27/2020 12:18:09 PM By: Donnamarie Poag Entered By: Donnamarie Poag on 11/27/2020 10:40:31 Kenneth Cohen ([id number]) -------------------------------------------------------------------------------- Clinic Level of Care Assessment Details Patient Name: Kenneth Cohen Date of Service: 11/27/2020 10:15 AM Medical Record Number: [id number] Patient Account Number: [id number] Date of Birth/Sex: 24-Dec-1965 (55 y.o. M) Treating RN: Donnamarie Poag Primary Care Aurilla Coulibaly: Alma Friendly Other Clinician: Referring Erubiel Manasco: Alma Friendly Treating Baily Serpe/Extender:  Yaakov Guthrie in Treatment: 0 Clinic Level of Care Assessment Items TOOL 2 Quantity Score '[]'  - Use when only an EandM is performed on the INITIAL visit 0 ASSESSMENTS - Nursing Assessment / Reassessment '[]'  - General Physical Exam (combine w/ comprehensive assessment (listed just below) when performed on new 0 pt. evals) '[]'  - 0 Comprehensive Assessment (HX, ROS, Risk Assessments, Wounds Hx, etc.) ASSESSMENTS - Wound and Skin Assessment / Reassessment X - Simple Wound Assessment / Reassessment - one wound 1 5 '[]'  - 0 Complex Wound Assessment / Reassessment - multiple wounds '[]'  - 0 Dermatologic / Skin Assessment (not related to wound area) ASSESSMENTS - Ostomy and/or Continence Assessment and Care '[]'  - Incontinence Assessment and Management 0 '[]'  - 0 Ostomy Care Assessment and Management (repouching, etc.) PROCESS - Coordination of Care X - Simple Patient / Family Education for ongoing care 1 15 '[]'  - 0 Complex (extensive) Patient / Family Education for ongoing care X- 1 10 Staff obtains Programmer, systems, Records, Test Results / Process Orders '[]'  - 0 Staff telephones HHA, Nursing Homes / Clarify orders / etc '[]'  - 0 Routine Transfer to another Facility (non-emergent condition) '[]'  - 0 Routine Hospital Admission (non-emergent condition) X- 1 15 New Admissions / Biomedical engineer / Ordering NPWT, Apligraf, etc. '[]'  - 0 Emergency Hospital Admission (emergent condition) X- 1 10 Simple Discharge Coordination '[]'  - 0 Complex (extensive) Discharge Coordination PROCESS - Special Needs '[]'  - Pediatric / Minor Patient Management 0 '[]'  - 0 Isolation Patient Management '[]'  - 0 Hearing / Language / Visual special needs '[]'  - 0 Assessment of Community assistance (transportation, D/C planning, etc.) '[]'  - 0 Additional assistance / Altered mentation '[]'  - 0 Support Surface(s) Assessment (bed, cushion, seat, etc.) INTERVENTIONS - Wound Cleansing / Measurement X - Wound Imaging  (photographs - any number of wounds) 1 5 '[]'  - 0 Wound Tracing (instead of photographs) X- 1 5 Simple Wound Measurement - one wound '[]'  - 0 Complex Wound Measurement - multiple wounds Kenneth Cohen, Kenneth Cohen ([id number]) X- 1 5 Simple Wound Cleansing - one wound '[]'  - 0 Complex Wound Cleansing - multiple wounds INTERVENTIONS - Wound Dressings '[]'  -  Small Wound Dressing one or multiple wounds 0 X- 1 15 Medium Wound Dressing one or multiple wounds '[]'  - 0 Large Wound Dressing one or multiple wounds '[]'  - 0 Application of Medications - injection INTERVENTIONS - Miscellaneous '[]'  - External ear exam 0 '[]'  - 0 Specimen Collection (cultures, biopsies, blood, body fluids, etc.) '[]'  - 0 Specimen(s) / Culture(s) sent or taken to Lab for analysis '[]'  - 0 Patient Transfer (multiple staff / Civil Service fast streamer / Similar devices) '[]'  - 0 Simple Staple / Suture removal (25 or less) '[]'  - 0 Complex Staple / Suture removal (26 or more) '[]'  - 0 Hypo / Hyperglycemic Management (close monitor of Blood Glucose) X- 1 15 Ankle / Brachial Index (ABI) - do not check if billed separately Has the patient been seen at the hospital within the last three years: Yes Total Score: 100 Level Of Care: New/Established - Level 3 Electronic Signature(s) Signed: 11/27/2020 12:18:09 PM By: Donnamarie Poag Entered By: Donnamarie Poag on 11/27/2020 11:45:10 Kenneth Cohen ([id number]) -------------------------------------------------------------------------------- Encounter Discharge Information Details Patient Name: Kenneth Cohen Date of Service: 11/27/2020 10:15 AM Medical Record Number: [id number] Patient Account Number: [id number] Date of Birth/Sex: 09-13-1965 (55 y.o. M) Treating RN: Donnamarie Poag Primary Care Nikayla Madaris: Alma Friendly Other Clinician: Referring Zhara Gieske: Alma Friendly Treating Giovannina Mun/Extender: Yaakov Guthrie in Treatment: 0 Encounter Discharge Information Items Post Procedure Vitals Discharge Condition:  Stable Temperature (F): 98.4 Ambulatory Status: Wheelchair Pulse (bpm): 96 Discharge Destination: Home Respiratory Rate (breaths/min): 16 Transportation: Private Auto Blood Pressure (mmHg): 112/73 Accompanied By: son Schedule Follow-up Appointment: Yes Clinical Summary of Care: Electronic Signature(s) Signed: 11/27/2020 12:18:09 PM By: Donnamarie Poag Entered By: Donnamarie Poag on 11/27/2020 11:47:01 Kenneth Cohen ([id number]) -------------------------------------------------------------------------------- Lower Extremity Assessment Details Patient Name: Kenneth Cohen Date of Service: 11/27/2020 10:15 AM Medical Record Number: [id number] Patient Account Number: [id number] Date of Birth/Sex: [birth year] (55 y.o. M) Treating RN: Donnamarie Poag Primary Care Rhett Mutschler: Alma Friendly Other Clinician: Referring Zane Samson: Alma Friendly Treating Kenry Daubert/Extender: Yaakov Guthrie in Treatment: 0 Edema Assessment Assessed: [Left: No] [Right: Yes] Edema: [Left: Ye] [Right: s] Calf Left: Right: Point of Measurement: 36 cm From Medial Instep 47.5 cm Ankle Left: Right: Point of Measurement: 13 cm From Medial Instep 31.5 cm Knee To Floor Left: Right: From Medial Instep 46 cm Vascular Assessment Pulses: Dorsalis Pedis Palpable: [Right:No] Blood Pressure: Brachial: [Right:110] Ankle: [Right:Dorsalis Pedis: 128 1.16] Electronic Signature(s) Signed: 11/27/2020 12:18:09 PM By: Donnamarie Poag Entered By: Donnamarie Poag on 11/27/2020 11:08:22 Kenneth Cohen ([id number]) -------------------------------------------------------------------------------- Multi Wound Chart Details Patient Name: Kenneth Cohen Date of Service: 11/27/2020 10:15 AM Medical Record Number: [id number] Patient Account Number: [id number] Date of Birth/Sex: 21-Oct-1965 (56 y.o. 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 Comorbid History: Lymphedema, Sleep Apnea, N/A N/A Coronary Artery Disease, Hypertension, Type II Diabetes, Osteoarthritis, Neuropathy Date Acquired: 10/29/2020 N/A N/A Weeks of Treatment: 0 N/A N/A Wound Status: Open N/A N/A Measurements L x W x D (cm) 2.6x1.4x1.3 N/A N/A Area (cm) : 2.859 N/A N/A Volume (cm) : 3.717 N/A N/A Classification: Grade 2 N/A N/A Exudate Amount: Large N/A N/A Exudate Type: Serosanguineous N/A N/A Exudate Color: red, brown  N/A N/A Granulation Amount: Medium (34-66%) N/A N/A Granulation Quality: Red, Pink, Hyper-granulation N/A N/A Necrotic Amount: Medium (34-66%) N/A N/A Exposed Structures: Fat Layer (Subcutaneous Tissue): N/A N/A Yes Fascia: No Tendon: No Muscle: No Joint: No Bone: No Debridement: Chemical/Enzymatic/Mechanical N/A N/A Pre-procedure Verification/Time 11:18 N/A N/A Out Taken: Pain Control: Lidocaine N/A N/A Instrument: Other(saline/gauze) N/A N/A Bleeding: Moderate N/A N/A Hemostasis Achieved: Pressure N/A N/A Debridement Treatment Procedure was tolerated well N/A N/A Response: Post Debridement 2.6x1.4x1.3 N/A N/A Measurements L x W x D (cm) Post Debridement Volume: 3.717 N/A N/A (cm) Procedures Performed: Debridement N/A N/A Kenneth Cohen, Kenneth Cohen ([id number]) Treatment Notes Electronic Signature(s) Signed: 11/27/2020 4:50:13 PM By: Kalman Shan DO Entered By: Kalman Shan on 11/27/2020 11:29:41 Kenneth Cohen  ([id number]) -------------------------------------------------------------------------------- Multi-Disciplinary Care Plan Details Patient Name: Kenneth Cohen Date of Service: 11/27/2020 10:15 AM Medical Record Number: [id number] Patient Account Number: [id number] Date of Birth/Sex: [birth year] (55 y.o. M) Treating RN: Donnamarie Poag Primary Care Meklit Cotta: Alma Friendly Other Clinician: Referring Dontrell Stuck: Alma Friendly Treating Hamid Brookens/Extender: Yaakov Guthrie in Treatment: 0 Active Inactive Necrotic Tissue Nursing Diagnoses: Impaired tissue integrity related to necrotic/devitalized tissue Knowledge deficit related to management of necrotic/devitalized tissue Goals: Necrotic/devitalized tissue will be minimized in the wound bed Date Initiated: 11/27/2020 Target Resolution Date: 12/18/2020 Goal Status: Active Patient/caregiver will verbalize understanding of reason and process for debridement of necrotic tissue Date Initiated: 11/27/2020 Target Resolution Date: 12/18/2020 Goal Status: Active Interventions: Assess patient pain level pre-, during and post procedure and prior to discharge Provide education on necrotic tissue and debridement process Notes: Orientation to the Wound Care Program Nursing Diagnoses: Knowledge deficit related to the wound healing center program Goals: Patient/caregiver will verbalize understanding of the [place] Date Initiated: 11/27/2020 Target Resolution Date: 12/18/2020 Goal Status: Active Interventions: Provide education on orientation to the wound center Notes: Wound/Skin Impairment Nursing Diagnoses: Impaired tissue integrity Knowledge deficit related to smoking impact on wound healing Knowledge deficit related to ulceration/compromised skin integrity Goals: Patient/caregiver will verbalize understanding of skin care regimen Date Initiated: 11/27/2020 Target Resolution Date: 12/18/2020 Goal Status:  Active Ulcer/skin breakdown will have a volume reduction of 30% by week 4 Date Initiated: 11/27/2020 Target Resolution Date: 12/26/2020 Goal Status: Active Ulcer/skin breakdown will have a volume reduction of 50% by week 8 Date Initiated: 11/27/2020 Target Resolution Date: 01/26/2021 Goal Status: Active Ulcer/skin breakdown will have a volume reduction of 80% by week 12 Kenneth Cohen, Kenneth Cohen ([id number]) Date Initiated: 11/27/2020 Target Resolution Date: 02/26/2021 Goal Status: Active Ulcer/skin breakdown will heal within 14 weeks Date Initiated: 11/27/2020 Target Resolution Date: 03/04/2021 Goal Status: Active Interventions: Assess patient/caregiver ability to obtain necessary supplies Assess patient/caregiver ability to perform ulcer/skin care regimen upon admission and as needed Assess ulceration(s) every visit Notes: Electronic Signature(s) Signed: 11/27/2020 12:18:09 PM By: Donnamarie Poag Entered By: Donnamarie Poag on 11/27/2020 11:16:55 Kenneth Cohen ([id number]) -------------------------------------------------------------------------------- Pain Assessment Details Patient Name: Kenneth Cohen Date of Service: 11/27/2020 10:15 AM Medical Record Number: [id number] Patient Account Number: [id number] Date of Birth/Sex: 22-Oct-1965 (55 y.o. M) Treating RN: Donnamarie Poag Primary Care Itzamar Traynor: Alma Friendly Other Clinician: Referring Annais Crafts: Alma Friendly Treating Lashawnda Hancox/Extender: Yaakov Guthrie in Treatment: 0 Active Problems Location of Pain Severity and Description of Pain Patient Has Paino Yes Site Locations Pain Location: Pain in Ulcers Rate the pain. Current Pain Level: 2 Pain Management and Medication Current Pain Management: Electronic Signature(s) Signed: 11/27/2020 12:18:09 PM By: Donnamarie Poag Entered By: Donnamarie Poag on 11/27/2020 10:40:42 Kenneth Cohen ([id number]) -------------------------------------------------------------------------------- Patient/Caregiver  Education  Details Patient Name: Kenneth Cohen, Kenneth Cohen Date of Service: 11/27/2020 10:15 AM Medical Record Number: [id number] Patient Account Number: [id number] Date of Birth/Gender: Jul 09, 1965 (55 y.o. M) Treating RN: Donnamarie Poag Primary Care Physician: Alma Friendly Other Clinician: Referring Physician: Alma Friendly Treating Physician/Extender: Yaakov Guthrie in Treatment: 0 Education Assessment Education Provided To: Patient Education Topics Provided Basic Hygiene: Infection: Nutrition: Offloading: Welcome [facility] [place]: Wound Debridement: Wound/Skin Impairment: Electronic Signature(s) Signed: 11/27/2020 12:18:09 PM By: Donnamarie Poag Entered By: Donnamarie Poag on 11/27/2020 Kenneth Cohen, Kenneth Cohen ([id number]) -------------------------------------------------------------------------------- Wound Assessment Details Patient Name: Kenneth Cohen Date of Service: 11/27/2020 10:15 AM Medical Record Number: [id number] Patient Account Number: [id number] Date of Birth/Sex: 1965-11-02 (55 y.o. M) Treating RN: Donnamarie Poag Primary Care Nicco Reaume: Alma Friendly Other Clinician: Referring Marguita Venning: Alma Friendly Treating Lorielle Boehning/Extender: Yaakov Guthrie in Treatment: 0 Wound Status Wound Number: 1 Primary Diabetic Wound/Ulcer of the Lower Extremity Etiology: Wound Location: Right, Lateral, Dorsal Foot Wound Open Wounding Event: Gradually Appeared Status: Date Acquired: 10/29/2020 Comorbid Lymphedema, Sleep Apnea, Coronary Artery Disease, Weeks Of Treatment: 0 History: Hypertension, Type II Diabetes, Osteoarthritis, Clustered Wound: No Neuropathy Photos Wound Measurements Length: (cm) 2.6 Width: (cm) 1.4 Depth: (cm) 1.3 Area: (cm) 2.859 Volume: (cm) 3.717 % Reduction in Area: % Reduction in Volume: Tunneling: No Undermining: No Wound Description Classification: Grade 2 Exudate Amount: Large Exudate Type: Serosanguineous Exudate Color: red,  brown Foul Odor After Cleansing: No Slough/Fibrino Yes Wound Bed Granulation Amount: Medium (34-66%) Exposed Structure Granulation Quality: Red, Pink, Hyper-granulation Fascia Exposed: No Necrotic Amount: Medium (34-66%) Fat Layer (Subcutaneous Tissue) Exposed: Yes Necrotic Quality: Adherent Slough Tendon Exposed: No Muscle Exposed: No Joint Exposed: No Bone Exposed: No Treatment Notes Wound #1 (Foot) Wound Laterality: Dorsal, Right, Lateral Cleanser Kenneth Cohen Kenneth Cohen Discharge Instruction: Use supplies as instructed; Kit contains: (15) Saline Bullets; (15) 3x3 Gauze; 15 pr Gloves Soap and Water Kenneth Cohen, Kenneth Cohen ([id number]) Discharge Instruction: Gently cleanse wound with antibacterial soap, rinse and pat dry prior to dressing wounds Wound Cleanser Discharge Instruction: Wash your hands with soap and water. Remove old dressing, discard into plastic bag and place into trash. Cleanse the wound with Wound Cleanser prior to applying a clean dressing using gauze sponges, not tissues or cotton balls. Do not scrub or use excessive force. Pat dry using gauze sponges, not tissue or cotton balls. Peri-Wound Care Topical Primary Dressing Silvercel Small 2x2 (in/in) Discharge Instruction: Apply Silvercel Small 2x2 (in/in) as instructed Secondary Dressing Zetuvit Plus Silicone Border Dressing 4x4 (in/in) Discharge Instruction: to absorb drainage on top Secured With 63M Medipore H Soft Cloth Surgical Tape, 2x2 (in/yd) Kerlix Roll Sterile or Non-Sterile 6-ply 4.5x4 (yd/yd) Discharge Instruction: Apply Kerlix as directed Compression Wrap Compression Stockings Add-Ons Electronic Signature(s) Signed: 11/27/2020 12:18:09 PM By: Donnamarie Poag Entered By: Donnamarie Poag on 11/27/2020 11:02:08 Kenneth Cohen ([id number]) -------------------------------------------------------------------------------- Vitals Details Patient Name: Kenneth Cohen Date of Service: 11/27/2020 10:15  AM Medical Record Number: [id number] Patient Account Number: [id number] Date of Birth/Sex: [birth year] (55 y.o. M) Treating RN: Donnamarie Poag Primary Care Morningstar Toft: Alma Friendly Other Clinician: Referring Lanaya Bennis: Alma Friendly Treating Kimberlin Scheel/Extender: Yaakov Guthrie in Treatment: 0 Vital Signs Time Taken: 10:38 Temperature (F): 98.4 Height (in): 78 Pulse (bpm): 96 Source: Stated Respiratory Rate (breaths/min): 16 Weight (lbs): 341 Blood Pressure (mmHg): 112/73 Source: Stated Reference Range: 80 - 120 mg / dl Body Mass Index (BMI): 39.4 Notes unsafe with AKA and foot wound, weight from MD appt last stated Electronic Signature(s) Signed: 11/27/2020 12:18:09  PM By: Bishop, Joy Entered ByDonnamarie Poag on 11/27/2020 10:41:48

## 2020-11-28 NOTE — Progress Notes (Signed)
ARSEN, MANGIONE ([id number]) Visit Report for 11/27/2020 Chief Complaint Document Details Patient Name: Kenneth Cohen, Kenneth Cohen Date of Service: 11/27/2020 10:15 AM Medical Record Number: [id number] Patient Account Number: [id number] Date of Birth/Sex: Apr 20, 1965 (55 y.o. M) Treating RN: Donnamarie Poag Primary Care Provider: Alma Friendly Other Clinician: Referring Provider: Alma Friendly Treating Provider/Extender: Yaakov Guthrie in Treatment: 0 Information Obtained from: Patient Chief Complaint Right foot wound Electronic Signature(s) Signed: 11/27/2020 4:50:13 PM By: Kalman Shan DO Entered By: Kalman Shan on 11/27/2020 11:30:05 Kenneth Cohen ([id number]) -------------------------------------------------------------------------------- Debridement Details Patient Name: Kenneth Cohen Date of Service: 11/27/2020 10:15 AM Medical Record Number: [id number] Patient Account Number: [id number] Date of Birth/Sex: 08-11-1965 (55 y.o. M) Treating RN: Donnamarie Poag Primary Care Provider: Alma Friendly Other Clinician: Referring Provider: Alma Friendly Treating Provider/Extender: Yaakov Guthrie in Treatment: 0 Debridement Performed for Wound #1 Right,Lateral,Dorsal Foot Assessment: Performed By: Physician Kalman Shan, MD Debridement Type: Chemical/Enzymatic/Mechanical Agent Used: saline and gauze Severity of Tissue Pre Debridement: Fat layer exposed Level of Consciousness (Pre- Awake and Alert procedure): Pre-procedure Verification/Time Out Yes - 11:18 Taken: Start Time: 11:18 Pain Control: Lidocaine Instrument: Other : saline/gauze Bleeding: Moderate Hemostasis Achieved: Pressure Response to Treatment: Procedure was tolerated well Level of Consciousness (Post- Awake and Alert procedure): Post Debridement Measurements of Total Wound Length: (cm) 2.6 Width: (cm) 1.4 Depth: (cm) 1.3 Volume: (cm) 3.717 Character of Wound/Ulcer Post Debridement:  Improved Severity of Tissue Post Debridement: Fat layer exposed Post Procedure Diagnosis Same as Pre-procedure Electronic Signature(s) Signed: 11/27/2020 12:18:09 PM By: Donnamarie Poag Signed: 11/27/2020 4:50:13 PM By: Kalman Shan DO Entered By: Donnamarie Poag on 11/27/2020 11:21:05 Kenneth Cohen ([id number]) -------------------------------------------------------------------------------- HPI Details Patient Name: Kenneth Cohen Date of Service: 11/27/2020 10:15 AM Medical Record Number: [id number] Patient Account Number: [id number] Date of Birth/Sex: 09/05/1965 (55 y.o. M) Treating RN: Donnamarie Poag Primary Care Provider: Alma Friendly Other Clinician: Referring Provider: Alma Friendly Treating Provider/Extender: Yaakov Guthrie in Treatment: 0 History of Present Illness HPI Description: Admission 11/27/2020 Mr. Kenneth Cohen is a 55 year old male with a past medical history of insulin-dependent previously uncontrolled type 2 diabetes with a current hemoglobin A1c of 6.8, essential hypertension, status post AKA on the left from complications of Charcot's foot that presents to the clinic for a 1-2 month history of right foot wound. He has been using Medihoney to the wound bed Twice daily. He has also been doing Epson salt soaks 30 minutes daily. He visited the ED on 10/25/2020 and started on Bactrim and Avelox for wound infection. His foot has progressively become more swollen over the past few weeks. He has heavy drainage. He denies purulent drainage. He currently denies pain to the foot. He walked in today only wearing a sock. Electronic Signature(s) Signed: 11/27/2020 4:50:13 PM By: Kalman Shan DO Entered By: Kalman Shan on 11/27/2020 11:31:58 Kenneth Cohen ([id number]) -------------------------------------------------------------------------------- Physical Exam Details Patient Name: Kenneth Cohen Date of Service: 11/27/2020 10:15 AM Medical Record Number:  [id number] Patient Account Number: [id number] Date of Birth/Sex: 08/03/1965 (55 y.o. M) Treating RN: Donnamarie Poag Primary Care Provider: Alma Friendly Other Clinician: Referring Provider: Alma Friendly Treating Provider/Extender: Yaakov Guthrie in Treatment: 0 Constitutional . Cardiovascular . Psychiatric . Notes Right foot: To the lateral aspect there are 2 open wounds with significant serosanguineous drainage upon palpation. There are some thickened yellow drainage as well. The foot is warm and erythematous. The wounds are small but probe at least 5 cm in depth. This overlies bone. Electronic Signature(s) Signed: 11/27/2020 4:50:13 PM By: Kalman Shan DO  Entered By: Kalman Shan on 11/27/2020 11:33:56 Kenneth Cohen ([id number]) -------------------------------------------------------------------------------- Physician Orders Details Patient Name: Kenneth Cohen Date of Service: 11/27/2020 10:15 AM Medical Record Number: [id number] Patient Account Number: [id number] Date of Birth/Sex: [birth year] (55 y.o. M) Treating RN: Donnamarie Poag Primary Care Provider: Alma Friendly Other Clinician: Referring Provider: Alma Friendly Treating Provider/Extender: Yaakov Guthrie in Treatment: 0 Verbal / Phone Orders: No Diagnosis Coding ICD-10 Coding Code Description E11.621 Type 2 diabetes mellitus with foot ulcer L97.512 Non-pressure chronic ulcer of other part of right foot with fat layer exposed I10 Essential (primary) hypertension Z89.512 Acquired absence of left leg below knee Follow-up Appointments o Return Appointment in 1 week. o Nurse Visit as needed [employer] o Wash wounds with antibacterial soap and water. - keep dressing dry o No tub bath. Edema Control - Lymphedema / Segmental Compressive Device / Other o Elevate leg(s) parallel to the floor when sitting. o DO YOUR BEST to sleep in the bed at night. DO NOT sleep in your  recliner. Long hours of sitting in a recliner leads to swelling of the legs and/or potential wounds on your backside. Additional Orders / Instructions o Follow Nutritious Diet and Increase Protein Intake - eat protein and continue to monitor blood sugar o Other: - stay off feet as much as possible and elevate legs Medications-Please add to medication list. o P.O. Antibiotics - Pick up and start antibiotics today and take as directed Wound Treatment Wound #1 - Foot Wound Laterality: Dorsal, Right, Lateral Cleanser: Byram Ancillary Kit - 15 Day Supply (DME) (Generic) 2 x Per Day/15 Days Discharge Instructions: Use supplies as instructed; Kit contains: (15) Saline Bullets; (15) 3x3 Gauze; 15 pr Gloves Cleanser: Soap and Water 2 x Per Day/15 Days Discharge Instructions: Gently cleanse wound with antibacterial soap, rinse and pat dry prior to dressing wounds Cleanser: Wound Cleanser (DME) (Generic) 2 x Per Day/15 Days Discharge Instructions: Wash your hands with soap and water. Remove old dressing, discard into plastic bag and place into trash. Cleanse the wound with Wound Cleanser prior to applying a clean dressing using gauze sponges, not tissues or cotton balls. Do not scrub or use excessive force. Pat dry using gauze sponges, not tissue or cotton balls. Primary Dressing: Silvercel Small 2x2 (in/in) (DME) (Generic) 2 x Per Day/15 Days Discharge Instructions: Apply Silvercel Small 2x2 (in/in) as instructed Secondary Dressing: Zetuvit Plus Silicone Border Dressing 4x4 (in/in) (DME) (Generic) 2 x Per Day/15 Days Discharge Instructions: to absorb drainage on top Secured With: 79M Medipore H Soft Cloth Surgical Tape, 2x2 (in/yd) (DME) (Generic) 2 x Per Day/15 Days Secured With: Kerlix Roll Sterile or Non-Sterile 6-ply 4.5x4 (yd/yd) (DME) (Generic) 2 x Per OIN/86 Days Discharge Instructions: Apply Kerlix as directed Radiology MAIKA, KACZMAREK ([id number]) o MRI, lower extremity without contrast  - Right foot-they will call you when it's scheduled - (ICD10 L97.512 - Non-pressure chronic ulcer of other part of right foot with fat layer exposed) Patient Medications Allergies: iodine, cefaclor Notifications Medication Indication Start End clindamycin HCl 11/27/2020 DOSE 1 - oral 300 mg capsule - 1 capsule oral q6h x 10 days Electronic Signature(s) Signed: 11/27/2020 11:50:45 AM By: Kalman Shan DO Entered By: Kalman Shan on 11/27/2020 11:50:44 Kenneth Cohen ([id number]) -------------------------------------------------------------------------------- Problem List Details Patient Name: Kenneth Cohen Date of Service: 11/27/2020 10:15 AM Medical Record Number: [id number] Patient Account Number: [id number] Date of Birth/Sex: [birth year] (55 y.o. M) Treating RN: Donnamarie Poag Primary Care Provider: Alma Friendly Other Clinician: Referring Provider: Alma Friendly Treating Provider/Extender:  Kalman Shan Weeks in Treatment: 0 Active Problems ICD-10 Encounter Code Description Active Date MDM Diagnosis E11.621 Type 2 diabetes mellitus with foot ulcer 11/27/2020 No Yes L97.512 Non-pressure chronic ulcer of other part of right foot with fat layer 11/27/2020 No Yes exposed [place] (primary) hypertension 11/27/2020 No Yes Z89.512 Acquired absence of left leg below knee 11/27/2020 No Yes L03.115 Cellulitis of right lower limb 11/27/2020 No Yes Inactive Problems Resolved Problems Electronic Signature(s) Signed: 11/27/2020 4:50:13 PM By: Kalman Shan DO Entered By: Kalman Shan on 11/27/2020 11:29:15 Kenneth Cohen ([id number]) -------------------------------------------------------------------------------- Progress Note Details Patient Name: Kenneth Cohen Date of Service: 11/27/2020 10:15 AM Medical Record Number: [id number] Patient Account Number: [id number] Date of Birth/Sex: March 21, 1965 (55 y.o. M) Treating RN: Donnamarie Poag Primary Care Provider: Alma Friendly  Other Clinician: Referring Provider: Alma Friendly Treating Provider/Extender: Yaakov Guthrie in Treatment: 0 Subjective Chief Complaint Information obtained from Patient Right foot wound History of Present Illness (HPI) Admission 11/27/2020 Mr. Kenneth Cohen is a 55 year old male with a past medical history of insulin-dependent previously uncontrolled type 2 diabetes with a current hemoglobin A1c of 6.8, essential hypertension, status post AKA on the left from complications of Charcot's foot that presents to the clinic for a 1-2 month history of right foot wound. He has been using Medihoney to the wound bed Twice daily. He has also been doing Epson salt soaks 30 minutes daily. He visited the ED on 10/25/2020 and started on Bactrim and Avelox for wound infection. His foot has progressively become more swollen over the past few weeks. He has heavy drainage. He denies purulent drainage. He currently denies pain to the foot. He walked in today only wearing a sock. Patient History Information obtained from Patient. Allergies iodine (Severity: Moderate), cefaclor Social History Former smoker - ended on 01/20/2004, Marital Status - Married, Alcohol Use - Never, Drug Use - No History, Caffeine Use - Moderate. Medical History Hematologic/Lymphatic Patient has history of Lymphedema Respiratory Patient has history of Sleep Apnea Cardiovascular Patient has history of Coronary Artery Disease, Hypertension Endocrine Patient has history of Type II Diabetes Musculoskeletal Patient has history of Osteoarthritis Neurologic Patient has history of Neuropathy Patient is treated with Insulin, Oral Agents. Blood sugar is tested. Blood sugar results noted at the following times: Breakfast - 154. Review of Systems (ROS) Constitutional Symptoms (General Health) Denies complaints or symptoms of Fatigue, Fever, Chills, Marked Weight Change. Eyes Denies complaints or symptoms of Dry Eyes, Vision  Changes, Glasses / Contacts. Ear/Nose/Mouth/Throat Denies complaints or symptoms of Difficult clearing ears, Sinusitis. Cardiovascular Complains or has symptoms of LE edema. Gastrointestinal Denies complaints or symptoms of Frequent diarrhea, Nausea, Vomiting. Genitourinary Denies complaints or symptoms of Kidney failure/ Dialysis, Incontinence/dribbling. Immunological Denies complaints or symptoms of Hives, Itching. Integumentary (Skin) Complains or has symptoms of Wounds - hx R plantar; hx L AKA, Breakdown, Swelling. Psychiatric Denies complaints or symptoms of Anxiety, Claustrophobia. Kenneth Cohen, Kenneth Cohen ([id number]) Objective Constitutional Vitals Time Taken: 10:38 AM, Height: 78 in, Source: Stated, Weight: 341 lbs, Source: Stated, BMI: 39.4, Temperature: 98.4 F, Pulse: 96 bpm, Respiratory Rate: 16 breaths/min, Blood Pressure: 112/73 mmHg. General Notes: unsafe with AKA and foot wound, weight from MD appt last stated General Notes: Right foot: To the lateral aspect there are 2 open wounds with significant serosanguineous drainage upon palpation. There are some thickened yellow drainage as well. The foot is warm and erythematous. The wounds are small but probe at least 5 cm in depth. This overlies bone. Integumentary (Hair, Skin) Wound #1 status  is Open. Original cause of wound was Gradually Appeared. The date acquired was: 10/29/2020. The wound is located on the Right,Lateral,Dorsal Foot. The wound measures 2.6cm length x 1.4cm width x 1.3cm depth; 2.859cm^2 area and 3.717cm^3 volume. There is Fat Layer (Subcutaneous Tissue) exposed. There is no tunneling or undermining noted. There is a large amount of serosanguineous drainage noted. There is medium (34-66%) red, pink, hyper - granulation within the wound bed. There is a medium (34-66%) amount of necrotic tissue within the wound bed including Adherent Slough. Assessment Active Problems ICD-10 Type 2 diabetes mellitus with foot  ulcer Non-pressure chronic ulcer of other part of right foot with fat layer exposed Essential (primary) hypertension Acquired absence of left leg below knee Cellulitis of right lower limb Patient presents with a 1 to 58-monthhistory of worsening diabetic foot wound. I recommended using silver alginate and an absorbent pad such as zetuvit or Xtrasorb as a secondary dressing. I would also like to obtain an MRI due to the chronicity of the wound and the significant depth present. He states he is allergic to the contrast dye. We will do this without contrast. The foot appears infected and I will start clindamycin. Follow-up in 1 week. I did recommend he use a surgical shoe. Procedures Wound #1 Pre-procedure diagnosis of Wound #1 is a Diabetic Wound/Ulcer of the Lower Extremity located on the Right,Lateral,Dorsal Foot .Severity of Tissue Pre Debridement is: Fat layer exposed. There was a Chemical/Enzymatic/Mechanical debridement performed by HKalman Shan MD. With the following instrument(s): saline/gauze after achieving pain control using Lidocaine. Other agent used was saline and gauze. A time out was conducted at 11:18, prior to the start of the procedure. A Moderate amount of bleeding was controlled with Pressure. The procedure was tolerated well. Post Debridement Measurements: 2.6cm length x 1.4cm width x 1.3cm depth; 3.717cm^3 volume. Character of Wound/Ulcer Post Debridement is improved. Severity of Tissue Post Debridement is: Fat layer exposed. Post procedure Diagnosis Wound #1: Same as Pre-Procedure Plan Follow-up Appointments: Return Appointment in 1 week. Nurse Visit as needed Bathing/ Shower/ Hygiene: Wash wounds with antibacterial soap and water. - keep dressing dry No tub bath. Edema Control - Lymphedema / Segmental Compressive Device / Other: Elevate leg(s) parallel to the floor when sitting. Kenneth Cohen, Kenneth Cohen([id number] DO YOUR BEST to sleep in the bed at night. DO NOT sleep  in your recliner. Long hours of sitting in a recliner leads to swelling of the legs and/or potential wounds on your backside. Additional Orders / Instructions: Follow Nutritious Diet and Increase Protein Intake - eat protein and continue to monitor blood sugar Other: - stay off feet as much as possible and elevate legs Medications-Please add to medication list.: P.O. Antibiotics - Pick up and start antibiotics today and take as directed Radiology ordered were: MRI, lower extremity without contrast - Right foot-they will call you when it's scheduled The following medication(s) was prescribed: clindamycin HCl oral 300 mg capsule 1 1 capsule oral q6h x 10 days starting 11/27/2020 WOUND #1: - Foot Wound Laterality: Dorsal, Right, Lateral Cleanser: Byram Ancillary Kit - 15 Day Supply (DME) (Generic) 2 x Per Day/15 Days Discharge Instructions: Use supplies as instructed; Kit contains: (15) Saline Bullets; (15) 3x3 Gauze; 15 pr Gloves Cleanser: Soap and Water 2 x Per Day/15 Days Discharge Instructions: Gently cleanse wound with antibacterial soap, rinse and pat dry prior to dressing wounds Cleanser: Wound Cleanser (DME) (Generic) 2 x Per Day/15 Days Discharge Instructions: Wash your hands with soap and water. Remove  old dressing, discard into plastic bag and place into trash. Cleanse the wound with Wound Cleanser prior to applying a clean dressing using gauze sponges, not tissues or cotton balls. Do not scrub or use excessive force. Pat dry using gauze sponges, not tissue or cotton balls. Primary Dressing: Silvercel Small 2x2 (in/in) (DME) (Generic) 2 x Per Day/15 Days Discharge Instructions: Apply Silvercel Small 2x2 (in/in) as instructed Secondary Dressing: Zetuvit Plus Silicone Border Dressing 4x4 (in/in) (DME) (Generic) 2 x Per Day/15 Days Discharge Instructions: to absorb drainage on top Secured With: 36M Medipore H Soft Cloth Surgical Tape, 2x2 (in/yd) (DME) (Generic) 2 x Per Day/15  Days Secured With: Kerlix Roll Sterile or Non-Sterile 6-ply 4.5x4 (yd/yd) (DME) (Generic) 2 x Per Day/15 Days Discharge Instructions: Apply Kerlix as directed 1. Silver alginate and secondary absorbent dressing 2. Surgical shoe 3. Clindamycin 4. Follow-up in 1 week Electronic Signature(s) Signed: 11/27/2020 4:50:13 PM By: Kalman Shan DO Entered By: Kalman Shan on 11/27/2020 16:49:58 Kenneth Cohen ([id number]) -------------------------------------------------------------------------------- ROS/PFSH Details Patient Name: Kenneth Cohen Date of Service: 11/27/2020 10:15 AM Medical Record Number: [id number] Patient Account Number: [id number] Date of Birth/Sex: Jun 24, 1965 (55 y.o. M) Treating RN: Donnamarie Poag Primary Care Provider: Alma Friendly Other Clinician: Referring Provider: Alma Friendly Treating Provider/Extender: Yaakov Guthrie in Treatment: 0 Information Obtained From Patient Constitutional Symptoms (General Health) Complaints and Symptoms: Negative for: Fatigue; Fever; Chills; Marked Weight Change Eyes Complaints and Symptoms: Negative for: Dry Eyes; Vision Changes; Glasses / Contacts Ear/Nose/Mouth/Throat Complaints and Symptoms: Negative for: Difficult clearing ears; Sinusitis Cardiovascular Complaints and Symptoms: Positive for: LE edema Medical History: Positive for: Coronary Artery Disease; Hypertension Gastrointestinal Complaints and Symptoms: Negative for: Frequent diarrhea; Nausea; Vomiting Genitourinary Complaints and Symptoms: Negative for: Kidney failure/ Dialysis; Incontinence/dribbling Immunological Complaints and Symptoms: Negative for: Hives; Itching Integumentary (Skin) Complaints and Symptoms: Positive for: Wounds - hx R plantar; hx L AKA; Breakdown; Swelling Psychiatric Complaints and Symptoms: Negative for: Anxiety; Claustrophobia Hematologic/Lymphatic Medical History: Positive for: Lymphedema Respiratory Kenneth Cohen,  Kenneth Cohen ([id number]) Medical History: Positive for: Sleep Apnea Endocrine Medical History: Positive for: Type II Diabetes Time with diabetes: 2016 Treated with: Insulin, Oral agents Blood sugar tested every day: Yes Tested : dexcom Blood sugar testing results: Breakfast: 154 Musculoskeletal Medical History: Positive for: Osteoarthritis Neurologic Medical History: Positive for: Neuropathy Oncologic Immunizations Pneumococcal Vaccine: Received Pneumococcal Vaccination: No Implantable Devices None Family and Social History Former smoker - ended on 01/20/2004; Marital Status - Married; Alcohol Use: Never; Drug Use: No History; Caffeine Use: Moderate; Financial Concerns: No; Food, Clothing or Shelter Needs: No; Support System Lacking: No; Transportation Concerns: No Electronic Signature(s) Signed: 11/27/2020 12:18:09 PM By: Donnamarie Poag Signed: 11/27/2020 4:50:13 PM By: Kalman Shan DO Entered By: Donnamarie Poag on 11/27/2020 10:57:32 Kenneth Cohen ([id number]) -------------------------------------------------------------------------------- SuperBill Details Patient Name: Kenneth Cohen Date of Service: 11/27/2020 Medical Record Number: [id number] Patient Account Number: [id number] Date of Birth/Sex: September 02, 1965 (55 y.o. M) Treating RN: Donnamarie Poag Primary Care Provider: Alma Friendly Other Clinician: Referring Provider: Alma Friendly Treating Provider/Extender: Yaakov Guthrie in Treatment: 0 Diagnosis Coding ICD-10 Codes Code Description [phone] Type 2 diabetes mellitus with foot ulcer L97.512 Non-pressure chronic ulcer of other part of right foot with fat layer exposed I10 Essential (primary) hypertension Z89.512 Acquired absence of left leg below knee L03.115 Cellulitis of right lower limb Facility Procedures CPT4 Code: [id number] Description: 99357 - WOUND CARE VISIT-LEV 3 EST PT Modifier: Quantity: 1 Electronic Signature(s) Signed: 11/27/2020 12:18:09 PM By:  Donnamarie Poag Signed: 11/27/2020 4:50:13 PM By: Heber Ione,  Suki Crockett DO Entered By: Donnamarie Poag on 11/27/2020 11:45:36

## 2020-12-02 ENCOUNTER — Other Ambulatory Visit: Payer: Self-pay

## 2020-12-02 ENCOUNTER — Ambulatory Visit (HOSPITAL_COMMUNITY)
Admission: RE | Admit: 2020-12-02 | Discharge: 2020-12-02 | Disposition: A | Discharge status: Home / Self Care | Payer: Medicare Other | Source: Ambulatory Visit | Attending: Internal Medicine | Admitting: Internal Medicine

## 2020-12-02 DIAGNOSIS — L97512 Non-pressure chronic ulcer of other part of right foot with fat layer exposed: Secondary | ICD-10-CM | POA: Diagnosis not present

## 2020-12-02 DIAGNOSIS — M868X7 Other osteomyelitis, ankle and foot: Secondary | ICD-10-CM | POA: Diagnosis not present

## 2020-12-02 DIAGNOSIS — E11621 Type 2 diabetes mellitus with foot ulcer: Secondary | ICD-10-CM | POA: Diagnosis not present

## 2020-12-02 DIAGNOSIS — M7989 Other specified soft tissue disorders: Secondary | ICD-10-CM | POA: Diagnosis not present

## 2020-12-02 DIAGNOSIS — L97509 Non-pressure chronic ulcer of other part of unspecified foot with unspecified severity: Secondary | ICD-10-CM | POA: Diagnosis not present

## 2020-12-04 ENCOUNTER — Other Ambulatory Visit: Payer: Self-pay

## 2020-12-04 ENCOUNTER — Encounter (HOSPITAL_BASED_OUTPATIENT_CLINIC_OR_DEPARTMENT_OTHER): Payer: Medicare Other | Admitting: Internal Medicine

## 2020-12-04 DIAGNOSIS — L97512 Non-pressure chronic ulcer of other part of right foot with fat layer exposed: Secondary | ICD-10-CM

## 2020-12-04 DIAGNOSIS — L03115 Cellulitis of right lower limb: Secondary | ICD-10-CM

## 2020-12-04 DIAGNOSIS — E11621 Type 2 diabetes mellitus with foot ulcer: Secondary | ICD-10-CM

## 2020-12-04 DIAGNOSIS — M869 Osteomyelitis, unspecified: Secondary | ICD-10-CM | POA: Diagnosis not present

## 2020-12-04 DIAGNOSIS — I10 Essential (primary) hypertension: Secondary | ICD-10-CM | POA: Diagnosis not present

## 2020-12-04 DIAGNOSIS — Z89512 Acquired absence of left leg below knee: Secondary | ICD-10-CM | POA: Diagnosis not present

## 2020-12-04 NOTE — Progress Notes (Addendum)
GLADE, STRAUSSER ([id number]) Visit Report for 12/04/2020 Arrival Information Details Patient Name: Kenneth Cohen, Kenneth Cohen Date of Service: 12/04/2020 3:15 PM Medical Record Number: [id number] Patient Account Number: [id number] Date of Birth/Sex: 11-21-1965 (55 y.o. M) Treating RN: Hansel Feinstein Primary Care Emilya Justen: Vernona Rieger Other Clinician: Referring Haylee Mcanany: Vernona Rieger Treating Kelaiah Escalona/Extender: Tilda Franco in Treatment: 1 Visit Information History Since Last Visit Added or deleted any medications: No Patient Arrived: Wheel Chair Had a fall or experienced change in No Arrival Time: 15:07 activities of daily living that may affect Accompanied By: wife risk of falls: Transfer Assistance: EasyPivot Patient Lift Hospitalized since last visit: No Patient Identification Verified: Yes Has Dressing in Place as Prescribed: Yes Secondary Verification Process Completed: Yes Pain Present Now: No Patient Has Alerts: Yes Patient Alerts: DIABETIC Electronic Signature(s) Signed: 12/04/2020 3:53:07 PM By: Hansel Feinstein Entered By: Hansel Feinstein on 12/04/2020 15:17:08 Kenneth Cohen ([id number]) -------------------------------------------------------------------------------- Clinic Level of Care Assessment Details Patient Name: Kenneth Cohen Date of Service: 12/04/2020 3:15 PM Medical Record Number: [id number] Patient Account Number: [id number] Date of Birth/Sex: [birth year] (55 y.o. M) Treating RN: Hansel Feinstein Primary Care Lendora Keys: Vernona Rieger Other Clinician: Referring Envi Eagleson: Vernona Rieger Treating Aalaysia Liggins/Extender: Tilda Franco in Treatment: 1 Clinic Level of Care Assessment Items TOOL 4 Quantity Score []  - Use when only an EandM is performed on FOLLOW-UP visit 0 ASSESSMENTS - Nursing Assessment / Reassessment []  - Reassessment of Co-morbidities (includes updates in patient status) 0 []  - 0 Reassessment of Adherence to Treatment Plan ASSESSMENTS -  Wound and Skin Assessment / Reassessment X - Simple Wound Assessment / Reassessment - one wound 1 5 []  - 0 Complex Wound Assessment / Reassessment - multiple wounds []  - 0 Dermatologic / Skin Assessment (not related to wound area) ASSESSMENTS - Focused Assessment []  - Circumferential Edema Measurements - multi extremities 0 []  - 0 Nutritional Assessment / Counseling / Intervention []  - 0 Lower Extremity Assessment (monofilament, tuning fork, pulses) []  - 0 Peripheral Arterial Disease Assessment (using hand held doppler) ASSESSMENTS - Ostomy and/or Continence Assessment and Care []  - Incontinence Assessment and Management 0 []  - 0 Ostomy Care Assessment and Management (repouching, etc.) PROCESS - Coordination of Care X - Simple Patient / Family Education for ongoing care 1 15 []  - 0 Complex (extensive) Patient / Family Education for ongoing care X- 1 10 Staff obtains Consents, Records, Test Results / Process Orders []  - 0 Staff telephones HHA, Nursing Homes / Clarify orders / etc []  - 0 Routine Transfer to another Facility (non-emergent condition) []  - 0 Routine Hospital Admission (non-emergent condition) []  - 0 New Admissions / / Ordering NPWT, Apligraf, etc. []  - 0 Emergency Hospital Admission (emergent condition) X- 1 10 Simple Discharge Coordination []  - 0 Complex (extensive) Discharge Coordination PROCESS - Special Needs []  - Pediatric / Minor Patient Management 0 []  - 0 Isolation Patient Management []  - 0 Hearing / Language / Visual special needs []  - 0 Assessment of Community assistance (transportation, D/C planning, etc.) []  - 0 Additional assistance / Altered mentation []  - 0 Support Surface(s) Assessment (bed, cushion, seat, etc.) INTERVENTIONS - Wound Cleansing / Measurement Therriault, Shiven ( ) X- 1 5 Simple Wound Cleansing - one wound []  - 0 Complex Wound Cleansing - multiple wounds X- 1 5 Wound Imaging (photographs -  any number of wounds) []  - 0 Wound Tracing (instead of photographs) X- 1 5 Simple Wound Measurement - one wound []  - 0 Complex Wound Measurement - multiple wounds INTERVENTIONS -  Wound Dressings X - Small Wound Dressing one or multiple wounds 1 10 []  - 0 Medium Wound Dressing one or multiple wounds []  - 0 Large Wound Dressing one or multiple wounds X- 1 5 Application of Medications - topical []  - 0 Application of Medications - injection INTERVENTIONS - Miscellaneous []  - External ear exam 0 []  - 0 Specimen Collection (cultures, biopsies, blood, body fluids, etc.) []  - 0 Specimen(s) / Culture(s) sent or taken to Lab for analysis []  - 0 Patient Transfer (multiple staff / Civil Service fast streamer / Similar devices) []  - 0 Simple Staple / Suture removal (25 or less) []  - 0 Complex Staple / Suture removal (26 or more) []  - 0 Hypo / Hyperglycemic Management (close monitor of Blood Glucose) []  - 0 Ankle / Brachial Index (ABI) - do not check if billed separately X- 1 5 Vital Signs Has the patient been seen at the hospital within the last three years: Yes Total Score: 75 Level Of Care: New/Established - Level 2 Electronic Signature(s) Signed: 12/04/2020 4:34:43 PM By: Donnamarie Poag Entered By: Donnamarie Poag on 12/04/2020 16:24:45 Kenneth Cohen ([id number]) -------------------------------------------------------------------------------- Encounter Discharge Information Details Patient Name: Kenneth Cohen Date of Service: 12/04/2020 3:15 PM Medical Record Number: [id number] Patient Account Number: [id number] Date of Birth/Sex: 1965/07/12 (55 y.o. M) Treating RN: Donnamarie Poag Primary Care Arieana Somoza: Alma Friendly Other Clinician: Referring Lyzbeth Genrich: Alma Friendly Treating Philo Kurtz/Extender: Yaakov Guthrie in Treatment: 1 Encounter Discharge Information Items Discharge Condition: Stable Ambulatory Status: Wheelchair Discharge Destination: Home Transportation: Private  Auto Accompanied By: wife Schedule Follow-up Appointment: Yes Clinical Summary of Care: Electronic Signature(s) Signed: 12/04/2020 4:34:43 PM By: Donnamarie Poag Entered By: Donnamarie Poag on 12/04/2020 [place], [place] ([id number]) -------------------------------------------------------------------------------- Lower Extremity Assessment Details Patient Name: Kenneth Cohen Date of Service: 12/04/2020 3:15 PM Medical Record Number: [id number] Patient Account Number: [id number] Date of Birth/Sex: [birth year] (55 y.o. M) Treating RN: Donnamarie Poag Primary Care Wilbur Labuda: Alma Friendly Other Clinician: Referring Dashana Guizar: Alma Friendly Treating Gladys Gutman/Extender: Yaakov Guthrie in Treatment: 1 Edema Assessment Assessed: [Left: No] [Right: Yes] [Left: Edema] [Right: :] Calf Left: Right: Point of Measurement: 36 cm From Medial Instep 47 cm Ankle Left: Right: Point of Measurement: 13 cm From Medial Instep 31.5 cm Knee To Floor Left: Right: From Medial Instep 46 cm Vascular Assessment Pulses: Dorsalis Pedis Palpable: [Right:No Yes] Electronic Signature(s) Signed: 12/04/2020 3:53:07 PM By: Donnamarie Poag Entered By: Donnamarie Poag on 12/04/2020 15:28:58 Kenneth Cohen ([id number]) -------------------------------------------------------------------------------- Multi Wound Chart Details Patient Name: Kenneth Cohen Date of Service: 12/04/2020 3:15 PM Medical Record Number: [id number] Patient Account Number: [id number] Date of Birth/Sex: 1965/11/20 (55 y.o. M) Treating RN: Donnamarie Poag Primary Care Sharmayne Jablon: Alma Friendly Other Clinician: Referring Victory Strollo: Alma Friendly Treating Tenika Keeran/Extender: Yaakov Guthrie in Treatment: 1 Vital Signs Height(in): 78 Pulse(bpm): 101 Weight(lbs): 341 Blood Pressure(mmHg): 139/87 Body Mass Index(BMI): 39 Temperature(F): 98.2 Respiratory Rate(breaths/min): 16 Photos: [N/A:N/A] Wound Location: Right, Lateral, Dorsal  Foot N/A N/A Wounding Event: Gradually Appeared N/A N/A Primary Etiology: Diabetic Wound/Ulcer of the Lower N/A N/A Extremity Comorbid History: Lymphedema, Sleep Apnea, N/A N/A Coronary Artery Disease, Hypertension, Type II Diabetes, Osteoarthritis, Neuropathy Date Acquired: 10/29/2020 N/A N/A Weeks of Treatment: 1 N/A N/A Wound Status: Open N/A N/A Measurements L x W x D (cm) 2.3x1x1.3 N/A N/A Area (cm) : 1.806 N/A N/A Volume (cm) : 2.348 N/A N/A % Reduction in Area: 36.80% N/A N/A % Reduction in Volume: 36.80% N/A N/A Starting Position 1 (o'clock): 6 Ending Position 1 (o'clock): 12 Maximum Distance 1 (  cm): 2.5 Undermining: Yes N/A N/A Classification: Grade 2 N/A N/A Exudate Amount: Large N/A N/A Exudate Type: Serosanguineous N/A N/A Exudate Color: red, brown N/A N/A Granulation Amount: Medium (34-66%) N/A N/A Granulation Quality: Red, Pink N/A N/A Necrotic Amount: Medium (34-66%) N/A N/A Exposed Structures: Fat Layer (Subcutaneous Tissue): N/A N/A Yes Fascia: No Tendon: No Muscle: No Joint: No Bone: No Treatment Notes Electronic Signature(s) Signed: 12/04/2020 4:48:09 PM By: Guy Begin, Lael ([id number]) Entered By: Kalman Shan on 12/04/2020 16:11:15 Kenneth Cohen ([id number]) -------------------------------------------------------------------------------- Multi-Disciplinary Care Plan Details Patient Name: Kenneth Cohen Date of Service: 12/04/2020 3:15 PM Medical Record Number: [id number] Patient Account Number: [id number] Date of Birth/Sex: 1965/12/17 (55 y.o. M) Treating RN: Donnamarie Poag Primary Care Ceceilia Cephus: Alma Friendly Other Clinician: Referring Lynnet Hefley: Alma Friendly Treating Jennaya Pogue/Extender: Yaakov Guthrie in Treatment: 1 Active Inactive Electronic Signature(s) Signed: 12/17/2020 2:09:19 PM By: Gretta Cool BSN, RN, CWS, Kim RN, BSN Signed: 12/17/2020 4:43:21 PM By: Donnamarie Poag Previous Signature: 12/04/2020 3:53:07  PM Version By: Donnamarie Poag Entered By: Gretta Cool BSN, RN, CWS, Kim on 12/17/2020 14:09:19 RUEL, TOSI ([id number]) -------------------------------------------------------------------------------- Pain Assessment Details Patient Name: Kenneth Cohen Date of Service: 12/04/2020 3:15 PM Medical Record Number: [id number] Patient Account Number: [id number] Date of Birth/Sex: 1966/01/05 (55 y.o. M) Treating RN: Donnamarie Poag Primary Care Marcianna Daily: Alma Friendly Other Clinician: Referring Horton Ellithorpe: Alma Friendly Treating Jisele Price/Extender: Yaakov Guthrie in Treatment: 1 Active Problems Location of Pain Severity and Description of Pain Patient Has Paino No Site Locations Rate the pain. Current Pain Level: 0 Pain Management and Medication Current Pain Management: Electronic Signature(s) Signed: 12/04/2020 3:53:07 PM By: Donnamarie Poag Entered By: Donnamarie Poag on 12/04/2020 15:20:10 Kenneth Cohen ([id number]) -------------------------------------------------------------------------------- Patient/Caregiver Education Details Patient Name: Kenneth Cohen Date of Service: 12/04/2020 3:15 PM Medical Record Number: [id number] Patient Account Number: [id number] Date of Birth/Gender: September 25, 1965 (56 y.o. M) Treating RN: Donnamarie Poag Primary Care Physician: Alma Friendly Other Clinician: Referring Physician: Alma Friendly Treating Physician/Extender: Yaakov Guthrie in Treatment: 1 Education Assessment Education Provided To: Patient and Caregiver Education Topics Provided Basic Hygiene: Infection: Wound Debridement: Wound/Skin Impairment: Electronic Signature(s) Signed: 12/04/2020 4:34:43 PM By: Donnamarie Poag Entered By: Donnamarie Poag on 12/04/2020 16:25:09 Kenneth Cohen ([id number]) -------------------------------------------------------------------------------- Wound Assessment Details Patient Name: Kenneth Cohen Date of Service: 12/04/2020 3:15 PM Medical Record  Number: [id number] Patient Account Number: [id number] Date of Birth/Sex: 10-07-1965 (55 y.o. M) Treating RN: Donnamarie Poag Primary Care Shakeitha Umbaugh: Alma Friendly Other Clinician: Referring Lorrin Bodner: Alma Friendly Treating Buffey Zabinski/Extender: Yaakov Guthrie in Treatment: 1 Wound Status Wound Number: 1 Primary Diabetic Wound/Ulcer of the Lower Extremity Etiology: Wound Location: Right, Lateral, Dorsal Foot Wound Open Wounding Event: Gradually Appeared Status: Date Acquired: 10/29/2020 Comorbid Lymphedema, Sleep Apnea, Coronary Artery Disease, Weeks Of Treatment: 1 History: Hypertension, Type II Diabetes, Osteoarthritis, Clustered Wound: No Neuropathy Photos Wound Measurements Length: (cm) 2.3 Width: (cm) 1 Depth: (cm) 1.3 Area: (cm) 1.806 Volume: (cm) 2.348 % Reduction in Area: 36.8% % Reduction in Volume: 36.8% Tunneling: No Undermining: Yes Starting Position (o'clock): 6 Ending Position (o'clock): 12 Maximum Distance: (cm) 2.5 Wound Description Classification: Grade 2 Exudate Amount: Large Exudate Type: Serosanguineous Exudate Color: red, brown Foul Odor After Cleansing: No Slough/Fibrino Yes Wound Bed Granulation Amount: Medium (34-66%) Exposed Structure Granulation Quality: Red, Pink Fascia Exposed: No Necrotic Amount: Medium (34-66%) Fat Layer (Subcutaneous Tissue) Exposed: Yes Necrotic Quality: Adherent Slough Tendon Exposed: No Muscle Exposed: No Joint Exposed: No Bone Exposed: No Electronic Signature(s) Signed: 12/04/2020 3:53:07 PM By: Donnamarie Poag Entered ByLyndel Safe,  Joy on 12/04/2020 15:27:00 ZYERE, ZUK ([id number]) -------------------------------------------------------------------------------- Vitals Details Patient Name: Kenneth Cohen Date of Service: 12/04/2020 3:15 PM Medical Record Number: [id number] Patient Account Number: [id number] Date of Birth/Sex: 03/31/1965 (56 y.o. M) Treating RN: Donnamarie Poag Primary Care Jansel Vonstein: Alma Friendly Other Clinician: Referring Kaylean Tupou: Alma Friendly Treating Marianna Cid/Extender: Yaakov Guthrie in Treatment: 1 Vital Signs Time Taken: 15:17 Temperature (F): 98.2 Height (in): 78 Pulse (bpm): 101 Weight (lbs): 341 Respiratory Rate (breaths/min): 16 Body Mass Index (BMI): 39.4 Blood Pressure (mmHg): 139/87 Reference Range: 80 - 120 mg / dl Electronic Signature(s) Signed: 12/04/2020 3:53:07 PM By: Donnamarie Poag Entered ByDonnamarie Poag on 12/04/2020 15:19:55

## 2020-12-04 NOTE — Progress Notes (Signed)
Kenneth Cohen, Kenneth Cohen ([id number]) Visit Report for 12/04/2020 Chief Complaint Document Details Patient Name: Kenneth Cohen, Kenneth Cohen Date of Service: 12/04/2020 3:15 PM Medical Record Number: [id number] Patient Account Number: [id number] Date of Birth/Sex: [birth year] (55 y.o. M) Treating RN: Donnamarie Poag Primary Care Provider: Alma Friendly Other Clinician: Referring Provider: Alma Friendly Treating Provider/Extender: Yaakov Guthrie in Treatment: 1 Information Obtained from: Patient Chief Complaint Right foot wound Electronic Signature(s) Signed: 12/04/2020 4:48:09 PM By: Kalman Shan DO Entered By: Kalman Shan on 12/04/2020 16:11:44 Kenneth Cohen ([id number]) -------------------------------------------------------------------------------- HPI Details Patient Name: Kenneth Cohen Date of Service: 12/04/2020 3:15 PM Medical Record Number: [id number] Patient Account Number: [id number] Date of Birth/Sex: 29-Sep-1965 (55 y.o. M) Treating RN: Donnamarie Poag Primary Care Provider: Alma Friendly Other Clinician: Referring Provider: Alma Friendly Treating Provider/Extender: Yaakov Guthrie in Treatment: 1 History of Present Illness HPI Description: Admission 11/27/2020 Mr. Kenneth Cohen is a 55 year old male with a past medical history of insulin-dependent previously uncontrolled type 2 diabetes with a current hemoglobin A1c of 6.8, essential hypertension, status post AKA on the left from complications of Charcot's foot that presents to the clinic for a 1-2 month history of right foot wound. He has been using Medihoney to the wound bed Twice daily. He has also been doing Epson salt soaks 30 minutes daily. He visited the ED on 10/25/2020 and started on Bactrim and Avelox for wound infection. His foot has progressively become more swollen over the past few weeks. He has heavy drainage. He denies purulent drainage. He currently denies pain to the foot. He walked in today only  wearing a sock. 11/16; patient presents for 1 week follow-up. Wife is present during the encounter. Overall he reports stability to the wound. He he denies fever/chills, nausea/vomiting, increased warmth or erythema to the leg. There is increased warmth and erythema to the foot. Electronic Signature(s) Signed: 12/04/2020 4:48:09 PM By: Kalman Shan DO Entered By: Kalman Shan on 12/04/2020 16:16:39 Kenneth Cohen ([id number]) -------------------------------------------------------------------------------- Physical Exam Details Patient Name: Kenneth Cohen Date of Service: 12/04/2020 3:15 PM Medical Record Number: [id number] Patient Account Number: [id number] Date of Birth/Sex: [birth year] (55 y.o. M) Treating RN: Donnamarie Poag Primary Care Provider: Alma Friendly Other Clinician: Referring Provider: Alma Friendly Treating Provider/Extender: Yaakov Guthrie in Treatment: 1 Constitutional . Psychiatric . Notes Right foot: To the lateral aspect there are 2 open wounds with significant serosanguineous drainage upon palpation. The foot is warm, Swollen and erythematous. The wounds are small but probe at least 5 cm in depth. This overlies bone. Electronic Signature(s) Signed: 12/04/2020 4:48:09 PM By: Kalman Shan DO Entered By: Kalman Shan on 12/04/2020 16:17:31 Kenneth Cohen ([id number]) -------------------------------------------------------------------------------- Physician Orders Details Patient Name: Kenneth Cohen Date of Service: 12/04/2020 3:15 PM Medical Record Number: [id number] Patient Account Number: [id number] Date of Birth/Sex: 1965/01/30 (55 y.o. M) Treating RN: Donnamarie Poag Primary Care Provider: Alma Friendly Other Clinician: Referring Provider: Alma Friendly Treating Provider/Extender: Yaakov Guthrie in Treatment: 1 Verbal / Phone Orders: No Diagnosis Coding Follow-up Appointments o Return Appointment in 1 week. o Nurse  Visit as needed [employer] o Wash wounds with antibacterial soap and water. - keep dressing dry o No tub bath. Edema Control - Lymphedema / Segmental Compressive Device / Other o Elevate leg(s) parallel to the floor when sitting. o DO YOUR BEST to sleep in the bed at night. DO NOT sleep in your recliner. Long hours of sitting in a recliner leads to swelling of the legs and/or potential wounds on your backside. Additional Orders /  Instructions o Follow Nutritious Diet and Increase Protein Intake - eat protein and continue to monitor blood sugar o Other: - stay off feet as much as possible and elevate legs Medications-Please add to medication list. o P.O. Antibiotics - Continue antibiotics and take as directed and pick up additional supply to add today o Other: - If signs of infection arise like fever or increased pain or redness, go to ER Wound Treatment Wound #1 - Foot Wound Laterality: Dorsal, Right, Lateral Cleanser: Byram Ancillary Kit - 15 Day Supply (Generic) 2 x Per Day/15 Days Discharge Instructions: Use supplies as instructed; Kit contains: (15) Saline Bullets; (15) 3x3 Gauze; 15 pr Gloves Cleanser: Soap and Water 2 x Per Day/15 Days Discharge Instructions: Gently cleanse wound with antibacterial soap, rinse and pat dry prior to dressing wounds Cleanser: Wound Cleanser (Generic) 2 x Per Day/15 Days Discharge Instructions: Wash your hands with soap and water. Remove old dressing, discard into plastic bag and place into trash. Cleanse the wound with Wound Cleanser prior to applying a clean dressing using gauze sponges, not tissues or cotton balls. Do not scrub or use excessive force. Pat dry using gauze sponges, not tissue or cotton balls. Primary Dressing: Silvercel Small 2x2 (in/in) (Generic) 2 x Per Day/15 Days Discharge Instructions: Apply Silvercel Small 2x2 (in/in) as instructed Secondary Dressing: Zetuvit Plus Silicone Border Dressing 4x4 (in/in)  (Generic) 2 x Per Day/15 Days Discharge Instructions: to absorb drainage on top Secured With: 60M Medipore H Soft Cloth Surgical Tape, 2x2 (in/yd) (Generic) 2 x Per Day/15 Days Secured With: Kerlix Roll Sterile or Non-Sterile 6-ply 4.5x4 (yd/yd) (Generic) 2 x Per Day/15 Days Discharge Instructions: Apply Kerlix as directed [facility] STAT referral sent to [facility] in [place] per request on 12/03/20 o Podiatry - Stat referral to Dr. Princess Perna set up 12/09/20 at 3:30pm Electronic Signature(s) Signed: 12/04/2020 4:34:43 PM By: Donnamarie Poag Signed: 12/04/2020 4:48:09 PM By: Guy Begin, Mostafa ([id number]) Previous Signature: 12/04/2020 4:16:15 PM Version By: Kalman Shan DO Entered By: Donnamarie Poag on 12/04/2020 16:24:20 Kenneth Cohen ([id number]) -------------------------------------------------------------------------------- Problem List Details Patient Name: Kenneth Cohen Date of Service: 12/04/2020 3:15 PM Medical Record Number: [id number] Patient Account Number: [id number] Date of Birth/Sex: [birth year] (55 y.o. M) Treating RN: Donnamarie Poag Primary Care Provider: Alma Friendly Other Clinician: Referring Provider: Alma Friendly Treating Provider/Extender: Yaakov Guthrie in Treatment: 1 Active Problems ICD-10 Encounter Code Description Active Date MDM Diagnosis E11.621 Type 2 diabetes mellitus with foot ulcer 11/27/2020 No Yes L97.512 Non-pressure chronic ulcer of other part of right foot with fat layer 11/27/2020 No Yes exposed [place] (primary) hypertension 11/27/2020 No Yes Z89.512 Acquired absence of left leg below knee 11/27/2020 No Yes L03.115 Cellulitis of right lower limb 11/27/2020 No Yes M86.9 Osteomyelitis, unspecified 12/04/2020 No Yes Inactive Problems Resolved Problems Electronic Signature(s) Signed: 12/04/2020 4:48:09 PM By: Kalman Shan DO Entered By: Kalman Shan on 12/04/2020  16:11:09 Kenneth Cohen ([id number]) -------------------------------------------------------------------------------- Progress Note Details Patient Name: Kenneth Cohen Date of Service: 12/04/2020 3:15 PM Medical Record Number: [id number] Patient Account Number: [id number] Date of Birth/Sex: 07-26-1965 (55 y.o. M) Treating RN: Donnamarie Poag Primary Care Provider: Alma Friendly Other Clinician: Referring Provider: Alma Friendly Treating Provider/Extender: Yaakov Guthrie in Treatment: 1 Subjective Chief Complaint Information obtained from Patient Right foot wound History of Present Illness (HPI) Admission 11/27/2020 Mr. Kenneth Cohen is a 55 year old male with a past medical history of insulin-dependent previously uncontrolled type 2 diabetes with a current hemoglobin A1c  of 6.8, essential hypertension, status post AKA on the left from complications of Charcot's foot that presents to the clinic for a 1-2 month history of right foot wound. He has been using Medihoney to the wound bed Twice daily. He has also been doing Epson salt soaks 30 minutes daily. He visited the ED on 10/25/2020 and started on Bactrim and Avelox for wound infection. His foot has progressively become more swollen over the past few weeks. He has heavy drainage. He denies purulent drainage. He currently denies pain to the foot. He walked in today only wearing a sock. 11/16; patient presents for 1 week follow-up. Wife is present during the encounter. Overall he reports stability to the wound. He he denies fever/chills, nausea/vomiting, increased warmth or erythema to the leg. There is increased warmth and erythema to the foot. Patient History Information obtained from Patient. Social History Former smoker - ended on 01/20/2004, Marital Status - Married, Alcohol Use - Never, Drug Use - No History, Caffeine Use - Moderate. Medical History Hematologic/Lymphatic Patient has history of Lymphedema Respiratory Patient  has history of Sleep Apnea Cardiovascular Patient has history of Coronary Artery Disease, Hypertension Endocrine Patient has history of Type II Diabetes Musculoskeletal Patient has history of Osteoarthritis Neurologic Patient has history of Neuropathy Objective Constitutional Vitals Time Taken: 3:17 PM, Height: 78 in, Weight: 341 lbs, BMI: 39.4, Temperature: 98.2 F, Pulse: 101 bpm, Respiratory Rate: 16 breaths/min, Blood Pressure: 139/87 mmHg. General Notes: Right foot: To the lateral aspect there are 2 open wounds with significant serosanguineous drainage upon palpation. The foot is warm, Swollen and erythematous. The wounds are small but probe at least 5 cm in depth. This overlies bone. Integumentary (Hair, Skin) Wound #1 status is Open. Original cause of wound was Gradually Appeared. The date acquired was: 10/29/2020. The wound has been in treatment 1 weeks. The wound is located on the Right,Lateral,Dorsal Foot. The wound measures 2.3cm length x 1cm width x 1.3cm depth; 1.806cm^2 area and 2.348cm^3 volume. There is Fat Layer (Subcutaneous Tissue) exposed. There is no tunneling noted, however, there is undermining starting at 6:00 and ending at 12:00 with a maximum distance of 2.5cm. There is a large amount of serosanguineous drainage noted. There is medium (34-66%) red, pink granulation within the wound bed. There is a medium (34-66%) amount of necrotic tissue within the Wallace, Winthrop ([id number]) wound bed including Adherent Slough. Assessment Active Problems ICD-10 Type 2 diabetes mellitus with foot ulcer Non-pressure chronic ulcer of other part of right foot with fat layer exposed Essential (primary) hypertension Acquired absence of left leg below knee Cellulitis of right lower limb Osteomyelitis, unspecified Patient had an MRI of his right foot on 12/02/2020 that showed acute osteomyelitis of the fifth metatarsal and base of the fifth toe. There is also acute osteomyelitis  of the second third and fourth metatarsal as well as the first metatarsal base. There is a large fluid collection to the dorsal aspect of the medial midfoot and forefoot as well as an underlying abscess to the fifth metatarsal shaft. I will extend his clindamycin. A stat referral was placed to podiatry. He will have an appointment with Dr. Jacqualyn Posey on 11/21 for further assessment. He can continue with his dressings of silver alginate. Plan Follow-up Appointments: Return Appointment in 1 week. Nurse Visit as needed Bathing/ Shower/ Hygiene: Wash wounds with antibacterial soap and water. - keep dressing dry No tub bath. Edema Control - Lymphedema / Segmental Compressive Device / Other: Elevate leg(s) parallel to the floor when  sitting. DO YOUR BEST to sleep in the bed at night. DO NOT sleep in your recliner. Long hours of sitting in a recliner leads to swelling of the legs and/or potential wounds on your backside. Additional Orders / Instructions: Follow Nutritious Diet and Increase Protein Intake - eat protein and continue to monitor blood sugar Other: - stay off feet as much as possible and elevate legs Medications-Please add to medication list.: P.O. Antibiotics - Continue antibiotics and take as directed and pick up additional supply to add today Other: - If signs of infection arise like fever or increased pain or redness, go to ER Consults ordered were: Orthopedics - Orthopedic surgical STAT referral sent to [facility] in [place] per request on 12/03/20, Podiatry - Stat referral to Dr. Princess Perna set up 12/09/20 at 3:30pm WOUND #1: - Foot Wound Laterality: Dorsal, Right, Lateral Cleanser: Byram Ancillary Kit - 15 Day Supply (Generic) 2 x Per Day/15 Days Discharge Instructions: Use supplies as instructed; Kit contains: (15) Saline Bullets; (15) 3x3 Gauze; 15 pr Gloves Cleanser: Soap and Water 2 x Per Day/15 Days Discharge Instructions: Gently cleanse wound with antibacterial soap,  rinse and pat dry prior to dressing wounds Cleanser: Wound Cleanser (Generic) 2 x Per Day/15 Days Discharge Instructions: Wash your hands with soap and water. Remove old dressing, discard into plastic bag and place into trash. Cleanse the wound with Wound Cleanser prior to applying a clean dressing using gauze sponges, not tissues or cotton balls. Do not scrub or use excessive force. Pat dry using gauze sponges, not tissue or cotton balls. Primary Dressing: Silvercel Small 2x2 (in/in) (Generic) 2 x Per Day/15 Days Discharge Instructions: Apply Silvercel Small 2x2 (in/in) as instructed Secondary Dressing: Zetuvit Plus Silicone Border Dressing 4x4 (in/in) (Generic) 2 x Per Day/15 Days Discharge Instructions: to absorb drainage on top Secured With: 13M Medipore H Soft Cloth Surgical Tape, 2x2 (in/yd) (Generic) 2 x Per Day/15 Days Secured With: Kerlix Roll Sterile or Non-Sterile 6-ply 4.5x4 (yd/yd) (Generic) 2 x Per Day/15 Days Discharge Instructions: Apply Kerlix as directed 1. Silver alginate 2. Podiatry referral and appointment on 11/21 3. Follow-up in 1 week 4. Extend clindamycin Kenneth Cohen, Kenneth Cohen ([id number]) Electronic Signature(s) Signed: 12/04/2020 4:48:09 PM By: Kalman Shan DO Entered By: Kalman Shan on 12/04/2020 16:47:43 Kenneth Cohen ([id number]) -------------------------------------------------------------------------------- ROS/PFSH Details Patient Name: Kenneth Cohen Date of Service: 12/04/2020 3:15 PM Medical Record Number: [id number] Patient Account Number: [id number] Date of Birth/Sex: September 19, 1965 (55 y.o. M) Treating RN: Donnamarie Poag Primary Care Provider: Alma Friendly Other Clinician: Referring Provider: Alma Friendly Treating Provider/Extender: Yaakov Guthrie in Treatment: 1 Information Obtained From Patient Hematologic/Lymphatic Medical History: Positive for: Lymphedema Respiratory Medical History: Positive for: Sleep  Apnea Cardiovascular Medical History: Positive for: Coronary Artery Disease; Hypertension Endocrine Medical History: Positive for: Type II Diabetes Time with diabetes: 2016 Treated with: Insulin, Oral agents Blood sugar tested every day: Yes Tested : dexcom Blood sugar testing results: Breakfast: 154 Musculoskeletal Medical History: Positive for: Osteoarthritis Neurologic Medical History: Positive for: Neuropathy Immunizations Pneumococcal Vaccine: Received Pneumococcal Vaccination: No Implantable Devices None Family and Social History Former smoker - ended on 01/20/2004; Marital Status - Married; Alcohol Use: Never; Drug Use: No History; Caffeine Use: Moderate; Financial Concerns: No; Food, Clothing or Shelter Needs: No; Support System Lacking: No; Transportation Concerns: No Electronic Signature(s) Signed: 12/04/2020 4:34:43 PM By: Donnamarie Poag Signed: 12/04/2020 4:48:09 PM By: Kalman Shan DO Entered By: Kalman Shan on 12/04/2020 16:16:45 Kenneth Cohen ([id number]) -------------------------------------------------------------------------------- SuperBill Details Patient Name: Kenneth Cohen Date  of Service: 12/04/2020 Medical Record Number: [id number] Patient Account Number: [id number] Date of Birth/Sex: Dec 05, 1965 (55 y.o. M) Treating RN: Donnamarie Poag Primary Care Provider: Alma Friendly Other Clinician: Referring Provider: Alma Friendly Treating Provider/Extender: Yaakov Guthrie in Treatment: 1 Diagnosis Coding ICD-10 Codes Code Description [phone] Type 2 diabetes mellitus with foot ulcer L97.512 Non-pressure chronic ulcer of other part of right foot with fat layer exposed I10 Essential (primary) hypertension Z89.512 Acquired absence of left leg below knee L03.115 Cellulitis of right lower limb M86.9 Osteomyelitis, unspecified Facility Procedures CPT4 Code: [id number] Description: [phone] - WOUND CARE VISIT-LEV 2 EST PT Modifier: Quantity:  1 Physician Procedures CPT4 Code: [id number] Description: 99214 - WC PHYS LEVEL 4 - EST PT Modifier: Quantity: 1 CPT4 Code: Description: ICD-10 Diagnosis Description L03.115 Cellulitis of right lower limb M86.9 Osteomyelitis, unspecified E11.621 Type 2 diabetes mellitus with foot ulcer L97.512 Non-pressure chronic ulcer of other part of right foot with fat layer e Modifier: xposed Quantity: Electronic Signature(s) Signed: 12/04/2020 4:48:09 PM By: Kalman Shan DO Previous Signature: 12/04/2020 4:34:43 PM Version By: Donnamarie Poag Entered By: Kalman Shan on 12/04/2020 16:47:53

## 2020-12-09 ENCOUNTER — Inpatient Hospital Stay (HOSPITAL_COMMUNITY)
Admission: EM | Admit: 2020-12-09 | Discharge: 2020-12-15 | DRG: 617 | Disposition: A | Discharge status: Home Health | Payer: Medicare Other | Source: Ambulatory Visit | Attending: Internal Medicine | Admitting: Internal Medicine

## 2020-12-09 ENCOUNTER — Ambulatory Visit (INDEPENDENT_AMBULATORY_CARE_PROVIDER_SITE_OTHER): Payer: Medicare Other | Admitting: Podiatry

## 2020-12-09 ENCOUNTER — Other Ambulatory Visit: Payer: Self-pay

## 2020-12-09 ENCOUNTER — Ambulatory Visit (INDEPENDENT_AMBULATORY_CARE_PROVIDER_SITE_OTHER): Payer: Medicare Other

## 2020-12-09 ENCOUNTER — Encounter (HOSPITAL_COMMUNITY): Payer: Self-pay

## 2020-12-09 ENCOUNTER — Emergency Department (HOSPITAL_COMMUNITY): Payer: Medicare Other

## 2020-12-09 DIAGNOSIS — M7989 Other specified soft tissue disorders: Secondary | ICD-10-CM | POA: Diagnosis not present

## 2020-12-09 DIAGNOSIS — S91301A Unspecified open wound, right foot, initial encounter: Secondary | ICD-10-CM

## 2020-12-09 DIAGNOSIS — M869 Osteomyelitis, unspecified: Secondary | ICD-10-CM | POA: Diagnosis present

## 2020-12-09 DIAGNOSIS — M009 Pyogenic arthritis, unspecified: Secondary | ICD-10-CM | POA: Diagnosis present

## 2020-12-09 DIAGNOSIS — L929 Granulomatous disorder of the skin and subcutaneous tissue, unspecified: Secondary | ICD-10-CM | POA: Diagnosis not present

## 2020-12-09 DIAGNOSIS — E871 Hypo-osmolality and hyponatremia: Secondary | ICD-10-CM | POA: Diagnosis present

## 2020-12-09 DIAGNOSIS — E43 Unspecified severe protein-calorie malnutrition: Secondary | ICD-10-CM

## 2020-12-09 DIAGNOSIS — J9811 Atelectasis: Secondary | ICD-10-CM | POA: Diagnosis present

## 2020-12-09 DIAGNOSIS — Z881 Allergy status to other antibiotic agents status: Secondary | ICD-10-CM

## 2020-12-09 DIAGNOSIS — D649 Anemia, unspecified: Secondary | ICD-10-CM | POA: Insufficient documentation

## 2020-12-09 DIAGNOSIS — Z20822 Contact with and (suspected) exposure to covid-19: Secondary | ICD-10-CM | POA: Diagnosis present

## 2020-12-09 DIAGNOSIS — L089 Local infection of the skin and subcutaneous tissue, unspecified: Secondary | ICD-10-CM | POA: Diagnosis not present

## 2020-12-09 DIAGNOSIS — E1165 Type 2 diabetes mellitus with hyperglycemia: Secondary | ICD-10-CM | POA: Diagnosis present

## 2020-12-09 DIAGNOSIS — Z23 Encounter for immunization: Secondary | ICD-10-CM

## 2020-12-09 DIAGNOSIS — E861 Hypovolemia: Secondary | ICD-10-CM | POA: Diagnosis present

## 2020-12-09 DIAGNOSIS — Z89512 Acquired absence of left leg below knee: Secondary | ICD-10-CM | POA: Diagnosis not present

## 2020-12-09 DIAGNOSIS — E118 Type 2 diabetes mellitus with unspecified complications: Secondary | ICD-10-CM | POA: Diagnosis not present

## 2020-12-09 DIAGNOSIS — I129 Hypertensive chronic kidney disease with stage 1 through stage 4 chronic kidney disease, or unspecified chronic kidney disease: Secondary | ICD-10-CM | POA: Diagnosis present

## 2020-12-09 DIAGNOSIS — E11628 Type 2 diabetes mellitus with other skin complications: Secondary | ICD-10-CM

## 2020-12-09 DIAGNOSIS — L97516 Non-pressure chronic ulcer of other part of right foot with bone involvement without evidence of necrosis: Secondary | ICD-10-CM | POA: Diagnosis present

## 2020-12-09 DIAGNOSIS — M86171 Other acute osteomyelitis, right ankle and foot: Secondary | ICD-10-CM | POA: Diagnosis not present

## 2020-12-09 DIAGNOSIS — L02619 Cutaneous abscess of unspecified foot: Secondary | ICD-10-CM | POA: Diagnosis not present

## 2020-12-09 DIAGNOSIS — D75839 Thrombocytosis, unspecified: Secondary | ICD-10-CM | POA: Diagnosis present

## 2020-12-09 DIAGNOSIS — Z794 Long term (current) use of insulin: Secondary | ICD-10-CM | POA: Diagnosis not present

## 2020-12-09 DIAGNOSIS — E1122 Type 2 diabetes mellitus with diabetic chronic kidney disease: Secondary | ICD-10-CM | POA: Diagnosis present

## 2020-12-09 DIAGNOSIS — L0390 Cellulitis, unspecified: Secondary | ICD-10-CM | POA: Diagnosis not present

## 2020-12-09 DIAGNOSIS — M86071 Acute hematogenous osteomyelitis, right ankle and foot: Secondary | ICD-10-CM

## 2020-12-09 DIAGNOSIS — L02415 Cutaneous abscess of right lower limb: Secondary | ICD-10-CM | POA: Diagnosis not present

## 2020-12-09 DIAGNOSIS — E785 Hyperlipidemia, unspecified: Secondary | ICD-10-CM | POA: Diagnosis present

## 2020-12-09 DIAGNOSIS — Z89511 Acquired absence of right leg below knee: Secondary | ICD-10-CM | POA: Diagnosis not present

## 2020-12-09 DIAGNOSIS — I10 Essential (primary) hypertension: Secondary | ICD-10-CM | POA: Diagnosis not present

## 2020-12-09 DIAGNOSIS — M86271 Subacute osteomyelitis, right ankle and foot: Secondary | ICD-10-CM

## 2020-12-09 DIAGNOSIS — R9431 Abnormal electrocardiogram [ECG] [EKG]: Secondary | ICD-10-CM | POA: Diagnosis present

## 2020-12-09 DIAGNOSIS — E11618 Type 2 diabetes mellitus with other diabetic arthropathy: Secondary | ICD-10-CM | POA: Diagnosis not present

## 2020-12-09 DIAGNOSIS — N179 Acute kidney failure, unspecified: Secondary | ICD-10-CM | POA: Diagnosis present

## 2020-12-09 DIAGNOSIS — N182 Chronic kidney disease, stage 2 (mild): Secondary | ICD-10-CM | POA: Diagnosis present

## 2020-12-09 DIAGNOSIS — E1169 Type 2 diabetes mellitus with other specified complication: Secondary | ICD-10-CM | POA: Diagnosis not present

## 2020-12-09 DIAGNOSIS — E46 Unspecified protein-calorie malnutrition: Secondary | ICD-10-CM | POA: Diagnosis present

## 2020-12-09 DIAGNOSIS — Z8249 Family history of ischemic heart disease and other diseases of the circulatory system: Secondary | ICD-10-CM

## 2020-12-09 DIAGNOSIS — E11621 Type 2 diabetes mellitus with foot ulcer: Secondary | ICD-10-CM | POA: Diagnosis present

## 2020-12-09 DIAGNOSIS — L03119 Cellulitis of unspecified part of limb: Secondary | ICD-10-CM

## 2020-12-09 DIAGNOSIS — L97512 Non-pressure chronic ulcer of other part of right foot with fat layer exposed: Secondary | ICD-10-CM | POA: Diagnosis not present

## 2020-12-09 DIAGNOSIS — D509 Iron deficiency anemia, unspecified: Secondary | ICD-10-CM | POA: Diagnosis present

## 2020-12-09 DIAGNOSIS — D631 Anemia in chronic kidney disease: Secondary | ICD-10-CM | POA: Diagnosis present

## 2020-12-09 DIAGNOSIS — R609 Edema, unspecified: Secondary | ICD-10-CM | POA: Diagnosis not present

## 2020-12-09 DIAGNOSIS — E11610 Type 2 diabetes mellitus with diabetic neuropathic arthropathy: Secondary | ICD-10-CM

## 2020-12-09 DIAGNOSIS — E669 Obesity, unspecified: Secondary | ICD-10-CM | POA: Diagnosis present

## 2020-12-09 DIAGNOSIS — Z87891 Personal history of nicotine dependence: Secondary | ICD-10-CM

## 2020-12-09 DIAGNOSIS — R531 Weakness: Secondary | ICD-10-CM | POA: Diagnosis not present

## 2020-12-09 DIAGNOSIS — Z7984 Long term (current) use of oral hypoglycemic drugs: Secondary | ICD-10-CM

## 2020-12-09 DIAGNOSIS — G8918 Other acute postprocedural pain: Secondary | ICD-10-CM | POA: Diagnosis not present

## 2020-12-09 DIAGNOSIS — Z6837 Body mass index (BMI) 37.0-37.9, adult: Secondary | ICD-10-CM

## 2020-12-09 DIAGNOSIS — Z91041 Radiographic dye allergy status: Secondary | ICD-10-CM

## 2020-12-09 DIAGNOSIS — M86461 Chronic osteomyelitis with draining sinus, right tibia and fibula: Secondary | ICD-10-CM | POA: Diagnosis not present

## 2020-12-09 DIAGNOSIS — Z833 Family history of diabetes mellitus: Secondary | ICD-10-CM

## 2020-12-09 DIAGNOSIS — L97819 Non-pressure chronic ulcer of other part of right lower leg with unspecified severity: Secondary | ICD-10-CM | POA: Diagnosis not present

## 2020-12-09 DIAGNOSIS — M14671 Charcot's joint, right ankle and foot: Secondary | ICD-10-CM | POA: Diagnosis not present

## 2020-12-09 HISTORY — DX: Subacute osteomyelitis, right ankle and foot: M86.271

## 2020-12-09 HISTORY — DX: Local infection of the skin and subcutaneous tissue, unspecified: E11.628

## 2020-12-09 HISTORY — DX: Local infection of the skin and subcutaneous tissue, unspecified: L08.9

## 2020-12-09 LAB — PROTIME-INR
INR: 1.1 (ref 0.8–1.2)
Prothrombin Time: 14.5 seconds (ref 11.4–15.2)

## 2020-12-09 LAB — CBC WITH DIFFERENTIAL/PLATELET
Abs Immature Granulocytes: 0.04 10*3/uL (ref 0.00–0.07)
Basophils Absolute: 0.1 10*3/uL (ref 0.0–0.1)
Basophils Relative: 1 %
Eosinophils Absolute: 0.2 10*3/uL (ref 0.0–0.5)
Eosinophils Relative: 2 %
HCT: 29 % — ABNORMAL LOW (ref 39.0–52.0)
Hemoglobin: 8.9 g/dL — ABNORMAL LOW (ref 13.0–17.0)
Immature Granulocytes: 0 %
Lymphocytes Relative: 15 %
Lymphs Abs: 1.5 10*3/uL (ref 0.7–4.0)
MCH: 25 pg — ABNORMAL LOW (ref 26.0–34.0)
MCHC: 30.7 g/dL (ref 30.0–36.0)
MCV: 81.5 fL (ref 80.0–100.0)
Monocytes Absolute: 0.5 10*3/uL (ref 0.1–1.0)
Monocytes Relative: 5 %
Neutro Abs: 7.6 10*3/uL (ref 1.7–7.7)
Neutrophils Relative %: 77 %
Platelets: 490 10*3/uL — ABNORMAL HIGH (ref 150–400)
RBC: 3.56 MIL/uL — ABNORMAL LOW (ref 4.22–5.81)
RDW: 15.8 % — ABNORMAL HIGH (ref 11.5–15.5)
WBC: 9.9 10*3/uL (ref 4.0–10.5)
nRBC: 0 % (ref 0.0–0.2)

## 2020-12-09 LAB — LACTIC ACID, PLASMA: Lactic Acid, Venous: 1.8 mmol/L (ref 0.5–1.9)

## 2020-12-09 LAB — COMPREHENSIVE METABOLIC PANEL
ALT: 13 U/L (ref 0–44)
AST: 15 U/L (ref 15–41)
Albumin: 2.6 g/dL — ABNORMAL LOW (ref 3.5–5.0)
Alkaline Phosphatase: 83 U/L (ref 38–126)
Anion gap: 12 (ref 5–15)
BUN: 21 mg/dL — ABNORMAL HIGH (ref 6–20)
CO2: 24 mmol/L (ref 22–32)
Calcium: 9.3 mg/dL (ref 8.9–10.3)
Chloride: 93 mmol/L — ABNORMAL LOW (ref 98–111)
Creatinine, Ser: 1.33 mg/dL — ABNORMAL HIGH (ref 0.61–1.24)
GFR, Estimated: >60 mL/min (ref >60)
Glucose, Bld: 185 mg/dL — ABNORMAL HIGH (ref 70–99)
Potassium: 4 mmol/L (ref 3.5–5.1)
Sodium: 129 mmol/L — ABNORMAL LOW (ref 135–145)
Total Bilirubin: 0.7 mg/dL (ref 0.3–1.2)
Total Protein: 8 g/dL (ref 6.5–8.1)

## 2020-12-09 LAB — URINALYSIS, ROUTINE W REFLEX MICROSCOPIC
Bacteria, UA: NONE SEEN
Bilirubin Urine: NEGATIVE
Glucose, UA: NEGATIVE mg/dL
Ketones, ur: NEGATIVE mg/dL
Leukocytes,Ua: NEGATIVE
Nitrite: NEGATIVE
Protein, ur: NEGATIVE mg/dL
Specific Gravity, Urine: 1.017 (ref 1.005–1.030)
pH: 5 (ref 5.0–8.0)

## 2020-12-09 LAB — APTT: aPTT: 35 seconds (ref 24–36)

## 2020-12-09 LAB — CBG MONITORING, ED
Glucose-Capillary: 158 mg/dL — ABNORMAL HIGH (ref 70–99)
Glucose-Capillary: 194 mg/dL — ABNORMAL HIGH (ref 70–99)
Glucose-Capillary: 204 mg/dL — ABNORMAL HIGH (ref 70–99)

## 2020-12-09 MED ORDER — INSULIN ASPART 100 UNIT/ML IJ SOLN
0.0000 [IU] | INTRAMUSCULAR | Status: DC
  Administered 2020-12-09: 21:00:00 5 [IU] via SUBCUTANEOUS
  Administered 2020-12-10 – 2020-12-11 (×9): 3 [IU] via SUBCUTANEOUS
  Administered 2020-12-11 – 2020-12-12 (×8): 2 [IU] via SUBCUTANEOUS
  Administered 2020-12-13: 3 [IU] via SUBCUTANEOUS
  Administered 2020-12-13: 2 [IU] via SUBCUTANEOUS
  Administered 2020-12-13: 3 [IU] via SUBCUTANEOUS
  Administered 2020-12-14: 2 [IU] via SUBCUTANEOUS
  Administered 2020-12-14: 3 [IU] via SUBCUTANEOUS
  Administered 2020-12-14 (×2): 2 [IU] via SUBCUTANEOUS
  Administered 2020-12-14: 3 [IU] via SUBCUTANEOUS
  Administered 2020-12-14 – 2020-12-15 (×3): 2 [IU] via SUBCUTANEOUS

## 2020-12-09 MED ORDER — SODIUM CHLORIDE 0.9 % IV SOLN
2.0000 g | INTRAVENOUS | Status: AC
  Administered 2020-12-09 – 2020-12-14 (×6): 2 g via INTRAVENOUS
  Filled 2020-12-09 (×6): qty 20

## 2020-12-09 MED ORDER — VANCOMYCIN HCL 1250 MG/250ML IV SOLN
1250.0000 mg | Freq: Two times a day (BID) | INTRAVENOUS | Status: DC
  Administered 2020-12-10 – 2020-12-12 (×5): 1250 mg via INTRAVENOUS
  Filled 2020-12-09 (×6): qty 250

## 2020-12-09 MED ORDER — ACETAMINOPHEN 650 MG RE SUPP: 650.0000 mg | Freq: Four times a day (QID) | RECTAL | Status: DC | PRN

## 2020-12-09 MED ORDER — HYDROMORPHONE HCL 1 MG/ML IJ SOLN
0.5000 mg | INTRAMUSCULAR | Status: DC | PRN
  Administered 2020-12-13: 0.5 mg via INTRAVENOUS
  Filled 2020-12-09: qty 0.5

## 2020-12-09 MED ORDER — ACETAMINOPHEN 325 MG PO TABS
650.0000 mg | ORAL_TABLET | Freq: Four times a day (QID) | ORAL | Status: DC | PRN
  Administered 2020-12-10 – 2020-12-12 (×9): 650 mg via ORAL
  Filled 2020-12-09 (×9): qty 2

## 2020-12-09 MED ORDER — SENNOSIDES-DOCUSATE SODIUM 8.6-50 MG PO TABS: 1.0000 | ORAL_TABLET | Freq: Every evening | ORAL | Status: DC | PRN

## 2020-12-09 MED ORDER — METRONIDAZOLE 500 MG/100ML IV SOLN
500.0000 mg | Freq: Two times a day (BID) | INTRAVENOUS | Status: DC
  Administered 2020-12-09 – 2020-12-10 (×2): 500 mg via INTRAVENOUS
  Filled 2020-12-09 (×2): qty 100

## 2020-12-09 MED ORDER — SODIUM CHLORIDE 0.9 % IV SOLN: INTRAVENOUS | Status: AC

## 2020-12-09 MED ORDER — LACTATED RINGERS IV BOLUS
1000.0000 mL | Freq: Once | INTRAVENOUS | Status: AC
  Administered 2020-12-09: 1000 mL via INTRAVENOUS

## 2020-12-09 MED ORDER — ACETAMINOPHEN 500 MG PO TABS
1000.0000 mg | ORAL_TABLET | Freq: Once | ORAL | Status: AC
  Administered 2020-12-09: 1000 mg via ORAL
  Filled 2020-12-09: qty 2

## 2020-12-09 MED ORDER — VANCOMYCIN HCL 10 G IV SOLR
2500.0000 mg | Freq: Once | INTRAVENOUS | Status: AC
  Administered 2020-12-09: 2500 mg via INTRAVENOUS
  Filled 2020-12-09: qty 2500

## 2020-12-09 MED ORDER — PIPERACILLIN-TAZOBACTAM 3.375 G IVPB 30 MIN
3.3750 g | Freq: Once | INTRAVENOUS | Status: AC
  Administered 2020-12-09: 3.375 g via INTRAVENOUS
  Filled 2020-12-09: qty 50

## 2020-12-09 MED ORDER — INSULIN GLARGINE-YFGN 100 UNIT/ML ~~LOC~~ SOLN
30.0000 [IU] | Freq: Two times a day (BID) | SUBCUTANEOUS | Status: DC
  Administered 2020-12-09 – 2020-12-15 (×11): 30 [IU] via SUBCUTANEOUS
  Filled 2020-12-09 (×14): qty 0.3

## 2020-12-09 MED ORDER — OXYCODONE HCL 5 MG PO TABS: 5.0000 mg | ORAL_TABLET | ORAL | Status: DC | PRN

## 2020-12-09 NOTE — ED Notes (Signed)
Pt sitting up in stretcher, wife at bedside. No distress noted and call light in reach. Dr Audria Nine at bedside assessing wound to R foot at this time.

## 2020-12-09 NOTE — H&P (Addendum)
History and Physical    Kenneth Cohen WYO:[id number] DOB: 06-23-1965 DOA: 12/09/2020  PCP: Doreene Nest, NP   Patient coming from: Home   Chief Complaint: Right foot infection   HPI: Kenneth Cohen is a pleasant 55 y.o. male with medical history significant for insulin-dependent diabetes mellitus, hypertension, history of left BKA, and right foot wound, now presenting to the emergency department from his podiatry clinic for evaluation of worsening right foot infection despite outpatient antibiotics.  Patient reports that he has been taking an antibiotic every 6 hours at home for the past 3 to 4 weeks but has not had any improvement in his right foot wound with surrounding redness, swelling, and bloody drainage.  He does not have much pain, or sensation, at the right foot, has had some chills, but does not believe that he is has been febrile.  He denies any chest pain, abdominal pain, nausea, vomiting, diarrhea, cough, or shortness of breath.  ED Course: Upon arrival to the ED, patient is found to be afebrile, saturating 100% on room air, slightly tachycardic, and with blood pressure 92/63.  EKG features sinus rhythm with RBBB and QTc 510 ms.  Chest x-ray negative for acute cardiopulmonary disease.  Plain films of the right foot demonstrate marked bony destruction of the midfoot and Lisfranc joint as well as fifth MTP concerning for osteomyelitis and confluent septic joints.  Chemistry panel notable for sodium 129, glucose 195, creatinine 1.33, and albumin 2.6.  CBC features a normocytic anemia with hemoglobin 8.9 and a mild thrombocytosis.  Blood cultures were collected from the emergency department, patient was given a liter of LR, acetaminophen, Zosyn, and vancomycin.  Review of Systems:  All other systems reviewed and apart from HPI, are negative.  Past Medical History:  Diagnosis Date   Diabetic Charcot foot (HCC)    Hypertension    Obesity    Testicular torsion    as a child     History reviewed. No pertinent surgical history.  Social History:   reports that he quit smoking about 16 years ago. He has never used smokeless tobacco. He reports that he does not drink alcohol and does not use drugs.  Allergies  Allergen Reactions   Cefaclor Itching, Swelling and Other (See Comments)    Reaction:  All-over body swelling   Did receive and tolerate cefazolin on adm 06/2016    Iodinated Diagnostic Agents Hives, Itching and Other (See Comments)    Immediately after receiving the iv contrast, patient started itching and developed welts on his forehead, back of head, chest and back.  ER Doctor and RN were notified and patient was told he should always be premedicated prior to receiving IV contrast.    Family History  Problem Relation Age of Onset   Hypertension Mother    Diabetes Mother    Diabetes Father      Prior to Admission medications   Medication Sig Start Date End Date Taking? Authorizing Provider  Aspirin-Salicylamide-Caffeine (BC HEADACHE POWDER PO) Take 1 packet by mouth daily as needed (for headaches).   Yes [provider]  atorvastatin (LIPITOR) 20 MG tablet Take 1 tablet (20 mg total) by mouth daily. For cholesterol. 04/30/20  Yes Doreene Nest, NP  Continuous Blood Gluc Sensor (DEXCOM G6 SENSOR) MISC 1 each by Does not apply route as directed. Patient taking differently: Inject 1 Device into the skin every 14 (fourteen) days. 04/30/20  Yes Doreene Nest, NP  amLODipine (NORVASC) 10 MG tablet  Take 1 tablet (10 mg total) by mouth daily. For blood pressure. 04/30/20   Doreene Nest, NP  Continuous Blood Gluc Transmit (DEXCOM G6 TRANSMITTER) MISC 1 each by Does not apply route as directed. 04/30/20   Doreene Nest, NP  fluticasone (FLONASE) 50 MCG/ACT nasal spray Place 2 sprays into both nostrils daily. 10/20/20   Claiborne Rigg, NP  hydrochlorothiazide (HYDRODIURIL) 25 MG tablet Take 1 tablet (25 mg total) by mouth daily. For  blood pressure. 04/30/20   Doreene Nest, NP  insulin NPH Human (NOVOLIN N RELION) 100 UNIT/ML injection Inject 50 units into the skin twice daily. 11/12/20   Doreene Nest, NP  insulin regular (NOVOLIN R RELION) 100 units/mL injection Inject 40 units into the skin three times daily before meals for diabetes. 11/12/20   Doreene Nest, NP  metFORMIN (GLUCOPHAGE) 500 MG tablet Take 2 tablets (1,000 mg total) by mouth 2 (two) times daily with a meal. For diabetes. 04/30/20   Doreene Nest, NP  olmesartan (BENICAR) 40 MG tablet Take 1 tablet (40 mg total) by mouth daily. For blood pressure. 04/30/20   Doreene Nest, NP  sildenafil (REVATIO) 20 MG tablet Take 2 tablets (40 mg total) by mouth as needed (30 minutes to 1 hour before intercourse). 03/29/19   Doreene Nest, NP  terbinafine (LAMISIL) 250 MG tablet Take 1 tablet (250 mg total) by mouth daily. 10/25/20 01/23/21  Theadora Rama Scales, PA-C    Physical Exam: Vitals:   12/09/20 1706 12/09/20 1837 12/09/20 1932  BP: 92/63 (!) 126/59   Pulse: (!) 105 (!) 112   Resp: 18 19   Temp: 98.6 F (37 C)    SpO2: 100% 99%   Weight:  (!) 154.7 kg (!) 154.7 kg  Height:   6\' 6"  (1.981 m)    Constitutional: NAD, anxious   Eyes: PERTLA, lids and conjunctivae normal ENMT: Mucous membranes are moist. Posterior pharynx clear of any exudate or lesions.   Neck: supple, no masses  Respiratory:  no wheezing, no crackles. No accessory muscle use.  Cardiovascular: S1 & S2 heard, regular rate and rhythm. No significant JVD. Abdomen: No distension, no tenderness, soft. Bowel sounds active.  Musculoskeletal: no clubbing / cyanosis. S/p left BKA.   Skin: lateral right foot wound with edema, erythema, bloody drainage. Warm, dry, well-perfused. Neurologic: CN 2-12 grossly intact. Right foot sensory deficit. Moving all extremities. Alert and oriented.  Psychiatric: Very pleasant. Cooperative. Anxious.    Labs and Imaging on Admission: I  have personally reviewed following labs and imaging studies  CBC: Recent Labs  Lab 12/09/20 1711  WBC 9.9  NEUTROABS 7.6  HGB 8.9*  HCT 29.0*  MCV 81.5  PLT 490*   Basic Metabolic Panel: Recent Labs  Lab 12/09/20 1711  NA 129*  K 4.0  CL 93*  CO2 24  GLUCOSE 185*  BUN 21*  CREATININE 1.33*  CALCIUM 9.3   GFR: Estimated Creatinine Clearance: 104.8 mL/min (A) (by C-G formula based on SCr of 1.33 mg/dL (H)). Liver Function Tests: Recent Labs  Lab 12/09/20 1711  AST 15  ALT 13  ALKPHOS 83  BILITOT 0.7  PROT 8.0  ALBUMIN 2.6*   No results for input(s): LIPASE, AMYLASE in the last 168 hours. No results for input(s): AMMONIA in the last 168 hours. Coagulation Profile: Recent Labs  Lab 12/09/20 1711  INR 1.1   Cardiac Enzymes: No results for input(s): CKTOTAL, CKMB, CKMBINDEX, TROPONINI in the last 168  hours. BNP (last 3 results) No results for input(s): PROBNP in the last 8760 hours. HbA1C: No results for input(s): HGBA1C in the last 72 hours. CBG: Recent Labs  Lab 12/09/20 1706 12/09/20 1838  GLUCAP 158* 194*   Lipid Profile: No results for input(s): CHOL, HDL, LDLCALC, TRIG, CHOLHDL, LDLDIRECT in the last 72 hours. Thyroid Function Tests: No results for input(s): TSH, T4TOTAL, FREET4, T3FREE, THYROIDAB in the last 72 hours. Anemia Panel: No results for input(s): VITAMINB12, FOLATE, FERRITIN, TIBC, IRON, RETICCTPCT in the last 72 hours. Urine analysis:    Component Value Date/Time   BILIRUBINUR small (A) 10/25/2020 1304   KETONESUR negative 10/25/2020 1304   PROTEINUR =30 (A) 10/25/2020 1304   UROBILINOGEN 4.0 (A) 10/25/2020 1304   NITRITE Negative 10/25/2020 1304   LEUKOCYTESUR Negative 10/25/2020 1304   Sepsis Labs: @LABRCNTIP (procalcitonin:4,lacticidven:4) )No results found for this or any previous visit (from the past 240 hour(s)).   Radiological Exams on Admission: DG Chest 2 View  Result Date: 12/09/2020 CLINICAL DATA:  Weakness  EXAM: CHEST - 2 VIEW COMPARISON:  None. FINDINGS: The heart size and mediastinal contours are within normal limits. Both lungs are clear. The visualized skeletal structures are unremarkable. IMPRESSION: No active cardiopulmonary disease. Electronically Signed   By: 12/11/2020 M.D.   On: 12/09/2020 18:09   DG Foot Complete Right  Result Date: 12/09/2020 CLINICAL DATA:  Weakness.  Diabetes.  Foot infection. EXAM: RIGHT FOOT COMPLETE - 3+ VIEW COMPARISON:  Radiographs from [facility] dated 12/09/2020 at 3:30 p.m., also MRI of the foot 12/02/2020 FINDINGS: Severe bony destructive findings involving the navicular and cuneiform bones with fragmentation and indistinctness of the structures. Mild to moderate demineralization and destructive findings in the cuboid. Substantial erosions and indistinct destructive findings involving the bases of the second through fifth metatarsals with extensive sclerosis and periostitis especially in the fourth and fifth metatarsals compatible with osteomyelitis. Large erosions involving the head of the fifth metatarsal and the base of the proximal phalanx fifth toe. Speckled calcifications in the soft tissues. Severe dorsal soft tissue swelling potentially with tiny locules of gas in the soft tissues. Notable soft tissue swelling above the talar head. Plantar and Achilles calcaneal spurs. IMPRESSION: 1. Extensive bony destructive findings in the midfoot and Lisfranc joint, as well as along the fifth MTP joint, likely mostly from osteomyelitis and multiple confluent septic joints, although a component of Charcot arthropathy is not excluded. 2. Marked soft tissue swelling along the dorsum of the foot, similar findings on the MRI from 12/02/2020 were due to abscess and subcutaneous edema. There is potentially some minimal gas loculation along the dorsal soft tissues of the foot. Electronically Signed   By: 12/04/2020 M.D.   On: 12/09/2020 18:12    EKG:  Independently reviewed. Sinus rhythm, RBBB, QTc 510 ms.   Assessment/Plan   1. Diabetic right foot infection with osteomyelitis  - Presents from podiatry office for worsening right foot infection despite 3-4 wks of oral antibiotic and found to have extensive osteomyelitis and confluent septic joints  - Blood cultures collected, vancomycin and Zosyn administered, and fluid bolus given in ED physician  - Check ABI and continue antibiotic coverage with vancomycin, Rocephin, and Flagyl   2. Insulin-dependent DM  - A1c improved to 6.8% in October 2022  - Continue CBG checks and insulin    3. Hyponatremia  - Serum sodium is 129 with glucose 185 in ED  - Hold HCTZ, continue NS infusion, repeat chem panel  in am   4. Hypertension  - SBP low 90s in ED and antihypertensives held initially    5. Prolonged QT interval  - QTc is 510 ms in ED  - Check magnesium, avoid QT-prolonging medications, repeat EKG in am    6. CKD II  - SCr is 1.33 on admission, similar to April 2022  - Monitor   7. Anemia  - Hgb 8.9 on admission, down from 13.7 in April 2022  - Pt reports bleeding from right foot wound  - Check anemia panel, monitor H&H, transfuse if needed     DVT prophylaxis: SCD pending surgery eval  Code Status: Full   Level of Care: Level of care: Med-Surg Family Communication: Wife updated at bedside  Disposition Plan:  Patient is from: Home  Anticipated d/c is to: TBD Anticipated d/c date is: 12/13/20 Patient currently: pending surgical consultation and plan  Consults called: None   Admission status: Inpatient     Briscoe Deutscher, MD [facility]  12/09/2020, 8:10 PM

## 2020-12-09 NOTE — ED Provider Notes (Signed)
[facility] EMERGENCY DEPARTMENT Provider Note   CSN: [id number] Arrival date & time: 12/09/20  1628     History No chief complaint on file.   Kenneth Cohen is a 55 y.o. male.  The history is provided by the patient and medical records.  Foot Pain Chronicity: acute on chronic. Episode onset: >4 weeks. The problem occurs constantly. The problem has been gradually worsening. Pertinent negatives include no chest pain, no abdominal pain, no headaches and no shortness of breath. Nothing aggravates the symptoms. Nothing relieves the symptoms. Treatments tried: PO abx and podiatry evaluation. The treatment provided no relief.      Past Medical History:  Diagnosis Date   Diabetic Charcot foot (HCC)    Hypertension    Obesity    Testicular torsion    as a child    Patient Active Problem List   Diagnosis Date Noted   Diabetic infection of right foot (HCC) 12/09/2020   Osteomyelitis (HCC) 12/09/2020   Hyponatremia 12/09/2020   CKD (chronic kidney disease), stage II 12/09/2020   Prolonged QT interval 12/09/2020   Normocytic anemia    Hx of BKA, left (HCC) 11/12/2020   Diabetic ulcer of foot with fat layer exposed (HCC) 11/12/2020   Erectile dysfunction 03/29/2019   Adjustment disorder with anxiety 08/19/2016   Charcot's joint, left ankle and foot 09/07/2015   Chronic venous insufficiency 07/16/2015   Preventative health care 10/02/2014   Hyperlipidemia 10/02/2014   Neuropathy associated with endocrine disorder (HCC) 10/02/2014   Essential hypertension 06/12/2014   Obesity (BMI 30-39.9) 06/12/2014   Diabetes mellitus type 2 with complications (HCC) 06/12/2014    History reviewed. No pertinent surgical history.     Family History  Problem Relation Age of Onset   Hypertension Mother    Diabetes Mother    Diabetes Father     Social History   Tobacco Use   Smoking status: Former    Types: Cigarettes    Quit date: 01/20/2004    Years since  quitting: 16.8   Smokeless tobacco: Never  Substance Use Topics   Alcohol use: No    Alcohol/week: 0.0 standard drinks    Comment: rarely   Drug use: No    Home Medications Prior to Admission medications   Medication Sig Start Date End Date Taking? Authorizing Provider  amLODipine (NORVASC) 10 MG tablet Take 1 tablet (10 mg total) by mouth daily. For blood pressure. 04/30/20  Yes Doreene Nest, NP  Aspirin-Salicylamide-Caffeine (BC HEADACHE POWDER PO) Take 1 packet by mouth daily as needed (for headaches).   Yes [provider]  atorvastatin (LIPITOR) 20 MG tablet Take 1 tablet (20 mg total) by mouth daily. For cholesterol. 04/30/20  Yes Doreene Nest, NP  clindamycin (CLEOCIN) 300 MG capsule Take 300 mg by mouth every 6 (six) hours. 12/04/20 12/11/20 Yes [provider]  Continuous Blood Gluc Sensor (DEXCOM G6 SENSOR) MISC 1 each by Does not apply route as directed. Patient taking differently: Inject 1 Device into the skin every 14 (fourteen) days. 04/30/20  Yes Doreene Nest, NP  fluticasone (FLONASE) 50 MCG/ACT nasal spray Place 2 sprays into both nostrils daily. Patient taking differently: Place 2 sprays into both nostrils daily as needed for allergies or rhinitis. 10/20/20  Yes Claiborne Rigg, NP  hydrochlorothiazide (HYDRODIURIL) 25 MG tablet Take 1 tablet (25 mg total) by mouth daily. For blood pressure. 04/30/20  Yes Doreene Nest, NP  insulin NPH Human (NOVOLIN N RELION) 100  UNIT/ML injection Inject 50 units into the skin twice daily. Patient taking differently: 50 Units in the morning and at bedtime. Inject 50 units into the skin twice daily. 11/12/20  Yes Pleas Koch, NP  insulin regular (NOVOLIN R RELION) 100 units/mL injection Inject 40 units into the skin three times daily before meals for diabetes. 11/12/20  Yes Pleas Koch, NP  metFORMIN (GLUCOPHAGE) 500 MG tablet Take 2 tablets (1,000 mg total) by mouth 2 (two) times daily with  a meal. For diabetes. 04/30/20  Yes Pleas Koch, NP  olmesartan (BENICAR) 40 MG tablet Take 1 tablet (40 mg total) by mouth daily. For blood pressure. 04/30/20  Yes Pleas Koch, NP  Continuous Blood Gluc Transmit (DEXCOM G6 TRANSMITTER) MISC 1 each by Does not apply route as directed. 04/30/20   Pleas Koch, NP  sildenafil (REVATIO) 20 MG tablet Take 2 tablets (40 mg total) by mouth as needed (30 minutes to 1 hour before intercourse). Patient not taking: Reported on 12/09/2020 03/29/19   Pleas Koch, NP  terbinafine (LAMISIL) 250 MG tablet Take 1 tablet (250 mg total) by mouth daily. Patient not taking: Reported on 12/09/2020 10/25/20 01/23/21  Lynden Oxford Scales, PA-C    Allergies    Cefaclor and Iodinated diagnostic agents  Review of Systems   Review of Systems  Constitutional:  Negative for chills and fever.  HENT:  Negative for ear pain and sore throat.   Eyes:  Negative for pain and visual disturbance.  Respiratory:  Negative for cough and shortness of breath.   Cardiovascular:  Negative for chest pain and palpitations.  Gastrointestinal:  Negative for abdominal pain and vomiting.  Genitourinary:  Negative for dysuria and hematuria.  Musculoskeletal:  Negative for arthralgias and back pain.  Skin:  Positive for wound. Negative for color change and rash.  Neurological:  Negative for seizures, syncope and headaches.  All other systems reviewed and are negative.  Physical Exam Updated Vital Signs BP (!) 102/57   Pulse 90   Temp 98.6 F (37 C)   Resp (!) 34   Ht 6\' 6"  (1.981 m)   Wt (!) 154.7 kg   SpO2 98%   BMI 39.41 kg/m   Physical Exam Vitals and nursing note reviewed.  Constitutional:      General: He is not in acute distress.    Appearance: Normal appearance. He is well-developed.  HENT:     Head: Normocephalic and atraumatic.     Right Ear: External ear normal.     Left Ear: External ear normal.     Nose: Nose normal. No congestion.      Mouth/Throat:     Mouth: Mucous membranes are moist.     Pharynx: Oropharynx is clear. No posterior oropharyngeal erythema.  Eyes:     Extraocular Movements: Extraocular movements intact.     Conjunctiva/sclera: Conjunctivae normal.     Pupils: Pupils are equal, round, and reactive to light.  Cardiovascular:     Rate and Rhythm: Normal rate and regular rhythm.     Pulses: Normal pulses.     Heart sounds: No murmur heard. Pulmonary:     Effort: Pulmonary effort is normal. No respiratory distress.     Breath sounds: Normal breath sounds. No wheezing, rhonchi or rales.  Abdominal:     General: Abdomen is flat. Bowel sounds are normal.     Palpations: Abdomen is soft.     Tenderness: There is no abdominal tenderness. There is no  guarding or rebound.  Musculoskeletal:        General: Swelling, tenderness and deformity (Multiple chronic appearing wounds to R medial ankle w/ surrounding warmth, erythem, purulent drainage. 2+ DP and PT pulses. Full ROM ankle.) present. Normal range of motion.     Cervical back: Normal range of motion and neck supple. No rigidity.  Skin:    General: Skin is warm and dry.     Capillary Refill: Capillary refill takes less than 2 seconds.     Findings: No rash.  Neurological:     General: No focal deficit present.     Mental Status: He is alert and oriented to person, place, and time.  Psychiatric:        Mood and Affect: Mood normal.    ED Results / Procedures / Treatments   Labs (all labs ordered are listed, but only abnormal results are displayed) Labs Reviewed  COMPREHENSIVE METABOLIC PANEL - Abnormal; Notable for the following components:      Result Value   Sodium 129 (*)    Chloride 93 (*)    Glucose, Bld 185 (*)    BUN 21 (*)    Creatinine, Ser 1.33 (*)    Albumin 2.6 (*)    All other components within normal limits  CBC WITH DIFFERENTIAL/PLATELET - Abnormal; Notable for the following components:   RBC 3.56 (*)    Hemoglobin 8.9 (*)    HCT  29.0 (*)    MCH 25.0 (*)    RDW 15.8 (*)    Platelets 490 (*)    All other components within normal limits  URINALYSIS, ROUTINE W REFLEX MICROSCOPIC - Abnormal; Notable for the following components:   APPearance HAZY (*)    Hgb urine dipstick SMALL (*)    All other components within normal limits  CBG MONITORING, ED - Abnormal; Notable for the following components:   Glucose-Capillary 158 (*)    All other components within normal limits  CBG MONITORING, ED - Abnormal; Notable for the following components:   Glucose-Capillary 194 (*)    All other components within normal limits  CBG MONITORING, ED - Abnormal; Notable for the following components:   Glucose-Capillary 204 (*)    All other components within normal limits  CULTURE, BLOOD (ROUTINE X 2)  CULTURE, BLOOD (ROUTINE X 2)  URINE CULTURE  LACTIC ACID, PLASMA  PROTIME-INR  APTT  LACTIC ACID, PLASMA  SEDIMENTATION RATE  SEDIMENTATION RATE  C-REACTIVE PROTEIN  C-REACTIVE PROTEIN  PREALBUMIN  BASIC METABOLIC PANEL  CBC  MAGNESIUM  VITAMIN B12  FOLATE  IRON AND TIBC  FERRITIN  RETICULOCYTES    EKG None  Radiology DG Chest 2 View  Result Date: 12/09/2020 CLINICAL DATA:  Weakness EXAM: CHEST - 2 VIEW COMPARISON:  None. FINDINGS: The heart size and mediastinal contours are within normal limits. Both lungs are clear. The visualized skeletal structures are unremarkable. IMPRESSION: No active cardiopulmonary disease. Electronically Signed   By: Donavan Foil M.D.   On: 12/09/2020 18:09   DG Foot Complete Right  Result Date: 12/09/2020 CLINICAL DATA:  Weakness.  Diabetes.  Foot infection. EXAM: RIGHT FOOT COMPLETE - 3+ VIEW COMPARISON:  [facility] dated 12/09/2020 at 3:30 p.m., also MRI of the foot 12/02/2020 FINDINGS: Severe bony destructive findings involving the navicular and cuneiform bones with fragmentation and indistinctness of the structures. Mild to moderate demineralization and  destructive findings in the cuboid. Substantial erosions and indistinct destructive findings involving the bases of the  second through fifth metatarsals with extensive sclerosis and periostitis especially in the fourth and fifth metatarsals compatible with osteomyelitis. Large erosions involving the head of the fifth metatarsal and the base of the proximal phalanx fifth toe. Speckled calcifications in the soft tissues. Severe dorsal soft tissue swelling potentially with tiny locules of gas in the soft tissues. Notable soft tissue swelling above the talar head. Plantar and Achilles calcaneal spurs. IMPRESSION: 1. Extensive bony destructive findings in the midfoot and Lisfranc joint, as well as along the fifth MTP joint, likely mostly from osteomyelitis and multiple confluent septic joints, although a component of Charcot arthropathy is not excluded. 2. Marked soft tissue swelling along the dorsum of the foot, similar findings on the MRI from 12/02/2020 were due to abscess and subcutaneous edema. There is potentially some minimal gas loculation along the dorsal soft tissues of the foot. Electronically Signed   By: Gaylyn Rong M.D.   On: 12/09/2020 18:12    Procedures Procedures   Medications Ordered in ED Medications  insulin glargine-yfgn (SEMGLEE) injection 30 Units (30 Units Subcutaneous Given 12/09/20 2153)  insulin aspart (novoLOG) injection 0-15 Units (5 Units Subcutaneous Given 12/09/20 2048)  cefTRIAXone (ROCEPHIN) 2 g in sodium chloride 0.9 % 100 mL IVPB (has no administration in time range)  metroNIDAZOLE (FLAGYL) IVPB 500 mg (0 mg Intravenous Stopped 12/09/20 2257)  0.9 %  sodium chloride infusion ( Intravenous New Bag/Given 12/09/20 2152)  acetaminophen (TYLENOL) tablet 650 mg (has no administration in time range)    Or  acetaminophen (TYLENOL) suppository 650 mg (has no administration in time range)  oxyCODONE (Oxy IR/ROXICODONE) immediate release tablet 5 mg (has no administration  in time range)  HYDROmorphone (DILAUDID) injection 0.5 mg (has no administration in time range)  senna-docusate (Senokot-S) tablet 1 tablet (has no administration in time range)  vancomycin (VANCOREADY) IVPB 1250 mg/250 mL (has no administration in time range)  vancomycin (VANCOCIN) 2,500 mg in sodium chloride 0.9 % 500 mL IVPB (0 mg Intravenous Stopped 12/09/20 2257)  piperacillin-tazobactam (ZOSYN) IVPB 3.375 g (0 g Intravenous Stopped 12/09/20 1952)  lactated ringers bolus 1,000 mL (0 mLs Intravenous Stopped 12/09/20 2048)  acetaminophen (TYLENOL) tablet 1,000 mg (1,000 mg Oral Given 12/09/20 2002)    ED Course  I have reviewed the triage vital signs and the nursing notes.  Pertinent labs & imaging results that were available during my care of the patient were reviewed by me and considered in my medical decision making (see chart for details).    MDM Rules/Calculators/A&P  55 y/o type 2 diabetic presenting w/ failed outpatient management of R foot wound. Tachycardic and borderline hypotensive on arrival concerning for impending sepsis. He was given IV fluids. Sepsis protocol initiated. Blood cx pending. XR concerning for R heel osteomyelitis. Will need admission for iv abx and orthopedics consult for likely amputation. Patient given LR bolus and started on vanc and zosyn. Hospitalist team contacted for admission. Hand off given.   Final Clinical Impression(s) / ED Diagnoses Final diagnoses:  Open wound of right foot, initial encounter    Rx / DC Orders ED Discharge Orders     None        Lutricia Feil, MD 12/09/20 2316    Gwyneth Sprout, MD 12/10/20 2247

## 2020-12-09 NOTE — Progress Notes (Signed)
Pharmacy Antibiotic Note  Kenneth Cohen is a 55 y.o. male admitted on 12/09/2020 with diabetic R foot infection. Afebrile and WBC 9.9. In ED started on zosyn x1, flagyl, ceftriaxone, and vancomycin. Pharmacy has been consulted for vancomycin dosing.  AUC: 400-550   Plan: Vancomycin 2500mg  x 1 load  Vancomycin 1250mg  q12h (eAUC 445, Scr 1.33, Vd 0.5) F/u renal function and adjust dosing as needed   Height: 6\' 6"  (198.1 cm) Weight: (!) 154.7 kg (341 lb 0.8 oz) IBW/kg (Calculated) : 91.4  Temp (24hrs), Avg:98.6 F (37 C), Min:98.6 F (37 C), Max:98.6 F (37 C)  Recent Labs  Lab 12/09/20 1711  WBC 9.9  CREATININE 1.33*  LATICACIDVEN 1.8    Estimated Creatinine Clearance: 104.8 mL/min (A) (by C-G formula based on SCr of 1.33 mg/dL (H)).    Allergies  Allergen Reactions   Cefaclor Itching, Swelling and Other (See Comments)    Reaction:  All-over body swelling   Did receive and tolerate cefazolin on adm 06/2016    Iodinated Diagnostic Agents Hives, Itching and Other (See Comments)    Immediately after receiving the iv contrast, patient started itching and developed welts on his forehead, back of head, chest and back.  ER Doctor and RN were notified and patient was told he should always be premedicated prior to receiving IV contrast.    Antimicrobials this admission: Vancomycin 11/21 > Flagyl 11/21 > Ceftriaxone 11/21 >  Dose adjustments this admission:  Microbiology results: 11/21 BCx: sent 11/21 UCx: sent   Thank you for allowing pharmacy to participate in this patient's care.  12/21, PharmD PGY1 Acute Care Resident  12/09/2020,9:58 PM

## 2020-12-09 NOTE — ED Provider Notes (Signed)
Emergency Medicine Provider Triage Evaluation Note  Kenneth Cohen , a 55 y.o. male  was evaluated in triage.  Pt complains of a right foot infection.  Been going on for weeks, seen by Dr. Loreta Ave podiatry earlier today and sent to ED due to concerns about osteomyelitis and infection.  Patient has an ulcer to the lateral aspect of his right foot.   Review of Systems  Positive: Foot ulcer, worsening infection Negative: Fever  Physical Exam  BP 92/63 (BP Location: Right Arm)   Pulse (!) 105   Temp 98.6 F (37 C)   Resp 18   SpO2 100%  Gen:   Awake, no distress   Resp:  Normal effort  MSK:   Moves extremities without difficulty  Other:  PT weak but palpable.  Patient has lateral bleeding ulcer to the right f foot Medical Decision Making  Medically screening exam initiated at 5:09 PM.  Appropriate orders placed.  Kenneth Cohen was informed that the remainder of the evaluation will be completed by another provider, this initial triage assessment does not replace that evaluation, and the importance of remaining in the ED until their evaluation is complete.  Soft blood pressure, tachycardic in triage.  Patient's not febrile but does have an obvious source of infection, initiating sepsis work-up.   [name], PA-C 12/09/20 1712    [name], MD 12/09/20 [phone]

## 2020-12-09 NOTE — ED Notes (Signed)
Admit provider at bedside 

## 2020-12-09 NOTE — ED Triage Notes (Signed)
Patient sent from foot center for worsening foot infection. Patient is diabetic and has taken antibiotics for the past 3-4 weeks. Patient alert and oriented. Denies pain

## 2020-12-09 NOTE — Progress Notes (Signed)
Subjective:   Patient ID: Kenneth Cohen, male   DOB: 55 y.o.   MRN: [id number]   HPI 55 year old male presents the office today for concerns of right foot abscess, infection.  He states he had a wound for some time and he recently been treated at the wound care center who referred for further evaluation and treatment given the abscess as well as osteomyelitis.  Currently denies fevers or chills.  He has a wound present on the lateral aspect of the foot he states this started as a blister.  He has been on oral antibiotics as well and despite this the foot is becoming more swollen and red.   Review of Systems  All other systems reviewed and are negative.  Past Medical History:  Diagnosis Date   Diabetic Charcot foot (HCC)    Hypertension    Obesity    Testicular torsion    as a child    No past surgical history on file.   Current Facility-Administered Medications:    mupirocin cream (BACTROBAN) 2 %, , Topical, BID, Hyatt, Max T, DPM  Current Outpatient Medications:    amLODipine (NORVASC) 10 MG tablet, Take 1 tablet (10 mg total) by mouth daily. For blood pressure., Disp: 90 tablet, Rfl: 3   atorvastatin (LIPITOR) 20 MG tablet, Take 1 tablet (20 mg total) by mouth daily. For cholesterol., Disp: 90 tablet, Rfl: 3   Continuous Blood Gluc Sensor (DEXCOM G6 SENSOR) MISC, 1 each by Does not apply route as directed., Disp: 9 each, Rfl: 3   Continuous Blood Gluc Transmit (DEXCOM G6 TRANSMITTER) MISC, 1 each by Does not apply route as directed., Disp: 1 each, Rfl: 3   fluticasone (FLONASE) 50 MCG/ACT nasal spray, Place 2 sprays into both nostrils daily., Disp: 16 g, Rfl: 6   hydrochlorothiazide (HYDRODIURIL) 25 MG tablet, Take 1 tablet (25 mg total) by mouth daily. For blood pressure., Disp: 90 tablet, Rfl: 3   insulin NPH Human (NOVOLIN N RELION) 100 UNIT/ML injection, Inject 50 units into the skin twice daily., Disp: 10 mL, Rfl: 5   insulin regular (NOVOLIN R RELION) 100 units/mL  injection, Inject 40 units into the skin three times daily before meals for diabetes., Disp: 10 mL, Rfl: 5   metFORMIN (GLUCOPHAGE) 500 MG tablet, Take 2 tablets (1,000 mg total) by mouth 2 (two) times daily with a meal. For diabetes., Disp: 360 tablet, Rfl: 3   olmesartan (BENICAR) 40 MG tablet, Take 1 tablet (40 mg total) by mouth daily. For blood pressure., Disp: 90 tablet, Rfl: 3   sildenafil (REVATIO) 20 MG tablet, Take 2 tablets (40 mg total) by mouth as needed (30 minutes to 1 hour before intercourse)., Disp: 10 tablet, Rfl: 0   terbinafine (LAMISIL) 250 MG tablet, Take 1 tablet (250 mg total) by mouth daily., Disp: 90 tablet, Rfl: 0  Facility-Administered Medications Ordered in Other Visits:    acetaminophen (TYLENOL) tablet 1,000 mg, 1,000 mg, Oral, Once, Plunkett, Whitney, MD   lactated ringers bolus 1,000 mL, 1,000 mL, Intravenous, Once, MacPherson, Jett, MD   piperacillin-tazobactam (ZOSYN) IVPB 3.375 g, 3.375 g, Intravenous, Once, MacPherson, Birdie Riddle, MD   vancomycin (VANCOCIN) 2,500 mg in sodium chloride 0.9 % 500 mL IVPB, 2,500 mg, Intravenous, Once, Lutricia Feil, MD  Allergies  Allergen Reactions   Cefaclor Itching, Swelling and Other (See Comments)    Received and tolerated cefazolin on adm 06/2016  Reaction:  All over body swelling  Received and tolerated cefazolin on adm 06/2016  Iodinated Diagnostic Agents Hives and Itching    Immediately after receiving the iv contrast, patient started itching and developed welts on his forehead, back of head, chest and back.  ER Doctor and RN were notified and patient was told he should always be premedicated prior to receiving IV contrast          Objective:  Physical Exam  General: AAO x3, NAD  Dermatological: 2 ulcerations noted to the lateral aspect of foot with granular tissue.  Clear drainage expressed.  Significant edema and erythema noted to the dorsal aspect of the foot.  No crepitation.  Vascular: Dorsalis Pedis artery  and Posterior Tibial artery pedal pulses are difficult to assess given the swelling.    Neruologic: Sensation decreased  Musculoskeletal: Left below-knee amputation.  Right foot without significant discomfort.     Assessment:   55 year old male with right foot abscess, significant osteomyelitis, Charcot     Plan:  -Treatment options discussed including all alternatives, risks, and complications -Etiology of symptoms were discussed -X-rays obtained reviewed.  Osteolysis noted the fifth metatarsal consistent with osteomyelitis as well as the midfoot.  Significant swelling present. -Also reviewed the MRI with the patient as well. -Given the infection and despite oral antibiotics this is continue to worsen.  At this point I recommend to be admitted to the hospital.  I discussed with him salvage versus amputation.  We can try incision and drainage, complete fifth ray resection and bone biopsy but I do think his best option is a below-knee amputation.  We notified [facility] ER of his arrival.  Podiatry will see the patient tomorrow but also I recommend consult to vascular surgery for likely below-knee amputation.  Vivi Barrack DPM

## 2020-12-09 NOTE — ED Notes (Signed)
Received verbal report from Gloria C RN at this time °

## 2020-12-10 ENCOUNTER — Inpatient Hospital Stay (HOSPITAL_COMMUNITY): Payer: Medicare Other

## 2020-12-10 DIAGNOSIS — N182 Chronic kidney disease, stage 2 (mild): Secondary | ICD-10-CM | POA: Diagnosis not present

## 2020-12-10 DIAGNOSIS — E11628 Type 2 diabetes mellitus with other skin complications: Secondary | ICD-10-CM | POA: Diagnosis not present

## 2020-12-10 DIAGNOSIS — I10 Essential (primary) hypertension: Secondary | ICD-10-CM | POA: Diagnosis not present

## 2020-12-10 DIAGNOSIS — E118 Type 2 diabetes mellitus with unspecified complications: Secondary | ICD-10-CM | POA: Diagnosis not present

## 2020-12-10 DIAGNOSIS — M86171 Other acute osteomyelitis, right ankle and foot: Secondary | ICD-10-CM

## 2020-12-10 DIAGNOSIS — Z89512 Acquired absence of left leg below knee: Secondary | ICD-10-CM

## 2020-12-10 DIAGNOSIS — Z89511 Acquired absence of right leg below knee: Secondary | ICD-10-CM

## 2020-12-10 DIAGNOSIS — L0390 Cellulitis, unspecified: Secondary | ICD-10-CM | POA: Diagnosis not present

## 2020-12-10 DIAGNOSIS — Z794 Long term (current) use of insulin: Secondary | ICD-10-CM | POA: Diagnosis not present

## 2020-12-10 DIAGNOSIS — E11618 Type 2 diabetes mellitus with other diabetic arthropathy: Secondary | ICD-10-CM | POA: Diagnosis not present

## 2020-12-10 DIAGNOSIS — M14671 Charcot's joint, right ankle and foot: Secondary | ICD-10-CM | POA: Diagnosis not present

## 2020-12-10 LAB — BASIC METABOLIC PANEL
Anion gap: 14 (ref 5–15)
BUN: 23 mg/dL — ABNORMAL HIGH (ref 6–20)
CO2: 20 mmol/L — ABNORMAL LOW (ref 22–32)
Calcium: 8.8 mg/dL — ABNORMAL LOW (ref 8.9–10.3)
Chloride: 97 mmol/L — ABNORMAL LOW (ref 98–111)
Creatinine, Ser: 1.49 mg/dL — ABNORMAL HIGH (ref 0.61–1.24)
GFR, Estimated: 55 mL/min — ABNORMAL LOW (ref >60)
Glucose, Bld: 165 mg/dL — ABNORMAL HIGH (ref 70–99)
Potassium: 3.9 mmol/L (ref 3.5–5.1)
Sodium: 131 mmol/L — ABNORMAL LOW (ref 135–145)

## 2020-12-10 LAB — C-REACTIVE PROTEIN
CRP: 11 mg/dL — ABNORMAL HIGH (ref <1.0)
CRP: 13 mg/dL — ABNORMAL HIGH (ref <1.0)

## 2020-12-10 LAB — GLUCOSE, CAPILLARY
Glucose-Capillary: 141 mg/dL — ABNORMAL HIGH (ref 70–99)
Glucose-Capillary: 156 mg/dL — ABNORMAL HIGH (ref 70–99)
Glucose-Capillary: 159 mg/dL — ABNORMAL HIGH (ref 70–99)
Glucose-Capillary: 161 mg/dL — ABNORMAL HIGH (ref 70–99)
Glucose-Capillary: 171 mg/dL — ABNORMAL HIGH (ref 70–99)
Glucose-Capillary: 171 mg/dL — ABNORMAL HIGH (ref 70–99)

## 2020-12-10 LAB — CBC
HCT: 26.2 % — ABNORMAL LOW (ref 39.0–52.0)
Hemoglobin: 8 g/dL — ABNORMAL LOW (ref 13.0–17.0)
MCH: 24.9 pg — ABNORMAL LOW (ref 26.0–34.0)
MCHC: 30.5 g/dL (ref 30.0–36.0)
MCV: 81.6 fL (ref 80.0–100.0)
Platelets: 414 10*3/uL — ABNORMAL HIGH (ref 150–400)
RBC: 3.21 MIL/uL — ABNORMAL LOW (ref 4.22–5.81)
RDW: 16 % — ABNORMAL HIGH (ref 11.5–15.5)
WBC: 8.9 10*3/uL (ref 4.0–10.5)
nRBC: 0 % (ref 0.0–0.2)

## 2020-12-10 LAB — RESP PANEL BY RT-PCR (FLU A&B, COVID) ARPGX2
Influenza A by PCR: NEGATIVE
Influenza B by PCR: NEGATIVE
SARS Coronavirus 2 by RT PCR: NEGATIVE

## 2020-12-10 LAB — RETICULOCYTES
Immature Retic Fract: 22.1 % — ABNORMAL HIGH (ref 2.3–15.9)
RBC.: 3.27 MIL/uL — ABNORMAL LOW (ref 4.22–5.81)
Retic Count, Absolute: 64.4 10*3/uL (ref 19.0–186.0)
Retic Ct Pct: 2 % (ref 0.4–3.1)

## 2020-12-10 LAB — FERRITIN: Ferritin: 390 ng/mL — ABNORMAL HIGH (ref 24–336)

## 2020-12-10 LAB — IRON AND TIBC
Iron: 23 ug/dL — ABNORMAL LOW (ref 45–182)
Saturation Ratios: 8 % — ABNORMAL LOW (ref 17.9–39.5)
TIBC: 274 ug/dL (ref 250–450)
UIBC: 251 ug/dL

## 2020-12-10 LAB — MAGNESIUM: Magnesium: 1.6 mg/dL — ABNORMAL LOW (ref 1.7–2.4)

## 2020-12-10 LAB — CBG MONITORING, ED
Glucose-Capillary: 166 mg/dL — ABNORMAL HIGH (ref 70–99)
Glucose-Capillary: 190 mg/dL — ABNORMAL HIGH (ref 70–99)

## 2020-12-10 LAB — VITAMIN B12: Vitamin B-12: 415 pg/mL (ref 180–914)

## 2020-12-10 LAB — FOLATE: Folate: 8.4 ng/mL (ref >5.9)

## 2020-12-10 LAB — SEDIMENTATION RATE
Sed Rate: 81 mm/hr — ABNORMAL HIGH (ref 0–16)
Sed Rate: 113 mm/hr — ABNORMAL HIGH (ref 0–16)

## 2020-12-10 LAB — PREALBUMIN: Prealbumin: 8.3 mg/dL — ABNORMAL LOW (ref 18–38)

## 2020-12-10 LAB — LACTIC ACID, PLASMA: Lactic Acid, Venous: 1.2 mmol/L (ref 0.5–1.9)

## 2020-12-10 MED ORDER — METFORMIN HCL 500 MG PO TABS
1000.0000 mg | ORAL_TABLET | Freq: Two times a day (BID) | ORAL | Status: DC
  Administered 2020-12-10 – 2020-12-15 (×8): 1000 mg via ORAL
  Filled 2020-12-10 (×8): qty 2

## 2020-12-10 MED ORDER — ATORVASTATIN CALCIUM 10 MG PO TABS
20.0000 mg | ORAL_TABLET | Freq: Every day | ORAL | Status: DC
  Administered 2020-12-10 – 2020-12-15 (×5): 20 mg via ORAL
  Filled 2020-12-10 (×5): qty 2

## 2020-12-10 MED ORDER — INFLUENZA VAC SPLIT QUAD 0.5 ML IM SUSY
0.5000 mL | PREFILLED_SYRINGE | INTRAMUSCULAR | Status: AC
  Administered 2020-12-14: 09:00:00 0.5 mL via INTRAMUSCULAR
  Filled 2020-12-10: qty 0.5

## 2020-12-10 NOTE — Consult Note (Signed)
ASSESSMENT & PLAN   DIABETIC FOOT INFECTION: Based on my exam and his noninvasive assessment he has no evidence of significant peripheral arterial disease.  The patient has a Charcot foot with extensive infection of the lateral foot.  Previous MRI suggesting an abscess in the right foot and also significant areas of osteomyelitis and septic arthritis.  No further work-up indicated from a vascular standpoint.  This is clearly a limb threatening situation and would get one of the orthopedic surgeons with extensive foot experience to see the patient in consultation.  Of note please speak to the patient or his wife about who they would like to see as they have some specific concerns.  Vascular surgery will be available as needed.  REASON FOR CONSULT:    Right foot infection.  The consult is requested by Dr. Ella Jubilee.  HPI:   Kenneth Cohen is a 55 y.o. male who was admitted yesterday with a right foot infection.  He has a history of insulin-dependent diabetes and hypertension.  He is undergone a previous left below the knee amputation.  He was referred by his podiatry clinic for a worsening right foot infection despite outpatient antibiotics.  The patient does not remember any specific injury to the foot.  He has had the wound for 1 to 2 months.  Recently he noticed increased drainage from the lateral foot.  He states that he has been on po antibiotics but the wound was not improving.  He was therefore admitted for intravenous antibiotics.  He denies any history of claudication or rest pain.  He is ambulatory with a prosthesis.  He had a left below the knee amputation approximately 6 years ago.  He denies any fever or chills  His risk factors for peripheral arterial disease include hypertension, insulin-dependent diabetes, hypercholesterolemia, and a remote history of tobacco use.  He quit 20 years ago.  He denies any family history of premature cardiovascular disease.  Past Medical History:   Diagnosis Date   Diabetic Charcot foot (HCC)    Hypertension    Obesity    Testicular torsion    as a child    Family History  Problem Relation Age of Onset   Hypertension Mother    Diabetes Mother    Diabetes Father     SOCIAL HISTORY: Social History   Tobacco Use   Smoking status: Former    Types: Cigarettes    Quit date: 01/20/2004    Years since quitting: 16.9   Smokeless tobacco: Never  Substance Use Topics   Alcohol use: No    Alcohol/week: 0.0 standard drinks    Comment: rarely    Allergies  Allergen Reactions   Cefaclor Itching, Swelling and Other (See Comments)    Reaction:  All-over body swelling   Did receive and tolerate cefazolin on adm 06/2016    Iodinated Diagnostic Agents Hives, Itching and Other (See Comments)    Immediately after receiving the iv contrast, patient started itching and developed welts on his forehead, back of head, chest and back.  ER Doctor and RN were notified and patient was told he should always be premedicated prior to receiving IV contrast.    Current Facility-Administered Medications  Medication Dose Route Frequency Provider Last Rate Last Admin   acetaminophen (TYLENOL) tablet 650 mg  650 mg Oral Q6H PRN Opyd, Lavone Neri, MD   650 mg at 12/10/20 1548   Or   acetaminophen (TYLENOL) suppository 650 mg  650 mg Rectal Q6H PRN Opyd,  Lavone Neri, MD       cefTRIAXone (ROCEPHIN) 2 g in sodium chloride 0.9 % 100 mL IVPB  2 g Intravenous Q24H Opyd, Lavone Neri, MD   Stopped at 12/10/20 0020   HYDROmorphone (DILAUDID) injection 0.5 mg  0.5 mg Intravenous Q3H PRN Opyd, Lavone Neri, MD       [START ON 12/11/2020] influenza vac split quadrivalent PF (FLUARIX) injection 0.5 mL  0.5 mL Intramuscular Tomorrow-1000 Opyd, Lavone Neri, MD       insulin aspart (novoLOG) injection 0-15 Units  0-15 Units Subcutaneous Q4H Opyd, Lavone Neri, MD   3 Units at 12/10/20 1548   insulin glargine-yfgn (SEMGLEE) injection 30 Units  30 Units Subcutaneous BID Briscoe Deutscher, MD   30 Units at 12/10/20 0854   metroNIDAZOLE (FLAGYL) IVPB 500 mg  500 mg Intravenous Q12H Briscoe Deutscher, MD 100 mL/hr at 12/10/20 0853 500 mg at 12/10/20 7858   oxyCODONE (Oxy IR/ROXICODONE) immediate release tablet 5 mg  5 mg Oral Q4H PRN Opyd, Lavone Neri, MD       senna-docusate (Senokot-S) tablet 1 tablet  1 tablet Oral QHS PRN Opyd, Lavone Neri, MD       vancomycin (VANCOREADY) IVPB 1250 mg/250 mL  1,250 mg Intravenous Q12H Marja Kays, RPH 166.7 mL/hr at 12/10/20 0842 1,250 mg at 12/10/20 8502    REVIEW OF SYSTEMS:  [X]  denotes positive finding, [ ]  denotes negative finding Cardiac  Comments:  Chest pain or chest pressure:    Shortness of breath upon exertion:    Short of breath when lying flat:    Irregular heart rhythm:        Vascular    Pain in calf, thigh, or hip brought on by ambulation:    Pain in feet at night that wakes you up from your sleep:     Blood clot in your veins:    Leg swelling:         Pulmonary    Oxygen at home:    Productive cough:     Wheezing:         Neurologic    Sudden weakness in arms or legs:     Sudden numbness in arms or legs:     Sudden onset of difficulty speaking or slurred speech:    Temporary loss of vision in one eye:     Problems with dizziness:         Gastrointestinal    Blood in stool:     Vomited blood:         Genitourinary    Burning when urinating:     Blood in urine:        Psychiatric    Major depression:         Hematologic    Bleeding problems:    Problems with blood clotting too easily:        Skin    Rashes or ulcers: x Right foot      Constitutional    Fever or chills:    -  PHYSICAL EXAM:   Vitals:   12/10/20 0351 12/10/20 0448 12/10/20 0700 12/10/20 1405  BP:  96/66 106/62 113/69  Pulse:  93 92 91  Resp:  19 18 17   Temp: 98.7 F (37.1 C) 99.5 F (37.5 C) 98.6 F (37 C) 98.2 F (36.8 C)  TempSrc: Oral Oral Oral Oral  SpO2:  99% 97% 98%  Weight:  (!) 147.3 kg    Height:  6'  6" (1.981 m)     Body mass index is 37.53 kg/m. GENERAL: The patient is a well-nourished male, in no acute distress. The vital signs are documented above. CARDIAC: There is a regular rate and rhythm.  VASCULAR: I do not detect carotid bruits. On the right side, he has a palpable femoral, popliteal, and posterior tibial pulse. He has multiphasic signals in the right anterior tibial and posterior tibial positions. He has hyperpigmentation consistent with chronic venous insufficiency. He has significant right lower extremity swelling and significant swelling in the foot as documented in the photograph below. PULMONARY: There is good air exchange bilaterally without wheezing or rales. ABDOMEN: Soft and non-tender with normal pitched bowel sounds.  MUSCULOSKELETAL: He has a Charcot foot on the right. NEUROLOGIC: No focal weakness or paresthesias are detected. SKIN: Patient has cellulitis and drainage from the lateral aspect of his right foot.  The bone can be palpated with a Q-tip.   PSYCHIATRIC: The patient has a normal affect.  DATA:    ARTERIAL DOPPLER STUDY: I have independently interpreted his arterial Doppler study today.  This was of the right lower extremity only as he has a left below the knee amputation.  On the right side there is a biphasic dorsalis pedis signal with a triphasic posterior tibial signal.  ABIs 100%.  Toe pressures 89 mmHg.  X-ray right foot: 1. Extensive bony destructive findings in the midfoot and Lisfranc joint, as well as along the fifth MTP joint, likely mostly from osteomyelitis and multiple confluent septic joints, although a component of Charcot arthropathy is not excluded. 2. Marked soft tissue swelling along the dorsum of the foot, similar findings on the MRI from 12/02/2020 were due to abscess and subcutaneous edema. There is potentially some minimal gas loculation along the dorsal soft tissues of the foot.  MRI RIGHT FOOT: The patient had an MRI of the  right foot on 12/02/2020.  This showed a soft tissue ulceration along the lateral aspect of the forefoot at the level of the fifth metatarsal shaft with underlying abscess.  Patient had acute osteomyelitis of the fifth metatarsal and base of the fifth toe proximal phalanx.  There was also acute osteomyelitis of the second third and fourth metatarsals as well as the first metatarsal base.  There were complex midfoot joint effusions likely from septic arthritis.  There was a large fluid collection at the dorsal aspect of the medial midfoot.  LABS: His creatinine is 1.49.  GFR is 55.  Waverly Ferrari [facility]

## 2020-12-10 NOTE — Progress Notes (Signed)
ABI completed. Refer to "CV Proc" under chart review to view preliminary results.  12/10/2020 11:53 AM Eula Fried., MHA, RVT, RDCS, RDMS

## 2020-12-10 NOTE — Progress Notes (Signed)
PROGRESS NOTE    Kenneth Cohen  [id number] DOB: 09-20-1965 DOA: 12/09/2020 PCP: Pleas Koch, NP    Brief Narrative:  Mr. Kenneth Cohen was admitted to the hospital the working diagnosis of diabetic right foot infection complicated with osteomyelitis,  55 yo male with the past medical history of T2DM, HTN, left BKA and right foot wound who presented with right foot infection. Patient had right foot edema, erythema and tenderness for about 3 to 4 weeks, he has been followed by podiatry as outpatient and he has been treated with oral antibiotic therapy. Follow up with podiatry on 11/21, found persistent infection, and he was referred to the hospital for IV antibiotic therapy and vascular surgery evaluation, for possible below the knee amputation. On his initial physical examination his blood pressure was 106/62, HR 92, RR18, and oxygen saturation 97%, his lungs were clear to auscultation, heart S1 and S2 present and rhythmic, abdomen soft and non tender, right foot with edema, erythema. Left BKA.   Na 129, K 4,0, Cl 93, bicarbonate 24, glucose 185, BUN 21, and serum cr 1,33,  CRP 111 and 13 Wbc 8,9, hgb 8,0 hct 26,2, and plt 414 SARS COVID 19 negative  Urine analysis with sg 1,017, negative nitrites.   Chest film with bibasilar atelectasis  EKG 99 bpm, normal axis, right bundle branch block, Qtc 510, sinus rhythm, with no significant St segment or T wave changes.   Right foot x ray with extensive bony destructive findings in the midfoot and lisfrac joint, and MTP joint, likely osteomyelitis and multiple confluent septic joints. Dorsum foot with soft tissue edema possible abscess.    Assessment & Plan:   Principal Problem:   Diabetic infection of right foot ([place]) Active Problems:   Essential hypertension   Diabetes mellitus type 2 with complications ([place])   Osteomyelitis (HCC)   Hyponatremia   CKD (chronic kidney disease), stage II   Prolonged QT interval   Right diabetic  foot infection, osteomyelitis, failed outpatient oral antibiotic therapy.  Continue to have edema and erythema on his right foot. He has remained afebrile, wbc this am is 8,9.  Plan to continue with broad spectrum antibiotic therapy with vancomycin and ceftriaxone IV. Consulted vascular surgery for further recommendations Continue pain control and GI prophylaxis.  Pain control with oxycodone and hydromorphone   2. AKI on CKD stage 2,  Hypovolemic hyponatremia. Serum Na this am up to 131, K 3,9 and serum bicarbonate at 20, Cl 97 and serum cr at 1,49.   Plan to continue with isotonic IV fluids and follow up renal function in am. Vancomycin dosing per pharmacy protocol Avoid hypotension or nephrotoxic medications.   3. T2DM/ dyslipidemia. Uncontrolled hyperglycemia, fasting glucose this am 318. Continue glucose cover and monitoring with insulin sliding scale and basal insulin 30 units bid.  Resume metformin   Continue with statin therapy.   4. HTN prolonged QTc Continue blood pressure monitoring. Continue holding on amlodipine, HCZT and olmesartan to prevent hypotension.   Patient continue to be at high risk for worsening foot infection   Status is: Inpatient  Remains inpatient appropriate because: IV antibiotics and vascular surgery evaluation    DVT prophylaxis: Enoxaparin   Code Status:    full  Family Communication:   I spoke with patient's wife at the bedside, we talked in detail about patient's condition, plan of care and prognosis and all questions were addressed.      Nutrition Status: Nutrition Problem: Increased nutrient needs Etiology: wound healing  Signs/Symptoms: estimated needs Interventions: Refer to RD note for recommendations     Consultants:  Vascular surgery    Antimicrobials:  Vancomycin and ceftriaxone     Subjective: Patient continue to have edema and erythema at his right foot, with no nausea or vomiting, no chest pain or dyspnea.    Objective: Vitals:   12/10/20 0351 12/10/20 0448 12/10/20 0700 12/10/20 1405  BP:  96/66 106/62 113/69  Pulse:  93 92 91  Resp:  19 18 17   Temp: 98.7 F (37.1 C) 99.5 F (37.5 C) 98.6 F (37 C) 98.2 F (36.8 C)  TempSrc: Oral Oral Oral Oral  SpO2:  99% 97% 98%  Weight:  (!) 147.3 kg    Height:  6\' 6"  (1.981 m)      Intake/Output Summary (Last 24 hours) at 12/10/2020 1552 Last data filed at 12/10/2020 0900 Gross per 24 hour  Intake 1718.86 ml  Output 650 ml  Net 1068.86 ml   Filed Weights   12/09/20 1837 12/09/20 1932 12/10/20 0448  Weight: (!) 154.7 kg (!) 154.7 kg (!) 147.3 kg    Examination:   General: Not in pain or dyspnea, deconditioned  Neurology: Awake and alert, non focal  E ENT: no pallor, no icterus, oral mucosa moist Cardiovascular: No JVD. S1-S2 present, rhythmic, no gallops, rubs, or murmurs. No lower extremity edema. Pulmonary: positive breath sounds bilaterally, with no wheezing, rhonchi or rales. Gastrointestinal. Abdomen soft and non tender Skin. No rashes Musculoskeletal: right foot with edema and erythema, dressing in place,.      Data Reviewed: I have personally reviewed following labs and imaging studies  CBC: Recent Labs  Lab 12/09/20 1711 12/10/20 0301  WBC 9.9 8.9  NEUTROABS 7.6  --   HGB 8.9* 8.0*  HCT 29.0* 26.2*  MCV 81.5 81.6  PLT 490* 414*   Basic Metabolic Panel: Recent Labs  Lab 12/09/20 1711 12/10/20 0047 12/10/20 0301  NA 129*  --  131*  K 4.0  --  3.9  CL 93*  --  97*  CO2 24  --  20*  GLUCOSE 185*  --  165*  BUN 21*  --  23*  CREATININE 1.33*  --  1.49*  CALCIUM 9.3  --  8.8*  MG  --  1.6*  --    GFR: Estimated Creatinine Clearance: 91.2 mL/min (A) (by C-G formula based on SCr of 1.49 mg/dL (H)). Liver Function Tests: Recent Labs  Lab 12/09/20 1711  AST 15  ALT 13  ALKPHOS 83  BILITOT 0.7  PROT 8.0  ALBUMIN 2.6*   No results for input(s): LIPASE, AMYLASE in the last 168 hours. No results for  input(s): AMMONIA in the last 168 hours. Coagulation Profile: Recent Labs  Lab 12/09/20 1711  INR 1.1   Cardiac Enzymes: No results for input(s): CKTOTAL, CKMB, CKMBINDEX, TROPONINI in the last 168 hours. BNP (last 3 results) No results for input(s): PROBNP in the last 8760 hours. HbA1C: No results for input(s): HGBA1C in the last 72 hours. CBG: Recent Labs  Lab 12/10/20 0327 12/10/20 0527 12/10/20 0808 12/10/20 1204 12/10/20 1537  GLUCAP 190* 171* 171* 161* 156*   Lipid Profile: No results for input(s): CHOL, HDL, LDLCALC, TRIG, CHOLHDL, LDLDIRECT in the last 72 hours. Thyroid Function Tests: No results for input(s): TSH, T4TOTAL, FREET4, T3FREE, THYROIDAB in the last 72 hours. Anemia Panel: Recent Labs    12/10/20 0301  VITAMINB12 415  FOLATE 8.4  FERRITIN 390*  TIBC 274  IRON 23*  RETICCTPCT 2.0      Radiology Studies: I have reviewed all of the imaging during this hospital visit personally     Scheduled Meds:  [START ON 12/11/2020] influenza vac split quadrivalent PF  0.5 mL Intramuscular Tomorrow-1000   insulin aspart  0-15 Units Subcutaneous Q4H   insulin glargine-yfgn  30 Units Subcutaneous BID   Continuous Infusions:  cefTRIAXone (ROCEPHIN)  IV Stopped (12/10/20 0020)   metronidazole 500 mg (12/10/20 0853)   vancomycin 1,250 mg (12/10/20 0842)     LOS: 1 day        Carlis Blanchard Gerome Apley, MD

## 2020-12-10 NOTE — Progress Notes (Addendum)
Initial Nutrition Assessment  DOCUMENTATION CODES:   Morbid obesity  INTERVENTION:  - Recommend as diet resumes: 1 packet Juven BID, each packet provides 95 calories, 2.5 grams of protein (collagen), and 9.8 grams of carbohydrate (3 grams sugar); also contains 7 grams of L-arginine and L-glutamine, 300 mg vitamin C, 15 mg vitamin E, 1.2 mcg vitamin B-12, 9.5 mg zinc, 200 mg calcium, and 1.5 g  Calcium Beta-hydroxy-Beta-methylbutyrate to support wound healing  - Recommend MVI with minerals daily  - D/t ongoing wounds, recommend vitamin c and zinc labs. Spoke with MD who agrees  NUTRITION DIAGNOSIS:   Increased nutrient needs related to wound healing as evidenced by estimated needs.   GOAL:   Patient will meet greater than or equal to 90% of their needs   MONITOR:   PO intake, Supplement acceptance, Diet advancement, Labs, Weight trends  REASON FOR ASSESSMENT:   Consult Wound healing  ASSESSMENT:   Pt admitted with worsening R foot infection. PMH includes T2DM, HTN, and L BKA.  Pt remains NPO pending surgical consultation and plans  Pt reports that he has had a nothing to eat for 2 days d/t lack of appetite. Prior to this he reports eating well. He states that he typically eats 2 meals a day consisting of a sandwich for lunch and baked spaghetti or a burger for dinner, however his intakes vary and sometimes he will only eat half of his meal and other times he will eat all of it. He takes mealtime insulin 3 times a day a home after his meals. Since July 2022, he has been using a Dexcom which has made him more aware of the effects foods have on his blood sugar. His blood sugars since starting the Dexcom have come down significantly and reports that his HgbA1c has come down from 9.2% to 6.8%. Spoke with pt about importance of balanced nutrition throughout the day to prevent hypoglycemic episodes as he states that he has had a few hypoglycemic events where he feels symptomatic <100  and does not like the way he feels.   He has had an ongoing wound for about 1 month. He reports a decrease in appetite d/t antibiotics causing yeast in his mouth which is currently resolved and not causing any problems with chewing/swallowing or appetite.  He states that he has been trying to lose weight since May for better diabetes management. He reports a weight of 383 lbs in May 2022 and a current weight of 341 lbs. Pt currently noted to weigh 324 lbs.   Spoke with pt about the importance of proper nutrition and adequate protein in diet to help with promotion of wound healing. He agreed to receiving Juven during admission to help with ongoing wounds.  Medications: SSI, semglee 30 units BID, IV abx   Labs: sodium 131, BUN 23, Cr 1.49, Mg 1.6, HgbA1c 6.8% CBG's: 161-204 x24 hours  NUTRITION - FOCUSED PHYSICAL EXAM:  Flowsheet Row Most Recent Value  Orbital Region No depletion  Upper Arm Region No depletion  Thoracic and Lumbar Region No depletion  Buccal Region No depletion  Temple Region No depletion  Clavicle Bone Region No depletion  Clavicle and Acromion Bone Region No depletion  Scapular Bone Region No depletion  Dorsal Hand No depletion  Patellar Region No depletion  Anterior Thigh Region No depletion  Posterior Calf Region No depletion  Edema (RD Assessment) Moderate  Hair Reviewed  Eyes Reviewed  Mouth Reviewed  Skin Reviewed  Nails Reviewed  Diet Order:   Diet Order             Diet Carb Modified Fluid consistency: Thin; Room service appropriate? Yes  Diet effective now                   EDUCATION NEEDS:   Education needs have been addressed  Skin:  Skin Assessment: Skin Integrity Issues: Skin Integrity Issues:: Stage II, Diabetic Ulcer Stage II: L BKA Diabetic Ulcer: R foot  Last BM:  12/09/20  Height:   Ht Readings from Last 1 Encounters:  12/10/20 6\' 6"  (1.981 m)    Weight:   Wt Readings from Last 1 Encounters:  12/10/20 (!)  147.3 kg    Ideal Body Weight:  90.9 kg (adjusted for L BKA)  BMI:  Body mass index is 37.53 kg/m.  Estimated Nutritional Needs:   Kcal:  2300-2500  Protein:  125-135g  Fluid:  >2.3L  12/12/20, RDN, LDN Clinical Nutrition

## 2020-12-11 ENCOUNTER — Ambulatory Visit: Payer: Medicare Other | Admitting: Internal Medicine

## 2020-12-11 DIAGNOSIS — L089 Local infection of the skin and subcutaneous tissue, unspecified: Secondary | ICD-10-CM

## 2020-12-11 DIAGNOSIS — E11628 Type 2 diabetes mellitus with other skin complications: Secondary | ICD-10-CM | POA: Diagnosis not present

## 2020-12-11 DIAGNOSIS — E11610 Type 2 diabetes mellitus with diabetic neuropathic arthropathy: Secondary | ICD-10-CM

## 2020-12-11 DIAGNOSIS — M86271 Subacute osteomyelitis, right ankle and foot: Secondary | ICD-10-CM

## 2020-12-11 DIAGNOSIS — E43 Unspecified severe protein-calorie malnutrition: Secondary | ICD-10-CM

## 2020-12-11 LAB — BASIC METABOLIC PANEL
Anion gap: 8 (ref 5–15)
BUN: 14 mg/dL (ref 6–20)
CO2: 25 mmol/L (ref 22–32)
Calcium: 9 mg/dL (ref 8.9–10.3)
Chloride: 101 mmol/L (ref 98–111)
Creatinine, Ser: 1.05 mg/dL (ref 0.61–1.24)
GFR, Estimated: >60 mL/min (ref >60)
Glucose, Bld: 153 mg/dL — ABNORMAL HIGH (ref 70–99)
Potassium: 4.1 mmol/L (ref 3.5–5.1)
Sodium: 134 mmol/L — ABNORMAL LOW (ref 135–145)

## 2020-12-11 LAB — CBC
HCT: 24.3 % — ABNORMAL LOW (ref 39.0–52.0)
Hemoglobin: 7.6 g/dL — ABNORMAL LOW (ref 13.0–17.0)
MCH: 25.2 pg — ABNORMAL LOW (ref 26.0–34.0)
MCHC: 31.3 g/dL (ref 30.0–36.0)
MCV: 80.5 fL (ref 80.0–100.0)
Platelets: 349 10*3/uL (ref 150–400)
RBC: 3.02 MIL/uL — ABNORMAL LOW (ref 4.22–5.81)
RDW: 15.9 % — ABNORMAL HIGH (ref 11.5–15.5)
WBC: 6.6 10*3/uL (ref 4.0–10.5)
nRBC: 0 % (ref 0.0–0.2)

## 2020-12-11 LAB — GLUCOSE, CAPILLARY
Glucose-Capillary: 159 mg/dL — ABNORMAL HIGH (ref 70–99)
Glucose-Capillary: 140 mg/dL — ABNORMAL HIGH (ref 70–99)
Glucose-Capillary: 136 mg/dL — ABNORMAL HIGH (ref 70–99)
Glucose-Capillary: 185 mg/dL — ABNORMAL HIGH (ref 70–99)
Glucose-Capillary: 153 mg/dL — ABNORMAL HIGH (ref 70–99)

## 2020-12-11 LAB — SEDIMENTATION RATE: Sed Rate: 112 mm/hr — ABNORMAL HIGH (ref 0–16)

## 2020-12-11 LAB — C-REACTIVE PROTEIN: CRP: 14.8 mg/dL — ABNORMAL HIGH (ref <1.0)

## 2020-12-11 MED ORDER — TRANEXAMIC ACID-NACL 1000-0.7 MG/100ML-% IV SOLN: 1000.0000 mg | INTRAVENOUS | Status: AC

## 2020-12-11 MED ORDER — CHLORHEXIDINE GLUCONATE 4 % EX LIQD
60.0000 mL | Freq: Once | CUTANEOUS | Status: AC
  Administered 2020-12-13: 4 via TOPICAL
  Filled 2020-12-11: qty 60

## 2020-12-11 MED ORDER — POVIDONE-IODINE 10 % EX SWAB: 2.0000 "application " | Freq: Once | CUTANEOUS | Status: DC

## 2020-12-11 MED ORDER — CLINDAMYCIN PHOSPHATE 900 MG/50ML IV SOLN
900.0000 mg | INTRAVENOUS | Status: DC
  Filled 2020-12-11: qty 50

## 2020-12-11 NOTE — Plan of Care (Signed)

## 2020-12-11 NOTE — Progress Notes (Signed)
PROGRESS NOTE    Kenneth Cohen  [id number] DOB: August 20, 1965 DOA: 12/09/2020 PCP: Pleas Koch, NP    Brief Narrative:  55 yo male with the past medical history of T2DM, HTN, left BKA and right foot wound who presented with right foot infection. Patient had right foot edema, erythema and tenderness for about 3 to 4 weeks, he was seen at a wound center and by her PCP who referred him to a podiatrist, subsequently had an MRI done on 11/14 was concerning for abscesses, septic joint and osteomyelitis, referred to the emergency room  -Presented to the ED 11/21  -Started on broad-spectrum antibiotics, seen by vascular surgery, recommended orthopedics evaluation   Assessment & Plan:   Right diabetic foot abscesses, septic joint and osteomyelitis -MRI completed prior to admission on 11/14 reviewed -Appreciate vascular surgery consultation yesterday, recommended orthopedics eval -Continue broad-spectrum antibiotics -Orthopedics consulted today, anticipate need for BKA -ABIs were normal  AKI on CKD stage 2,  Hypovolemic hyponatremia.  -Resolved with hydration and supportive care  T2DM/ dyslipidemia.  -CBG stable on current regimen  -Follow-up hemoglobin A1c  HTN prolonged QTc  -BP stable, amlodipine HCTZ and olmesartan on hold.  Obesity -BMI 37.5   DVT prophylaxis: Enoxaparin   Code Status:    full  Family Communication:  Discussed with patient in detail, no family at bedside   Consultants:  Vascular surgery    Antimicrobials:  Vancomycin and ceftriaxone     Subjective: -Continues to report pain and discomfort in his foot,  Objective: Vitals:   12/10/20 0700 12/10/20 1405 12/10/20 2020 12/11/20 0816  BP: 106/62 113/69 105/64 112/72  Pulse: 92 91 92 96  Resp: 18 17 16 20   Temp: 98.6 F (37 C) 98.2 F (36.8 C) 98.8 F (37.1 C) 98 F (36.7 C)  TempSrc: Oral Oral Oral Oral  SpO2: 97% 98% 100% 100%  Weight:      Height:        Intake/Output Summary  (Last 24 hours) at 12/11/2020 1108 Last data filed at 12/11/2020 1010 Gross per 24 hour  Intake 350 ml  Output 1300 ml  Net -950 ml   Filed Weights   12/09/20 1837 12/09/20 1932 12/10/20 0448  Weight: (!) 154.7 kg (!) 154.7 kg (!) 147.3 kg    Examination:   General: Gen: Awake, Alert, Oriented X 3,  HEENT: no JVD Lungs: Good air movement bilaterally, CTAB CVS: S1S2/RRR Abd: soft, Non tender, non distended, BS present Extremities: Right foot with Charcot deformity, swelling tenderness and open wounds laterally with drainage, left BKA, Psych: Flat affect   Data Reviewed: I have personally reviewed following labs and imaging studies  CBC: Recent Labs  Lab 12/09/20 1711 12/10/20 0301 12/11/20 0158  WBC 9.9 8.9 6.6  NEUTROABS 7.6  --   --   HGB 8.9* 8.0* 7.6*  HCT 29.0* 26.2* 24.3*  MCV 81.5 81.6 80.5  PLT 490* 414* 0000000   Basic Metabolic Panel: Recent Labs  Lab 12/09/20 1711 12/10/20 0047 12/10/20 0301 12/11/20 0158  NA 129*  --  131* 134*  K 4.0  --  3.9 4.1  CL 93*  --  97* 101  CO2 24  --  20* 25  GLUCOSE 185*  --  165* 153*  BUN 21*  --  23* 14  CREATININE 1.33*  --  1.49* 1.05  CALCIUM 9.3  --  8.8* 9.0  MG  --  1.6*  --   --    GFR: Estimated Creatinine  Clearance: 129.5 mL/min (by C-G formula based on SCr of 1.05 mg/dL). Liver Function Tests: Recent Labs  Lab 12/09/20 1711  AST 15  ALT 13  ALKPHOS 83  BILITOT 0.7  PROT 8.0  ALBUMIN 2.6*   No results for input(s): LIPASE, AMYLASE in the last 168 hours. No results for input(s): AMMONIA in the last 168 hours. Coagulation Profile: Recent Labs  Lab 12/09/20 1711  INR 1.1   Cardiac Enzymes: No results for input(s): CKTOTAL, CKMB, CKMBINDEX, TROPONINI in the last 168 hours. BNP (last 3 results) No results for input(s): PROBNP in the last 8760 hours. HbA1C: No results for input(s): HGBA1C in the last 72 hours. CBG: Recent Labs  Lab 12/10/20 1537 12/10/20 2023 12/10/20 2344 12/11/20 0436  12/11/20 0815  GLUCAP 156* 159* 141* 159* 140*   Lipid Profile: No results for input(s): CHOL, HDL, LDLCALC, TRIG, CHOLHDL, LDLDIRECT in the last 72 hours. Thyroid Function Tests: No results for input(s): TSH, T4TOTAL, FREET4, T3FREE, THYROIDAB in the last 72 hours. Anemia Panel: Recent Labs    12/10/20 0301  VITAMINB12 415  FOLATE 8.4  FERRITIN 390*  TIBC 274  IRON 23*  RETICCTPCT 2.0    Radiology Studies: I have reviewed all of the imaging during this hospital visit personally  Scheduled Meds:  atorvastatin  20 mg Oral Daily   influenza vac split quadrivalent PF  0.5 mL Intramuscular Tomorrow-1000   insulin aspart  0-15 Units Subcutaneous Q4H   insulin glargine-yfgn  30 Units Subcutaneous BID   metFORMIN  1,000 mg Oral BID WC   Continuous Infusions:  cefTRIAXone (ROCEPHIN)  IV 2 g (12/10/20 2251)   vancomycin 1,250 mg (12/11/20 1045)     LOS: 2 days        Zannie Cove, MD

## 2020-12-11 NOTE — Consult Note (Signed)
Reason for Consult:Right foot ulcer Referring Physician: Zannie CovePreetha Joseph Time called: 1048 Time at bedside: [id number]   Kenneth RenderRonald S Cohen is an 55 y.o. male.  HPI: Kenneth FastRonald Cohen in with ongoing issues with Charcot foot on the right. He suffered a similar fate with the other foot that required amputation. He would like another amputation now as he's tired of dealing with it. He does not work.  Past Medical History:  Diagnosis Date   Diabetic Charcot foot (HCC)    Hypertension    Obesity    Testicular torsion    as a child    History reviewed. No pertinent surgical history.  Family History  Problem Relation Age of Onset   Hypertension Mother    Diabetes Mother    Diabetes Father     Social History:  reports that he quit smoking about 16 years ago. He has never used smokeless tobacco. He reports that he does not drink alcohol and does not use drugs.  Allergies:  Allergies  Allergen Reactions   Cefaclor Itching, Swelling and Other (See Comments)    Reaction:  All-over body swelling   Did receive and tolerate cefazolin on adm 06/2016    Iodinated Diagnostic Agents Hives, Itching and Other (See Comments)    Immediately after receiving the iv contrast, patient started itching and developed welts on his forehead, back of head, chest and back.  ER Doctor and RN were notified and patient was told he should always be premedicated prior to receiving IV contrast.    Medications: I have reviewed the patient's current medications.  Results for orders placed or performed during the hospital encounter of 12/09/20 (from the past 48 hour(s))  CBG monitoring, ED     Status: Abnormal   Collection Time: 12/09/20  5:06 PM  Result Value Ref Range   Glucose-Capillary 158 (H) 70 - 99 mg/dL    Comment: Glucose reference range applies only to samples taken after fasting for at least 8 hours.   Comment 1 Notify RN    Comment 2 Document in Chart   Lactic acid, plasma     Status: None   Collection Time:  12/09/20  5:11 PM  Result Value Ref Range   Lactic Acid, Venous 1.8 0.5 - 1.9 mmol/L    Comment: Performed at [facility], [address] [place], [place] [id number]  Comprehensive metabolic panel     Status: Abnormal   Collection Time: 12/09/20  5:11 PM  Result Value Ref Range   Sodium 129 (L) 135 - 145 mmol/L   Potassium 4.0 3.5 - 5.1 mmol/L   Chloride 93 (L) 98 - 111 mmol/L   CO2 24 22 - 32 mmol/L   Glucose, Bld 185 (H) 70 - 99 mg/dL    Comment: Glucose reference range applies only to samples taken after fasting for at least 8 hours.   BUN 21 (H) 6 - 20 mg/dL   Creatinine, Ser 6.041.33 (H) 0.61 - 1.24 mg/dL   Calcium 9.3 8.9 - 54.010.3 mg/dL   Total Protein 8.0 6.5 - 8.1 g/dL   Albumin 2.6 (L) 3.5 - 5.0 g/dL   AST 15 15 - 41 U/L   ALT 13 0 - 44 U/L   Alkaline Phosphatase 83 38 - 126 U/L   Total Bilirubin 0.7 0.3 - 1.2 mg/dL   GFR, Estimated >98>60 >11>60 mL/min    Comment: (NOTE) Calculated using the CKD-EPI Creatinine Equation (2021)    Anion gap 12 5 - 15  Comment: Performed at [facility], [place] [address], [place], [place] [address]  CBC WITH DIFFERENTIAL     Status: Abnormal   Collection Time: 12/09/20  5:11 PM  Result Value Ref Range   WBC 9.9 4.0 - 10.5 K/uL   RBC 3.56 (L) 4.22 - 5.81 MIL/uL   Hemoglobin 8.9 (L) 13.0 - 17.0 g/dL   HCT 29.0 (L) 39.0 - 52.0 %   MCV 81.5 80.0 - 100.0 fL   MCH 25.0 (L) 26.0 - 34.0 pg   MCHC 30.7 30.0 - 36.0 g/dL   RDW 15.8 (H) 11.5 - 15.5 %   Platelets 490 (H) 150 - 400 K/uL   nRBC 0.0 0.0 - 0.2 %   Neutrophils Relative % 77 %   Neutro Abs 7.6 1.7 - 7.7 K/uL   Lymphocytes Relative 15 %   Lymphs Abs 1.5 0.7 - 4.0 K/uL   Monocytes Relative 5 %   Monocytes Absolute 0.5 0.1 - 1.0 K/uL   Eosinophils Relative 2 %   Eosinophils Absolute 0.2 0.0 - 0.5 K/uL   Basophils Relative 1 %   Basophils Absolute 0.1 0.0 - 0.1 K/uL   Immature Granulocytes 0 %   Abs Immature Granulocytes 0.04 0.00 - 0.07 K/uL    Comment: Performed at [facility], [address], [place], [place] [address]  Protime-INR     Status: None   Collection Time: 12/09/20  5:11 PM  Result Value Ref Range   Prothrombin Time 14.5 11.4 - 15.2 seconds   INR 1.1 0.8 - 1.2    Comment: (NOTE) INR goal varies based on device and disease states. Performed at [facility], [place] [address], [place], [place] [address]   APTT     Status: None   Collection Time: 12/09/20  5:11 PM  Result Value Ref Range   aPTT 35 24 - 36 seconds    Comment: Performed at [facility] [address] [place], [place] [address]  Urinalysis, Routine w reflex microscopic Urine, Clean Catch     Status: Abnormal   Collection Time: 12/09/20  5:11 PM  Result Value Ref Range   Color, Urine YELLOW YELLOW   APPearance HAZY (A) CLEAR   Specific Gravity, Urine 1.017 1.005 - 1.030   pH 5.0 5.0 - 8.0   Glucose, UA NEGATIVE NEGATIVE mg/dL   Hgb urine dipstick SMALL (A) NEGATIVE   Bilirubin Urine NEGATIVE NEGATIVE   Ketones, ur NEGATIVE NEGATIVE mg/dL   Protein, ur NEGATIVE NEGATIVE mg/dL   Nitrite NEGATIVE NEGATIVE   Leukocytes,Ua NEGATIVE NEGATIVE   RBC / HPF 0-5 0 - 5 RBC/hpf   WBC, UA 0-5 0 - 5 WBC/hpf   Bacteria, UA NONE SEEN NONE SEEN   Squamous Epithelial / LPF 0-5 0 - 5   Mucus PRESENT    Hyaline Casts, UA PRESENT     Comment: Performed at [facility], [place] [address] [place], [place] [address]  Blood Culture (routine x 2)     Status: None (Preliminary result)   Collection Time: 12/09/20  5:19 PM   Specimen: BLOOD  Result Value Ref Range   Specimen Description BLOOD RIGHT ANTECUBITAL    Special Requests      BOTTLES DRAWN AEROBIC ONLY Blood Culture adequate volume   Culture      NO GROWTH 2 DAYS Performed at [facility], [place] [address] [place], [place] [address]    Report Status PENDING  Blood Culture (routine x 2)     Status: None (Preliminary result)   Collection Time: 12/09/20  5:19 PM   Specimen: BLOOD  Result Value Ref Range    Specimen Description BLOOD RIGHT ANTECUBITAL    Special Requests      BOTTLES DRAWN AEROBIC AND ANAEROBIC Blood Culture adequate volume   Culture      NO GROWTH 2 DAYS Performed at [facility], [place] [address], [place], [place] [address]    Report Status PENDING   CBG monitoring, ED     Status: Abnormal   Collection Time: 12/09/20  6:38 PM  Result Value Ref Range   Glucose-Capillary 194 (H) 70 - 99 mg/dL    Comment: Glucose reference range applies only to samples taken after fasting for at least 8 hours.  CBG monitoring, ED     Status: Abnormal   Collection Time: 12/09/20  8:20 PM  Result Value Ref Range   Glucose-Capillary 204 (H) 70 - 99 mg/dL    Comment: Glucose reference range applies only to samples taken after fasting for at least 8 hours.  CBG monitoring, ED     Status: Abnormal   Collection Time: 12/10/20 12:01 AM  Result Value Ref Range   Glucose-Capillary 166 (H) 70 - 99 mg/dL    Comment: Glucose reference range applies only to samples taken after fasting for at least 8 hours.  Lactic acid, plasma     Status: None   Collection Time: 12/10/20 12:47 AM  Result Value Ref Range   Lactic Acid, Venous 1.2 0.5 - 1.9 mmol/L    Comment: Performed at [facility] [address] [place], [place] [address]  Sedimentation rate     Status: Abnormal   Collection Time: 12/10/20 12:47 AM  Result Value Ref Range   Sed Rate 81 (H) 0 - 16 mm/hr    Comment: Performed at [facility] [address], [place], [place] [address]  C-reactive protein     Status: Abnormal   Collection Time: 12/10/20 12:47 AM  Result Value Ref Range   CRP 11.0 (H) <1.0 mg/dL    Comment: Performed at [facility] [address] [place], [place] [address]  Prealbumin     Status: Abnormal   Collection Time: 12/10/20 12:47 AM  Result Value Ref Range   Prealbumin 8.3 (L) 18 - 38 mg/dL    Comment: Performed at [facility] [address], [place], [place] [address]  Magnesium      Status: Abnormal   Collection Time: 12/10/20 12:47 AM  Result Value Ref Range   Magnesium 1.6 (L) 1.7 - 2.4 mg/dL    Comment: Performed at [facility] [address] [place], [place] [address]  Sedimentation rate     Status: Abnormal   Collection Time: 12/10/20  3:01 AM  Result Value Ref Range   Sed Rate 113 (H) 0 - 16 mm/hr    Comment: Performed at [facility] [address] [place], [place] [address]  C-reactive protein     Status: Abnormal   Collection Time: 12/10/20  3:01 AM  Result Value Ref Range   CRP 13.0 (H) <1.0 mg/dL    Comment: Performed at [facility] [address], [place],  [id number]  Basic metabolic panel     Status: Abnormal   Collection Time: 12/10/20  3:01 AM  Result Value Ref Range   Sodium 131 (L) 135 -  145 mmol/L   Potassium 3.9 3.5 - 5.1 mmol/L   Chloride 97 (L) 98 - 111 mmol/L   CO2 20 (L) 22 - 32 mmol/L   Glucose, Bld 165 (H) 70 - 99 mg/dL    Comment: Glucose reference range applies only to samples taken after fasting for at least 8 hours.   BUN 23 (H) 6 - 20 mg/dL   Creatinine, Ser 7.16 (H) 0.61 - 1.24 mg/dL   Calcium 8.8 (L) 8.9 - 10.3 mg/dL   GFR, Estimated 55 (L) >60 mL/min    Comment: (NOTE) Calculated using the CKD-EPI Creatinine Equation (2021)    Anion gap 14 5 - 15    Comment: Performed at [facility], [address], [place], [place] [address]  CBC     Status: Abnormal   Collection Time: 12/10/20  3:01 AM  Result Value Ref Range   WBC 8.9 4.0 - 10.5 K/uL   RBC 3.21 (L) 4.22 - 5.81 MIL/uL   Hemoglobin 8.0 (L) 13.0 - 17.0 g/dL   HCT 38.1 (L) 01.7 - 51.0 %   MCV 81.6 80.0 - 100.0 fL   MCH 24.9 (L) 26.0 - 34.0 pg   MCHC 30.5 30.0 - 36.0 g/dL   RDW 25.8 (H) 52.7 - 78.2 %   Platelets 414 (H) 150 - 400 K/uL   nRBC 0.0 0.0 - 0.2 %    Comment: Performed at [facility], [address] [place], [place] [address]  Vitamin B12     Status: None   Collection Time: 12/10/20  3:01 AM  Result Value Ref  Range   Vitamin B-12 415 180 - 914 pg/mL    Comment: (NOTE) This assay is not validated for testing neonatal or myeloproliferative syndrome specimens for Vitamin B12 levels. Performed at [facility], [address], [place], [place] [address]   Folate     Status: None   Collection Time: 12/10/20  3:01 AM  Result Value Ref Range   Folate 8.4 >5.9 ng/mL    Comment: Performed at [facility], [address], [place], [place] [address]  Iron and TIBC     Status: Abnormal   Collection Time: 12/10/20  3:01 AM  Result Value Ref Range   Iron 23 (L) 45 - 182 ug/dL   TIBC 867 619 - 509 ug/dL   Saturation Ratios 8 (L) 17.9 - 39.5 %   UIBC 251 ug/dL    Comment: Performed at [facility], [address] [place], [place] [address]  Ferritin     Status: Abnormal   Collection Time: 12/10/20  3:01 AM  Result Value Ref Range   Ferritin 390 (H) 24 - 336 ng/mL    Comment: Performed at [facility], [address], [place], [place] [address]  Reticulocytes     Status: Abnormal   Collection Time: 12/10/20  3:01 AM  Result Value Ref Range   Retic Ct Pct 2.0 0.4 - 3.1 %   RBC. 3.27 (L) 4.22 - 5.81 MIL/uL   Retic Count, Absolute 64.4 19.0 - 186.0 K/uL   Immature Retic Fract 22.1 (H) 2.3 - 15.9 %    Comment: Performed at [facility], [address], [place], [place] [address]  Resp Panel by RT-PCR (Flu A&B, Covid) Nasopharyngeal Swab     Status: None   Collection Time: 12/10/20  3:22 AM   Specimen: Nasopharyngeal Swab; Nasopharyngeal(NP) swabs in vial transport medium  Result  Value Ref Range   SARS Coronavirus 2 by RT PCR NEGATIVE NEGATIVE    Comment: (NOTE) SARS-CoV-2 target nucleic acids are NOT DETECTED.  The SARS-CoV-2 RNA is generally detectable in upper respiratory specimens during the acute phase of infection. The lowest concentration of SARS-CoV-2 viral copies this assay can detect is 138 copies/mL. A negative result does not preclude SARS-Cov-2 infection  and should not be used as the sole basis for treatment or other patient management decisions. A negative result may occur with  improper specimen collection/handling, submission of specimen other than nasopharyngeal swab, presence of viral mutation(s) within the areas targeted by this assay, and inadequate number of viral copies(<138 copies/mL). A negative result must be combined with clinical observations, patient history, and epidemiological information. The expected result is Negative.  Fact Sheet for Patients:  BloggerCourse.com  Fact Sheet for Healthcare Providers:  SeriousBroker.it  This test is no t yet approved or cleared by the [place] FDA and  has been authorized for detection and/or diagnosis of SARS-CoV-2 by FDA under an Emergency Use Authorization (EUA). This EUA will remain  in effect (meaning this test can be used) for the duration of the COVID-19 declaration under Section 564(b)(1) of the Act, 21 U.S.C.section 360bbb-3(b)(1), unless the authorization is terminated  or revoked sooner.       Influenza A by PCR NEGATIVE NEGATIVE   Influenza B by PCR NEGATIVE NEGATIVE    Comment: (NOTE) The Xpert Xpress SARS-CoV-2/FLU/RSV plus assay is intended as an aid in the diagnosis of influenza from Nasopharyngeal swab specimens and should not be used as a sole basis for treatment. Nasal washings and aspirates are unacceptable for Xpert Xpress SARS-CoV-2/FLU/RSV testing.  Fact Sheet for Patients: BloggerCourse.com  Fact Sheet for Healthcare Providers: SeriousBroker.it  This test is not yet approved or cleared by the [place] FDA and has been authorized for detection and/or diagnosis of SARS-CoV-2 by FDA under an Emergency Use Authorization (EUA). This EUA will remain in effect (meaning this test can be used) for the duration of the COVID-19 declaration under  Section 564(b)(1) of the Act, 21 U.S.C. section 360bbb-3(b)(1), unless the authorization is terminated or revoked.  Performed at [facility], [address], [place], [place] [address]   CBG monitoring, ED     Status: Abnormal   Collection Time: 12/10/20  3:27 AM  Result Value Ref Range   Glucose-Capillary 190 (H) 70 - 99 mg/dL    Comment: Glucose reference range applies only to samples taken after fasting for at least 8 hours.  Glucose, capillary     Status: Abnormal   Collection Time: 12/10/20  5:27 AM  Result Value Ref Range   Glucose-Capillary 171 (H) 70 - 99 mg/dL    Comment: Glucose reference range applies only to samples taken after fasting for at least 8 hours.  Glucose, capillary     Status: Abnormal   Collection Time: 12/10/20  8:08 AM  Result Value Ref Range   Glucose-Capillary 171 (H) 70 - 99 mg/dL    Comment: Glucose reference range applies only to samples taken after fasting for at least 8 hours.  Glucose, capillary     Status: Abnormal   Collection Time: 12/10/20 12:04 PM  Result Value Ref Range   Glucose-Capillary 161 (H) 70 - 99 mg/dL    Comment: Glucose reference range applies only to samples taken after fasting for at least 8 hours.  Glucose, capillary     Status: Abnormal   Collection Time: 12/10/20  3:37 PM  Result Value Ref Range   Glucose-Capillary 156 (H) 70 - 99 mg/dL    Comment: Glucose reference range applies only to samples taken after fasting for at least 8 hours.  Glucose, capillary     Status: Abnormal   Collection Time: 12/10/20  8:23 PM  Result Value Ref Range   Glucose-Capillary 159 (H) 70 - 99 mg/dL    Comment: Glucose reference range applies only to samples taken after fasting for at least 8 hours.  Glucose, capillary     Status: Abnormal   Collection Time: 12/10/20 11:44 PM  Result Value Ref Range   Glucose-Capillary 141 (H) 70 - 99 mg/dL    Comment: Glucose reference range applies only to samples taken after fasting for at least 8  hours.  Sedimentation rate     Status: Abnormal   Collection Time: 12/11/20  1:58 AM  Result Value Ref Range   Sed Rate 112 (H) 0 - 16 mm/hr    Comment: Performed at [facility] [address] [place], [place] [address]  C-reactive protein     Status: Abnormal   Collection Time: 12/11/20  1:58 AM  Result Value Ref Range   CRP 14.8 (H) <1.0 mg/dL    Comment: Performed at [facility] [address] [place], [place] [id number]  Basic metabolic panel     Status: Abnormal   Collection Time: 12/11/20  1:58 AM  Result Value Ref Range   Sodium 134 (L) 135 - 145 mmol/L   Potassium 4.1 3.5 - 5.1 mmol/L   Chloride 101 98 - 111 mmol/L   CO2 25 22 - 32 mmol/L   Glucose, Bld 153 (H) 70 - 99 mg/dL    Comment: Glucose reference range applies only to samples taken after fasting for at least 8 hours.   BUN 14 6 - 20 mg/dL   Creatinine, Ser 1.05 0.61 - 1.24 mg/dL   Calcium 9.0 8.9 - 10.3 mg/dL   GFR, Estimated >60 >60 mL/min    Comment: (NOTE) Calculated using the CKD-EPI Creatinine Equation (2021)    Anion gap 8 5 - 15    Comment: Performed at [facility] [address], [place], [place] [address]  CBC     Status: Abnormal   Collection Time: 12/11/20  1:58 AM  Result Value Ref Range   WBC 6.6 4.0 - 10.5 K/uL   RBC 3.02 (L) 4.22 - 5.81 MIL/uL   Hemoglobin 7.6 (L) 13.0 - 17.0 g/dL   HCT 24.3 (L) 39.0 - 52.0 %   MCV 80.5 80.0 - 100.0 fL   MCH 25.2 (L) 26.0 - 34.0 pg   MCHC 31.3 30.0 - 36.0 g/dL   RDW 15.9 (H) 11.5 - 15.5 %   Platelets 349 150 - 400 K/uL   nRBC 0.0 0.0 - 0.2 %    Comment: Performed at [facility], [place] [address] [place], [place] [address]  Glucose, capillary     Status: Abnormal   Collection Time: 12/11/20  4:36 AM  Result Value Ref Range   Glucose-Capillary 159 (H) 70 - 99 mg/dL    Comment: Glucose reference range applies only to samples taken after fasting for at least 8 hours.  Glucose, capillary     Status: Abnormal   Collection Time:  12/11/20  8:15 AM  Result Value Ref Range   Glucose-Capillary 140 (H) 70 - 99 mg/dL    Comment: Glucose reference range applies only to  samples taken after fasting for at least 8 hours.  Glucose, capillary     Status: Abnormal   Collection Time: 12/11/20 11:13 AM  Result Value Ref Range   Glucose-Capillary 136 (H) 70 - 99 mg/dL    Comment: Glucose reference range applies only to samples taken after fasting for at least 8 hours.    DG Chest 2 View  Result Date: 12/09/2020 CLINICAL DATA:  Weakness EXAM: CHEST - 2 VIEW COMPARISON:  None. FINDINGS: The heart size and mediastinal contours are within normal limits. Both lungs are clear. The visualized skeletal structures are unremarkable. IMPRESSION: No active cardiopulmonary disease. Electronically Signed   By: Donavan Foil M.D.   On: 12/09/2020 18:09   DG Foot Complete Right  Result Date: 12/09/2020 CLINICAL DATA:  Weakness.  Diabetes.  Foot infection. EXAM: RIGHT FOOT COMPLETE - 3+ VIEW COMPARISON:  [facility] 12/09/2020 at 3:30 p.m., also MRI of the foot 12/02/2020 FINDINGS: Severe bony destructive findings involving the navicular and cuneiform bones with fragmentation and indistinctness of the structures. Mild to moderate demineralization and destructive findings in the cuboid. Substantial erosions and indistinct destructive findings involving the bases of the second through fifth metatarsals with extensive sclerosis and periostitis especially in the fourth and fifth metatarsals compatible with osteomyelitis. Large erosions involving the head of the fifth metatarsal and the base of the proximal phalanx fifth toe. Speckled calcifications in the soft tissues. Severe dorsal soft tissue swelling potentially with tiny locules of gas in the soft tissues. Notable soft tissue swelling above the talar head. Plantar and Achilles calcaneal spurs. IMPRESSION: 1. Extensive bony destructive findings in the midfoot and Lisfranc  joint, as well as along the fifth MTP joint, likely mostly from osteomyelitis and multiple confluent septic joints, although a component of Charcot arthropathy is not excluded. 2. Marked soft tissue swelling along the dorsum of the foot, similar findings on the MRI from 12/02/2020 were due to abscess and subcutaneous edema. There is potentially some minimal gas loculation along the dorsal soft tissues of the foot. Electronically Signed   By: Van Clines M.D.   On: 12/09/2020 18:12   VAS [place] ABI WITH/WO TBI  Result Date: 12/10/2020  LOWER EXTREMITY DOPPLER STUDY Patient Name:  Kenneth Cohen  Date of Exam:   12/10/2020 Medical Rec #: [id number]        Accession #:    [id number] Date of Birth: [birth year]        Patient Gender: M Patient Age:   62 years Exam Location[facility]  [facility] Procedure:      VAS [place] ABI WITH/WO TBI Referring Phys: TIMOTHY OPYD --------------------------------------------------------------------------------  Indications: Ulceration, and left BKA. High Risk Factors: Hypertension, Diabetes.  Limitations: Today's exam was limited due to edema. Comparison Study: No prior study Performing Technologist: Maudry Mayhew MHA, RVT, RDCS, RDMS  Examination Guidelines: A complete evaluation includes at minimum, Doppler waveform signals and systolic blood pressure reading at the level of bilateral brachial, anterior tibial, and posterior tibial arteries, when vessel segments are accessible. Bilateral testing is considered an integral part of a complete examination. Photoelectric Plethysmograph (PPG) waveforms and toe systolic pressure readings are included as required and additional duplex testing as needed. Limited examinations for reoccurring indications may be performed as noted.  ABI Findings: +---------+------------------+-----+---------+--------+ Right    Rt Pressure (mmHg)IndexWaveform Comment  +---------+------------------+-----+---------+--------+ Brachial 127  triphasic         +---------+------------------+-----+---------+--------+ PTA      132               1.04 triphasic         +---------+------------------+-----+---------+--------+ DP       148               1.17 biphasic          +---------+------------------+-----+---------+--------+ Great Toe89                0.70                   +---------+------------------+-----+---------+--------+ +---------+------------------+-----+---------+-------+ Left     Lt Pressure (mmHg)IndexWaveform Comment +---------+------------------+-----+---------+-------+ Brachial 127                    triphasic        +---------+------------------+-----+---------+-------+ PTA                                      BKA     +---------+------------------+-----+---------+-------+ DP                                       BKA     +---------+------------------+-----+---------+-------+ Great Toe                                BKA     +---------+------------------+-----+---------+-------+ +-------+-----------+-----------+------------+------------+ ABI/TBIToday's ABIToday's TBIPrevious ABIPrevious TBI +-------+-----------+-----------+------------+------------+ Right  1.17       0.70                                +-------+-----------+-----------+------------+------------+ Left   BKA        BKA                                 +-------+-----------+-----------+------------+------------+  Summary: Right: Resting right ankle-brachial index is within normal range. No evidence of significant right lower extremity arterial disease. The right toe-brachial index is normal.  *See table(s) above for measurements and observations.  Electronically signed by Deitra Mayo MD on 12/10/2020 at 12:15:12 PM.    Final     Review of Systems  Constitutional:  Negative for chills, diaphoresis and fever.  HENT:  Negative for ear discharge, ear pain, hearing loss and tinnitus.   Eyes:   Negative for photophobia and pain.  Respiratory:  Negative for cough and shortness of breath.   Cardiovascular:  Negative for chest pain.  Gastrointestinal:  Negative for abdominal pain, nausea and vomiting.  Genitourinary:  Negative for dysuria, flank pain, frequency and urgency.  Musculoskeletal:  Positive for arthralgias (Right foot). Negative for back pain, myalgias and neck pain.  Neurological:  Negative for dizziness and headaches.  Hematological:  Does not bruise/bleed easily.  Psychiatric/Behavioral:  The patient is not nervous/anxious.   Blood pressure 112/72, pulse 96, temperature 98 F (36.7 C), temperature source Oral, resp. rate 20, height 6\' 6"  (1.981 m), weight (!) 147.3 kg, SpO2 100 %. Physical Exam Constitutional:      General: He is not in acute distress.    Appearance: He is well-developed. He is not diaphoretic.  HENT:     Head: Normocephalic and atraumatic.  Eyes:     General: No scleral icterus.       Right eye: No discharge.        Left eye: No discharge.     Conjunctiva/sclera: Conjunctivae normal.  Cardiovascular:     Rate and Rhythm: Normal rate and regular rhythm.  Pulmonary:     Effort: Pulmonary effort is normal. No respiratory distress.  Musculoskeletal:     Cervical back: Normal range of motion.  Feet:     Comments: Right foot: Ulceration over 5th MTP joint, 4+ NP edema foot, 1+ DP, 0 PT, SPN/DPN/TN paresthetic. Skin:    General: Skin is warm and dry.  Neurological:     Mental Status: He is alert.  Psychiatric:        Mood and Affect: Mood normal.        Behavior: Behavior normal.    Assessment/Plan: Right Charcot foot -- Dr. Sharol Given to evaluate later today, suspect will recommend BKA.    Lisette Abu, PA-C Orthopedic Surgery [phone] 12/11/2020, 12:43 PM

## 2020-12-11 NOTE — Care Management Important Message (Signed)
Important Message  Patient Details  Name: AVON MERGENTHALER MRN: [id number] Date of Birth: [birth year]   Medicare Important Message Given:  Yes     Wadie Lessen 12/11/2020, 10:59 AM

## 2020-12-11 NOTE — Consult Note (Signed)
 ORTHOPAEDIC CONSULTATION  REQUESTING PHYSICIAN: Joseph, Preetha, MD  Chief Complaint: Abscess osteomyelitis Charcot right foot.  HPI: Kenneth Cohen is a 55 y.o. male who presents with diabetic insensate neuropathy and a Charcot foot on the right with abscess osteomyelitis.  Patient is status post left transtibial amputation he has been subsiding in the socket and developing an bearing ulceration.  Past Medical History:  Diagnosis Date   Diabetic Charcot foot (HCC)    Hypertension    Obesity    Testicular torsion    as a child   History reviewed. No pertinent surgical history. Social History   Socioeconomic History   Marital status: Married    Spouse name: Not on file   Number of children: Not on file   Years of education: Not on file   Highest education level: Not on file  Occupational History   Not on file  Tobacco Use   Smoking status: Former    Types: Cigarettes    Quit date: 01/20/2004    Years since quitting: 16.9   Smokeless tobacco: Never  Substance and Sexual Activity   Alcohol use: No    Alcohol/week: 0.0 standard drinks    Comment: rarely   Drug use: No   Sexual activity: Not on file  Other Topics Concern   Not on file  Social History Narrative   Married.   One son, junior in High School.   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   Enjoys playing on the computer.   Social Determinants of Health   Financial Resource Strain: Not on file  Food Insecurity: Not on file  Transportation Needs: Not on file  Physical Activity: Not on file  Stress: Not on file  Social Connections: Not on file   Family History  Problem Relation Age of Onset   Hypertension Mother    Diabetes Mother    Diabetes Father    - negative except otherwise stated in the family history section Allergies  Allergen Reactions   Cefaclor Itching, Swelling and Other (See Comments)    Reaction:  All-over body swelling   Did receive and tolerate cefazolin on adm 06/2016     Iodinated Diagnostic Agents Hives, Itching and Other (See Comments)    Immediately after receiving the iv contrast, patient started itching and developed welts on his forehead, back of head, chest and back.  ER Doctor and RN were notified and patient was told he should always be premedicated prior to receiving IV contrast.   Prior to Admission medications   Medication Sig Start Date End Date Taking? Authorizing Provider  amLODipine (NORVASC) 10 MG tablet Take 1 tablet (10 mg total) by mouth daily. For blood pressure. 04/30/20  Yes Clark, Katherine K, NP  Aspirin-Salicylamide-Caffeine (BC HEADACHE POWDER PO) Take 1 packet by mouth daily as needed (for headaches).   Yes [provider]  atorvastatin (LIPITOR) 20 MG tablet Take 1 tablet (20 mg total) by mouth daily. For cholesterol. 04/30/20  Yes Clark, Katherine K, NP  clindamycin (CLEOCIN) 300 MG capsule Take 300 mg by mouth every 6 (six) hours. 12/04/20 12/11/20 Yes [provider]  Continuous Blood Gluc Sensor (DEXCOM G6 SENSOR) MISC 1 each by Does not apply route as directed. Patient taking differently: Inject 1 Device into the skin every 14 (fourteen) days. 04/30/20  Yes Clark, Katherine K, NP  fluticasone (FLONASE) 50 MCG/ACT nasal spray Place 2 sprays into both nostrils daily. Patient taking differently: Place 2 sprays into both nostrils daily as needed   for allergies or rhinitis. 10/20/20  Yes Claiborne Rigg, NP  hydrochlorothiazide (HYDRODIURIL) 25 MG tablet Take 1 tablet (25 mg total) by mouth daily. For blood pressure. 04/30/20  Yes Doreene Nest, NP  insulin NPH Human (NOVOLIN N RELION) 100 UNIT/ML injection Inject 50 units into the skin twice daily. Patient taking differently: 50 Units in the morning and at bedtime. Inject 50 units into the skin twice daily. 11/12/20  Yes Doreene Nest, NP  insulin regular (NOVOLIN R RELION) 100 units/mL injection Inject 40 units into the skin three times daily before meals for  diabetes. 11/12/20  Yes Doreene Nest, NP  metFORMIN (GLUCOPHAGE) 500 MG tablet Take 2 tablets (1,000 mg total) by mouth 2 (two) times daily with a meal. For diabetes. 04/30/20  Yes Doreene Nest, NP  olmesartan (BENICAR) 40 MG tablet Take 1 tablet (40 mg total) by mouth daily. For blood pressure. 04/30/20  Yes Doreene Nest, NP  Continuous Blood Gluc Transmit (DEXCOM G6 TRANSMITTER) MISC 1 each by Does not apply route as directed. 04/30/20   Doreene Nest, NP  sildenafil (REVATIO) 20 MG tablet Take 2 tablets (40 mg total) by mouth as needed (30 minutes to 1 hour before intercourse). Patient not taking: Reported on 12/09/2020 03/29/19   Doreene Nest, NP  terbinafine (LAMISIL) 250 MG tablet Take 1 tablet (250 mg total) by mouth daily. Patient not taking: Reported on 12/09/2020 10/25/20 01/23/21  Theadora Rama Scales, PA-C   DG Chest 2 View  Result Date: 12/09/2020 CLINICAL DATA:  Weakness EXAM: CHEST - 2 VIEW COMPARISON:  None. FINDINGS: The heart size and mediastinal contours are within normal limits. Both lungs are clear. The visualized skeletal structures are unremarkable. IMPRESSION: No active cardiopulmonary disease. Electronically Signed   By: Jasmine Pang M.D.   On: 12/09/2020 18:09   DG Foot Complete Right  Result Date: 12/11/2020 Please see detailed radiograph report in office note.  DG Foot Complete Right  Result Date: 12/09/2020 CLINICAL DATA:  Weakness.  Diabetes.  Foot infection. EXAM: RIGHT FOOT COMPLETE - 3+ VIEW COMPARISON:  [facility] 12/09/2020 at 3:30 p.m., also MRI of the foot 12/02/2020 FINDINGS: Severe bony destructive findings involving the navicular and cuneiform bones with fragmentation and indistinctness of the structures. Mild to moderate demineralization and destructive findings in the cuboid. Substantial erosions and indistinct destructive findings involving the bases of the second through fifth metatarsals with  extensive sclerosis and periostitis especially in the fourth and fifth metatarsals compatible with osteomyelitis. Large erosions involving the head of the fifth metatarsal and the base of the proximal phalanx fifth toe. Speckled calcifications in the soft tissues. Severe dorsal soft tissue swelling potentially with tiny locules of gas in the soft tissues. Notable soft tissue swelling above the talar head. Plantar and Achilles calcaneal spurs. IMPRESSION: 1. Extensive bony destructive findings in the midfoot and Lisfranc joint, as well as along the fifth MTP joint, likely mostly from osteomyelitis and multiple confluent septic joints, although a component of Charcot arthropathy is not excluded. 2. Marked soft tissue swelling along the dorsum of the foot, similar findings on the MRI from 12/02/2020 were due to abscess and subcutaneous edema. There is potentially some minimal gas loculation along the dorsal soft tissues of the foot. Electronically Signed   By: Gaylyn Rong M.D.   On: 12/09/2020 18:12   VAS [place] ABI WITH/WO TBI  Result Date: 12/10/2020  LOWER EXTREMITY DOPPLER STUDY Patient Name:  Kenneth Cohen  Date of Exam:   12/10/2020 Medical Rec #: [id number]        Accession #:    [id number] Date of Birth: 09-05-1965        Patient Gender: M Patient Age:   19 years Exam Location:  [facility] Procedure:      VAS [place] ABI WITH/WO TBI Referring Phys: TIMOTHY OPYD --------------------------------------------------------------------------------  Indications: Ulceration, and left BKA. High Risk Factors: Hypertension, Diabetes.  Limitations: Today's exam was limited due to edema. Comparison Study: No prior study Performing Technologist: Maudry Mayhew MHA, RVT, RDCS, RDMS  Examination Guidelines: A complete evaluation includes at minimum, Doppler waveform signals and systolic blood pressure reading at the level of bilateral brachial, anterior tibial, and posterior tibial arteries, when vessel  segments are accessible. Bilateral testing is considered an integral part of a complete examination. Photoelectric Plethysmograph (PPG) waveforms and toe systolic pressure readings are included as required and additional duplex testing as needed. Limited examinations for reoccurring indications may be performed as noted.  ABI Findings: +---------+------------------+-----+---------+--------+ Right    Rt Pressure (mmHg)IndexWaveform Comment  +---------+------------------+-----+---------+--------+ Brachial 127                    triphasic         +---------+------------------+-----+---------+--------+ PTA      132               1.04 triphasic         +---------+------------------+-----+---------+--------+ DP       148               1.17 biphasic          +---------+------------------+-----+---------+--------+ Great Toe89                0.70                   +---------+------------------+-----+---------+--------+ +---------+------------------+-----+---------+-------+ Left     Lt Pressure (mmHg)IndexWaveform Comment +---------+------------------+-----+---------+-------+ Brachial 127                    triphasic        +---------+------------------+-----+---------+-------+ PTA                                      BKA     +---------+------------------+-----+---------+-------+ DP                                       BKA     +---------+------------------+-----+---------+-------+ Great Toe                                BKA     +---------+------------------+-----+---------+-------+ +-------+-----------+-----------+------------+------------+ ABI/TBIToday's ABIToday's TBIPrevious ABIPrevious TBI +-------+-----------+-----------+------------+------------+ Right  1.17       0.70                                +-------+-----------+-----------+------------+------------+ Left   BKA        BKA                                  +-------+-----------+-----------+------------+------------+  Summary: Right: Resting right ankle-brachial index is within normal range.  No evidence of significant right lower extremity arterial disease. The right toe-brachial index is normal.  *See table(s) above for measurements and observations.  Electronically signed by Deitra Mayo MD on 12/10/2020 at 12:15:12 PM.    Final    - pertinent xrays, CT, MRI studies were reviewed and independently interpreted  Positive ROS: All other systems have been reviewed and were otherwise negative with the exception of those mentioned in the HPI and as above.  Physical Exam: General: Alert, no acute distress Psychiatric: Patient is competent for consent with normal mood and affect Lymphatic: No axillary or cervical lymphadenopathy Cardiovascular: No pedal edema Respiratory: No cyanosis, no use of accessory musculature GI: No organomegaly, abdomen is soft and non-tender    Images:  @ENCIMAGES @  Labs:  Lab Results  Component Value Date   HGBA1C 6.8 (A) 11/12/2020   HGBA1C 9.2 (H) 04/30/2020   HGBA1C 8.4 (A) 03/29/2019   ESRSEDRATE 112 (H) 12/11/2020   ESRSEDRATE 113 (H) 12/10/2020   ESRSEDRATE 81 (H) 12/10/2020   CRP 14.8 (H) 12/11/2020   CRP 13.0 (H) 12/10/2020   CRP 11.0 (H) 12/10/2020   LABURIC 8.1 (H) 08/13/2015   REPTSTATUS PENDING 12/09/2020   REPTSTATUS PENDING 12/09/2020   CULT  12/09/2020    NO GROWTH 2 DAYS Performed at [facility], [place] [address], [place], [place] [address]    CULT  12/09/2020    NO GROWTH 2 DAYS Performed at [facility] [address], [place], [place] [address]     Lab Results  Component Value Date   ALBUMIN 2.6 (L) 12/09/2020   ALBUMIN 3.8 04/30/2020   ALBUMIN 4.0 03/29/2019   PREALBUMIN 8.3 (L) 12/10/2020   LABURIC 8.1 (H) 08/13/2015     CBC EXTENDED Latest Ref Rng & Units 12/11/2020 12/10/2020 12/10/2020  WBC 4.0 - 10.5 K/uL 6.6 8.9 -  RBC 4.22 - 5.81 MIL/uL 3.02(L)  3.21(L) 3.27(L)  HGB 13.0 - 17.0 g/dL 7.6(L) 8.0(L) -  HCT 39.0 - 52.0 % 24.3(L) 26.2(L) -  PLT 150 - 400 K/uL 349 414(H) -  NEUTROABS 1.7 - 7.7 K/uL - - -  LYMPHSABS 0.7 - 4.0 K/uL - - -    Neurologic: Patient does not have protective sensation bilateral lower extremities.   MUSCULOSKELETAL:   Skin: Examination patient has a end bearing ulcer on the left transtibial amputation secondary to subsiding in a socket patient has had the socket padded modified and is wearing a significant amount of ply socks.  Examination of the right lower extremity has a strong anterior tibial pulse he has ulceration dorsal lateral aspect of the right foot with swelling.  Review of the radiographs shows chronic destructive osteomyelitis through the Charcot collapse of the midfoot.  Review of the MRI scan shows a large abscess around the midfoot consistent with the areas of swelling and also shows chronic osteomyelitis across the Lisfranc complex extending out the fifth metatarsal.  Patient has protein caloric malnutrition with an albumin of 2.6 and a prealbumin of 8.3.  Patient has an old uric acid of 8.1 but no new values available..  Patient's hemoglobin A1c is currently under good control at 6.8.  Previous values were greater than 9.  Patient states he has been having difficulty with his taste and with his GI symptoms secondary to the antibiotics.  Assessment: Assessment: Diabetic insensate neuropathy with Charcot collapse osteomyelitis and abscess of the right foot.  Plan: Patient states that he would like to proceed with a transtibial amputation.  Risks and benefits were discussed patient states he understands and wishes to proceed at this time.  His current prosthesis was made in [place] and he will need a new socket on the left.  Patient may benefit from inpatient rehab or discharge to home with home health therapy.  Patient is not interested in skilled nursing rehab.  I will order nutrition  supplements as well as probiotics.   Plan for right transtibial amputation Friday morning.  Thank you for the consult and the opportunity to see Mr. Hackberry, MD [place] [phone] 3:09 PM

## 2020-12-11 NOTE — Progress Notes (Signed)
Mobility Specialist Progress Note    12/11/20 1700  Mobility  Activity Dangled on edge of bed  Level of Assistance Independent after set-up  Assistive Device None  Mobility  (Passive ROM RLE)  Mobility Response Tolerated well  Mobility performed by Mobility specialist  $Mobility charge 1 Mobility   Pt denied doing any ambulation but agreed to doing mobility on EOB. Worked on passive ROM including: RLE calf raises, knee extensions, hip (flexion, abduction and adduction). Pain inc slightly to 7/10, placed call bell by side and notified RN.    Frederico Hamman Mobility Specialist Phone Number [phone]

## 2020-12-11 NOTE — H&P (View-Only) (Signed)
ORTHOPAEDIC CONSULTATION  REQUESTING PHYSICIAN: Domenic Polite, MD  Chief Complaint: Abscess osteomyelitis Charcot right foot.  HPI: Kenneth Cohen is a 55 y.o. male who presents with diabetic insensate neuropathy and a Charcot foot on the right with abscess osteomyelitis.  Patient is status post left transtibial amputation he has been subsiding in the socket and developing an bearing ulceration.  Past Medical History:  Diagnosis Date   Diabetic Charcot foot ([place])    Hypertension    Obesity    Testicular torsion    as a child   History reviewed. No pertinent surgical history. Social History   Socioeconomic History   Marital status: Married    Spouse name: Not on file   Number of children: Not on file   Years of education: Not on file   Highest education level: Not on file  Occupational History   Not on file  Tobacco Use   Smoking status: Former    Types: Cigarettes    Quit date: 01/20/2004    Years since quitting: 16.9   Smokeless tobacco: Never  Substance and Sexual Activity   Alcohol use: No    Alcohol/week: 0.0 standard drinks    Comment: rarely   Drug use: No   Sexual activity: Not on file  Other Topics Concern   Not on file  Social History Narrative   Married.   One son, junior in [employer].   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   Enjoys playing on the computer.   Social Determinants of Health   Financial Resource Strain: Not on file  Food Insecurity: Not on file  Transportation Needs: Not on file  Physical Activity: Not on file  Stress: Not on file  Social Connections: Not on file   Family History  Problem Relation Age of Onset   Hypertension Mother    Diabetes Mother    Diabetes Father    - negative except otherwise stated in the family history section Allergies  Allergen Reactions   Cefaclor Itching, Swelling and Other (See Comments)    Reaction:  All-over body swelling   Did receive and tolerate cefazolin on adm 06/2016     Iodinated Diagnostic Agents Hives, Itching and Other (See Comments)    Immediately after receiving the iv contrast, patient started itching and developed welts on his forehead, back of head, chest and back.  ER Doctor and RN were notified and patient was told he should always be premedicated prior to receiving IV contrast.   Prior to Admission medications   Medication Sig Start Date End Date Taking? Authorizing Provider  amLODipine (NORVASC) 10 MG tablet Take 1 tablet (10 mg total) by mouth daily. For blood pressure. 04/30/20  Yes Pleas Koch, NP  Aspirin-Salicylamide-Caffeine (BC HEADACHE POWDER PO) Take 1 packet by mouth daily as needed (for headaches).   Yes [provider]  atorvastatin (LIPITOR) 20 MG tablet Take 1 tablet (20 mg total) by mouth daily. For cholesterol. 04/30/20  Yes Pleas Koch, NP  clindamycin (CLEOCIN) 300 MG capsule Take 300 mg by mouth every 6 (six) hours. 12/04/20 12/11/20 Yes [provider]  Continuous Blood Gluc Sensor (DEXCOM G6 SENSOR) MISC 1 each by Does not apply route as directed. Patient taking differently: Inject 1 Device into the skin every 14 (fourteen) days. 04/30/20  Yes Pleas Koch, NP  fluticasone (FLONASE) 50 MCG/ACT nasal spray Place 2 sprays into both nostrils daily. Patient taking differently: Place 2 sprays into both nostrils daily as needed  for allergies or rhinitis. 10/20/20  Yes Claiborne Rigg, NP  hydrochlorothiazide (HYDRODIURIL) 25 MG tablet Take 1 tablet (25 mg total) by mouth daily. For blood pressure. 04/30/20  Yes Doreene Nest, NP  insulin NPH Human (NOVOLIN N RELION) 100 UNIT/ML injection Inject 50 units into the skin twice daily. Patient taking differently: 50 Units in the morning and at bedtime. Inject 50 units into the skin twice daily. 11/12/20  Yes Doreene Nest, NP  insulin regular (NOVOLIN R RELION) 100 units/mL injection Inject 40 units into the skin three times daily before meals for  diabetes. 11/12/20  Yes Doreene Nest, NP  metFORMIN (GLUCOPHAGE) 500 MG tablet Take 2 tablets (1,000 mg total) by mouth 2 (two) times daily with a meal. For diabetes. 04/30/20  Yes Doreene Nest, NP  olmesartan (BENICAR) 40 MG tablet Take 1 tablet (40 mg total) by mouth daily. For blood pressure. 04/30/20  Yes Doreene Nest, NP  Continuous Blood Gluc Transmit (DEXCOM G6 TRANSMITTER) MISC 1 each by Does not apply route as directed. 04/30/20   Doreene Nest, NP  sildenafil (REVATIO) 20 MG tablet Take 2 tablets (40 mg total) by mouth as needed (30 minutes to 1 hour before intercourse). Patient not taking: Reported on 12/09/2020 03/29/19   Doreene Nest, NP  terbinafine (LAMISIL) 250 MG tablet Take 1 tablet (250 mg total) by mouth daily. Patient not taking: Reported on 12/09/2020 10/25/20 01/23/21  Theadora Rama Scales, PA-C   DG Chest 2 View  Result Date: 12/09/2020 CLINICAL DATA:  Weakness EXAM: CHEST - 2 VIEW COMPARISON:  None. FINDINGS: The heart size and mediastinal contours are within normal limits. Both lungs are clear. The visualized skeletal structures are unremarkable. IMPRESSION: No active cardiopulmonary disease. Electronically Signed   By: Jasmine Pang M.D.   On: 12/09/2020 18:09   DG Foot Complete Right  Result Date: 12/11/2020 Please see detailed radiograph report in office note.  DG Foot Complete Right  Result Date: 12/09/2020 CLINICAL DATA:  Weakness.  Diabetes.  Foot infection. EXAM: RIGHT FOOT COMPLETE - 3+ VIEW COMPARISON:  [facility] 12/09/2020 at 3:30 p.m., also MRI of the foot 12/02/2020 FINDINGS: Severe bony destructive findings involving the navicular and cuneiform bones with fragmentation and indistinctness of the structures. Mild to moderate demineralization and destructive findings in the cuboid. Substantial erosions and indistinct destructive findings involving the bases of the second through fifth metatarsals with  extensive sclerosis and periostitis especially in the fourth and fifth metatarsals compatible with osteomyelitis. Large erosions involving the head of the fifth metatarsal and the base of the proximal phalanx fifth toe. Speckled calcifications in the soft tissues. Severe dorsal soft tissue swelling potentially with tiny locules of gas in the soft tissues. Notable soft tissue swelling above the talar head. Plantar and Achilles calcaneal spurs. IMPRESSION: 1. Extensive bony destructive findings in the midfoot and Lisfranc joint, as well as along the fifth MTP joint, likely mostly from osteomyelitis and multiple confluent septic joints, although a component of Charcot arthropathy is not excluded. 2. Marked soft tissue swelling along the dorsum of the foot, similar findings on the MRI from 12/02/2020 were due to abscess and subcutaneous edema. There is potentially some minimal gas loculation along the dorsal soft tissues of the foot. Electronically Signed   By: Gaylyn Rong M.D.   On: 12/09/2020 18:12   VAS [place] ABI WITH/WO TBI  Result Date: 12/10/2020  LOWER EXTREMITY DOPPLER STUDY Patient Name:  Cindy Hazy Trager  Date of Exam:   12/10/2020 Medical Rec #: [id number]        Accession #:    [id number] Date of Birth: [birth year]        Patient Gender: M Patient Age:   53 years Exam Location:  [facility] Procedure:      VAS [place] ABI WITH/WO TBI Referring Phys: TIMOTHY OPYD --------------------------------------------------------------------------------  Indications: Ulceration, and left BKA. High Risk Factors: Hypertension, Diabetes.  Limitations: Today's exam was limited due to edema. Comparison Study: No prior study Performing Technologist: Maudry Mayhew MHA, RVT, RDCS, RDMS  Examination Guidelines: A complete evaluation includes at minimum, Doppler waveform signals and systolic blood pressure reading at the level of bilateral brachial, anterior tibial, and posterior tibial arteries, when vessel  segments are accessible. Bilateral testing is considered an integral part of a complete examination. Photoelectric Plethysmograph (PPG) waveforms and toe systolic pressure readings are included as required and additional duplex testing as needed. Limited examinations for reoccurring indications may be performed as noted.  ABI Findings: +---------+------------------+-----+---------+--------+ Right    Rt Pressure (mmHg)IndexWaveform Comment  +---------+------------------+-----+---------+--------+ Brachial 127                    triphasic         +---------+------------------+-----+---------+--------+ PTA      132               1.04 triphasic         +---------+------------------+-----+---------+--------+ DP       148               1.17 biphasic          +---------+------------------+-----+---------+--------+ Great Toe89                0.70                   +---------+------------------+-----+---------+--------+ +---------+------------------+-----+---------+-------+ Left     Lt Pressure (mmHg)IndexWaveform Comment +---------+------------------+-----+---------+-------+ Brachial 127                    triphasic        +---------+------------------+-----+---------+-------+ PTA                                      BKA     +---------+------------------+-----+---------+-------+ DP                                       BKA     +---------+------------------+-----+---------+-------+ Great Toe                                BKA     +---------+------------------+-----+---------+-------+ +-------+-----------+-----------+------------+------------+ ABI/TBIToday's ABIToday's TBIPrevious ABIPrevious TBI +-------+-----------+-----------+------------+------------+ Right  1.17       0.70                                +-------+-----------+-----------+------------+------------+ Left   BKA        BKA                                  +-------+-----------+-----------+------------+------------+  Summary: Right: Resting right ankle-brachial index is within normal range.  No evidence of significant right lower extremity arterial disease. The right toe-brachial index is normal.  *See table(s) above for measurements and observations.  Electronically signed by Deitra Mayo MD on 12/10/2020 at 12:15:12 PM.    Final    - pertinent xrays, CT, MRI studies were reviewed and independently interpreted  Positive ROS: All other systems have been reviewed and were otherwise negative with the exception of those mentioned in the HPI and as above.  Physical Exam: General: Alert, no acute distress Psychiatric: Patient is competent for consent with normal mood and affect Lymphatic: No axillary or cervical lymphadenopathy Cardiovascular: No pedal edema Respiratory: No cyanosis, no use of accessory musculature GI: No organomegaly, abdomen is soft and non-tender    Images:  @ENCIMAGES @  Labs:  Lab Results  Component Value Date   HGBA1C 6.8 (A) 11/12/2020   HGBA1C 9.2 (H) 04/30/2020   HGBA1C 8.4 (A) 03/29/2019   ESRSEDRATE 112 (H) 12/11/2020   ESRSEDRATE 113 (H) 12/10/2020   ESRSEDRATE 81 (H) 12/10/2020   CRP 14.8 (H) 12/11/2020   CRP 13.0 (H) 12/10/2020   CRP 11.0 (H) 12/10/2020   LABURIC 8.1 (H) 08/13/2015   REPTSTATUS PENDING 12/09/2020   REPTSTATUS PENDING 12/09/2020   CULT  12/09/2020    NO GROWTH 2 DAYS Performed at [facility], [place] [address] [place], [place] [address]    CULT  12/09/2020    NO GROWTH 2 DAYS Performed at [place] [address] [place], [place] [address]     Lab Results  Component Value Date   ALBUMIN 2.6 (L) 12/09/2020   ALBUMIN 3.8 04/30/2020   ALBUMIN 4.0 03/29/2019   PREALBUMIN 8.3 (L) 12/10/2020   LABURIC 8.1 (H) 08/13/2015     CBC EXTENDED Latest Ref Rng & Units 12/11/2020 12/10/2020 12/10/2020  WBC 4.0 - 10.5 K/uL 6.6 8.9 -  RBC 4.22 - 5.81 MIL/uL 3.02(L)  3.21(L) 3.27(L)  HGB 13.0 - 17.0 g/dL 7.6(L) 8.0(L) -  HCT 39.0 - 52.0 % 24.3(L) 26.2(L) -  PLT 150 - 400 K/uL 349 414(H) -  NEUTROABS 1.7 - 7.7 K/uL - - -  LYMPHSABS 0.7 - 4.0 K/uL - - -    Neurologic: Patient does not have protective sensation bilateral lower extremities.   MUSCULOSKELETAL:   Skin: Examination patient has a end bearing ulcer on the left transtibial amputation secondary to subsiding in a socket patient has had the socket padded modified and is wearing a significant amount of ply socks.  Examination of the right lower extremity has a strong anterior tibial pulse he has ulceration dorsal lateral aspect of the right foot with swelling.  Review of the radiographs shows chronic destructive osteomyelitis through the Charcot collapse of the midfoot.  Review of the MRI scan shows a large abscess around the midfoot consistent with the areas of swelling and also shows chronic osteomyelitis across the Lisfranc complex extending out the fifth metatarsal.  Patient has protein caloric malnutrition with an albumin of 2.6 and a prealbumin of 8.3.  Patient has an old uric acid of 8.1 but no new values available..  Patient's hemoglobin A1c is currently under good control at 6.8.  Previous values were greater than 9.  Patient states he has been having difficulty with his taste and with his GI symptoms secondary to the antibiotics.  Assessment: Assessment: Diabetic insensate neuropathy with Charcot collapse osteomyelitis and abscess of the right foot.  Plan: Patient states that he would like to proceed with a transtibial amputation.  Risks and benefits were discussed patient states he understands and wishes to proceed at this time.  His current prosthesis was made in [place] and he will need a new socket on the left.  Patient may benefit from inpatient rehab or discharge to home with home health therapy.  Patient is not interested in skilled nursing rehab.  I will order nutrition  supplements as well as probiotics.   Plan for right transtibial amputation Friday morning.  Thank you for the consult and the opportunity to see Mr. Hubbell, MD [place] [phone] 3:09 PM

## 2020-12-12 DIAGNOSIS — L089 Local infection of the skin and subcutaneous tissue, unspecified: Secondary | ICD-10-CM | POA: Diagnosis not present

## 2020-12-12 DIAGNOSIS — E11628 Type 2 diabetes mellitus with other skin complications: Secondary | ICD-10-CM | POA: Diagnosis not present

## 2020-12-12 LAB — CBC
HCT: 23.9 % — ABNORMAL LOW (ref 39.0–52.0)
Hemoglobin: 7.3 g/dL — ABNORMAL LOW (ref 13.0–17.0)
MCH: 24.6 pg — ABNORMAL LOW (ref 26.0–34.0)
MCHC: 30.5 g/dL (ref 30.0–36.0)
MCV: 80.5 fL (ref 80.0–100.0)
Platelets: 325 10*3/uL (ref 150–400)
RBC: 2.97 MIL/uL — ABNORMAL LOW (ref 4.22–5.81)
RDW: 15.9 % — ABNORMAL HIGH (ref 11.5–15.5)
WBC: 5.8 10*3/uL (ref 4.0–10.5)
nRBC: 0 % (ref 0.0–0.2)

## 2020-12-12 LAB — GLUCOSE, CAPILLARY
Glucose-Capillary: 126 mg/dL — ABNORMAL HIGH (ref 70–99)
Glucose-Capillary: 129 mg/dL — ABNORMAL HIGH (ref 70–99)
Glucose-Capillary: 133 mg/dL — ABNORMAL HIGH (ref 70–99)
Glucose-Capillary: 135 mg/dL — ABNORMAL HIGH (ref 70–99)
Glucose-Capillary: 145 mg/dL — ABNORMAL HIGH (ref 70–99)
Glucose-Capillary: 150 mg/dL — ABNORMAL HIGH (ref 70–99)

## 2020-12-12 LAB — BASIC METABOLIC PANEL
Anion gap: 10 (ref 5–15)
BUN: 11 mg/dL (ref 6–20)
CO2: 23 mmol/L (ref 22–32)
Calcium: 8.6 mg/dL — ABNORMAL LOW (ref 8.9–10.3)
Chloride: 100 mmol/L (ref 98–111)
Creatinine, Ser: 0.99 mg/dL (ref 0.61–1.24)
GFR, Estimated: >60 mL/min (ref >60)
Glucose, Bld: 131 mg/dL — ABNORMAL HIGH (ref 70–99)
Potassium: 3.7 mmol/L (ref 3.5–5.1)
Sodium: 133 mmol/L — ABNORMAL LOW (ref 135–145)

## 2020-12-12 LAB — C-REACTIVE PROTEIN: CRP: 11.3 mg/dL — ABNORMAL HIGH (ref <1.0)

## 2020-12-12 MED ORDER — CLINDAMYCIN PHOSPHATE 900 MG/50ML IV SOLN
900.0000 mg | INTRAVENOUS | Status: AC
  Administered 2020-12-13: 900 mg via INTRAVENOUS
  Filled 2020-12-12: qty 50

## 2020-12-12 MED ORDER — SODIUM CHLORIDE 0.9 % IV SOLN
250.0000 mg | Freq: Every day | INTRAVENOUS | Status: DC
  Administered 2020-12-12 – 2020-12-15 (×2): 250 mg via INTRAVENOUS
  Filled 2020-12-12 (×4): qty 20

## 2020-12-12 MED ORDER — VANCOMYCIN HCL 1750 MG/350ML IV SOLN
1750.0000 mg | Freq: Two times a day (BID) | INTRAVENOUS | Status: AC
  Administered 2020-12-12 – 2020-12-14 (×3): 1750 mg via INTRAVENOUS
  Filled 2020-12-12 (×5): qty 350

## 2020-12-12 NOTE — Progress Notes (Signed)
PROGRESS NOTE    STANELY CHAPEL  [id number] DOB: Jan 23, 1965 DOA: 12/09/2020 PCP: Pleas Koch, NP    Brief Narrative:  55 yo male with the past medical history of T2DM, HTN, left BKA and right foot wound who presented with right foot infection. Patient had right foot edema, erythema and tenderness for about 3 to 4 weeks, he was seen at a wound center and by her PCP who referred him to a podiatrist, subsequently had an MRI done on 11/14 was concerning for abscesses, septic joint and osteomyelitis, referred to the emergency room  -Presented to the ED 11/21  -Started on broad-spectrum antibiotics, seen by vascular surgery, recommended orthopedics evaluation   Assessment & Plan:   Right diabetic foot abscesses, septic joint and osteomyelitis -MRI completed prior to admission on 11/14 reviewed -Appreciate vascular surgery consultation, recommended orthopedics eval -Continue broad-spectrum antibiotics -Orthopedics consulted, plan for right BKA tomorrow -ABIs were normal  AKI on CKD stage 2,  Hypovolemic hyponatremia.  -Resolved with hydration and supportive care  Iron deficiency anemia Anemia of chronic disease -Hemoglobin trending down further, will give IV iron today  T2DM/ dyslipidemia.  -CBG stable on current regimen  -Follow-up hemoglobin A1c  HTN prolonged QTc  -BP stable, amlodipine HCTZ and olmesartan on hold.  Obesity -BMI 37.5   DVT prophylaxis: Enoxaparin   Code Status:    full  Family Communication:  Discussed with patient in detail, no family at bedside   Consultants:  Vascular surgery  Orthopedics   Antimicrobials:  Vancomycin and ceftriaxone     Subjective: -Feels okay, ready to have BKA tomorrow  Objective: Vitals:   12/10/20 2020 12/11/20 0816 12/11/20 2140 12/12/20 0743  BP: 105/64 112/72 106/69 117/67  Pulse: 92 96 85 87  Resp: 16 20 18 17   Temp: 98.8 F (37.1 C) 98 F (36.7 C) 98.6 F (37 C) 98.6 F (37 C)  TempSrc: Oral  Oral Oral Oral  SpO2: 100% 100% 100% 100%  Weight:      Height:        Intake/Output Summary (Last 24 hours) at 12/12/2020 1145 Last data filed at 12/12/2020 0500 Gross per 24 hour  Intake 200 ml  Output 1300 ml  Net -1100 ml   Filed Weights   12/09/20 1837 12/09/20 1932 12/10/20 0448  Weight: (!) 154.7 kg (!) 154.7 kg (!) 147.3 kg    Examination:   General: Gen: Awake, Alert, Oriented X 3,  HEENT: no JVD Lungs: Good air movement bilaterally, CTAB CVS: S1S2/RRR Abd: soft, Non tender, non distended, BS present Extremities:  Right foot with Charcot deformity, swelling tenderness and open wounds laterally with drainage, left BKA, Psych: Flat affect   Data Reviewed: I have personally reviewed following labs and imaging studies  CBC: Recent Labs  Lab 12/09/20 1711 12/10/20 0301 12/11/20 0158 12/12/20 0128  WBC 9.9 8.9 6.6 5.8  NEUTROABS 7.6  --   --   --   HGB 8.9* 8.0* 7.6* 7.3*  HCT 29.0* 26.2* 24.3* 23.9*  MCV 81.5 81.6 80.5 80.5  PLT 490* 414* 349 [id number]   Basic Metabolic Panel: Recent Labs  Lab 12/09/20 1711 12/10/20 0047 12/10/20 0301 12/11/20 0158 12/12/20 0128  NA 129*  --  131* 134* 133*  K 4.0  --  3.9 4.1 3.7  CL 93*  --  97* 101 100  CO2 24  --  20* 25 23  GLUCOSE 185*  --  165* 153* 131*  BUN 21*  --  23* 14  11  CREATININE 1.33*  --  1.49* 1.05 0.99  CALCIUM 9.3  --  8.8* 9.0 8.6*  MG  --  1.6*  --   --   --    GFR: Estimated Creatinine Clearance: 137.3 mL/min (by C-G formula based on SCr of 0.99 mg/dL). Liver Function Tests: Recent Labs  Lab 12/09/20 1711  AST 15  ALT 13  ALKPHOS 83  BILITOT 0.7  PROT 8.0  ALBUMIN 2.6*   No results for input(s): LIPASE, AMYLASE in the last 168 hours. No results for input(s): AMMONIA in the last 168 hours. Coagulation Profile: Recent Labs  Lab 12/09/20 1711  INR 1.1   Cardiac Enzymes: No results for input(s): CKTOTAL, CKMB, CKMBINDEX, TROPONINI in the last 168 hours. BNP (last 3 results) No  results for input(s): PROBNP in the last 8760 hours. HbA1C: No results for input(s): HGBA1C in the last 72 hours. CBG: Recent Labs  Lab 12/11/20 1532 12/11/20 2038 12/12/20 0017 12/12/20 0406 12/12/20 0834  GLUCAP 185* 153* 135* 133* 126*   Lipid Profile: No results for input(s): CHOL, HDL, LDLCALC, TRIG, CHOLHDL, LDLDIRECT in the last 72 hours. Thyroid Function Tests: No results for input(s): TSH, T4TOTAL, FREET4, T3FREE, THYROIDAB in the last 72 hours. Anemia Panel: Recent Labs    12/10/20 0301  VITAMINB12 415  FOLATE 8.4  FERRITIN 390*  TIBC 274  IRON 23*  RETICCTPCT 2.0    Radiology Studies: I have reviewed all of the imaging during this hospital visit personally  Scheduled Meds:  atorvastatin  20 mg Oral Daily   chlorhexidine  60 mL Topical Once   influenza vac split quadrivalent PF  0.5 mL Intramuscular Tomorrow-1000   insulin aspart  0-15 Units Subcutaneous Q4H   insulin glargine-yfgn  30 Units Subcutaneous BID   metFORMIN  1,000 mg Oral BID WC   povidone-iodine  2 application Topical Once   Continuous Infusions:  cefTRIAXone (ROCEPHIN)  IV 2 g (12/11/20 2339)   [START ON 12/13/2020] clindamycin (CLEOCIN) IV     tranexamic acid     vancomycin       LOS: 3 days   Zannie Cove, MD

## 2020-12-12 NOTE — Progress Notes (Signed)
Pharmacy Antibiotic Note  Kenneth Cohen is a 55 y.o. male admitted on 12/09/2020 with diabetic R foot infection. Afebrile and WBC 9.9. In ED started on zosyn x1, flagyl, ceftriaxone, and vancomycin. Pharmacy has been consulted for vancomycin dosing.  Per ortho note 11/24, patient wishes to proceed with transtibial amputation 11/25. With improving renal function empiric dosing indicates patient needs an increase vancomycin dose, however will not get levels at this time as patient may not need vancomycin post-op, and renal function may fluctuate with procedure.   AUC goal: 400-550   Plan: Increase Vancomycin to 1,750 mg Q12H (eAUC 495, Scr used 0.99)  F/u renal function and adjust dosing as needed Continue ceftriaxone     Height: 6\' 6"  (198.1 cm) Weight: (!) 147.3 kg (324 lb 11.8 oz) IBW/kg (Calculated) : 91.4  Temp (24hrs), Avg:98.6 F (37 C), Min:98.6 F (37 C), Max:98.6 F (37 C)  Recent Labs  Lab 12/09/20 1711 12/10/20 0047 12/10/20 0301 12/11/20 0158 12/12/20 0128  WBC 9.9  --  8.9 6.6 5.8  CREATININE 1.33*  --  1.49* 1.05 0.99  LATICACIDVEN 1.8 1.2  --   --   --      Estimated Creatinine Clearance: 137.3 mL/min (by C-G formula based on SCr of 0.99 mg/dL).    Allergies  Allergen Reactions   Cefaclor Itching, Swelling and Other (See Comments)    Reaction:  All-over body swelling   Did receive and tolerate cefazolin on adm 06/2016    Iodinated Diagnostic Agents Hives, Itching and Other (See Comments)    Immediately after receiving the iv contrast, patient started itching and developed welts on his forehead, back of head, chest and back.  ER Doctor and RN were notified and patient was told he should always be premedicated prior to receiving IV contrast.    Antimicrobials this admission: Vancomycin 11/21 > Flagyl 11/21 >11/22 Ceftriaxone 11/21 >(11/28)  Dose adjustments this admission: - vancomycin 1250 mg Q12H>>vancomycin 1750 mg Q12H on 11/24   Microbiology  results: 11/21 BCx: ngtd   12/21, PharmD PGY-1 Acute Care Resident  12/12/2020 11:27 AM

## 2020-12-13 ENCOUNTER — Inpatient Hospital Stay (HOSPITAL_COMMUNITY): Payer: Medicare Other | Admitting: Anesthesiology

## 2020-12-13 ENCOUNTER — Encounter (HOSPITAL_COMMUNITY)
Admission: EM | Disposition: A | Discharge status: Home Health | Payer: Self-pay | Source: Ambulatory Visit | Attending: Internal Medicine

## 2020-12-13 ENCOUNTER — Encounter (HOSPITAL_COMMUNITY): Payer: Self-pay | Admitting: Family Medicine

## 2020-12-13 DIAGNOSIS — M86271 Subacute osteomyelitis, right ankle and foot: Secondary | ICD-10-CM | POA: Diagnosis not present

## 2020-12-13 DIAGNOSIS — E11628 Type 2 diabetes mellitus with other skin complications: Secondary | ICD-10-CM | POA: Diagnosis not present

## 2020-12-13 DIAGNOSIS — L089 Local infection of the skin and subcutaneous tissue, unspecified: Secondary | ICD-10-CM | POA: Diagnosis not present

## 2020-12-13 DIAGNOSIS — E11610 Type 2 diabetes mellitus with diabetic neuropathic arthropathy: Secondary | ICD-10-CM | POA: Diagnosis not present

## 2020-12-13 DIAGNOSIS — E43 Unspecified severe protein-calorie malnutrition: Secondary | ICD-10-CM | POA: Diagnosis not present

## 2020-12-13 HISTORY — PX: AMPUTATION: SHX166

## 2020-12-13 HISTORY — PX: APPLICATION OF WOUND VAC: SHX5189

## 2020-12-13 LAB — CBC
HCT: 24 % — ABNORMAL LOW (ref 39.0–52.0)
Hemoglobin: 7.4 g/dL — ABNORMAL LOW (ref 13.0–17.0)
MCH: 24.8 pg — ABNORMAL LOW (ref 26.0–34.0)
MCHC: 30.8 g/dL (ref 30.0–36.0)
MCV: 80.5 fL (ref 80.0–100.0)
Platelets: 323 10*3/uL (ref 150–400)
RBC: 2.98 MIL/uL — ABNORMAL LOW (ref 4.22–5.81)
RDW: 15.9 % — ABNORMAL HIGH (ref 11.5–15.5)
WBC: 5.2 10*3/uL (ref 4.0–10.5)
nRBC: 0 % (ref 0.0–0.2)

## 2020-12-13 LAB — BASIC METABOLIC PANEL
Anion gap: 8 (ref 5–15)
BUN: 9 mg/dL (ref 6–20)
CO2: 24 mmol/L (ref 22–32)
Calcium: 8.1 mg/dL — ABNORMAL LOW (ref 8.9–10.3)
Chloride: 102 mmol/L (ref 98–111)
Creatinine, Ser: 0.89 mg/dL (ref 0.61–1.24)
GFR, Estimated: >60 mL/min (ref >60)
Glucose, Bld: 117 mg/dL — ABNORMAL HIGH (ref 70–99)
Potassium: 3.5 mmol/L (ref 3.5–5.1)
Sodium: 134 mmol/L — ABNORMAL LOW (ref 135–145)

## 2020-12-13 LAB — GLUCOSE, CAPILLARY
Glucose-Capillary: 108 mg/dL — ABNORMAL HIGH (ref 70–99)
Glucose-Capillary: 113 mg/dL — ABNORMAL HIGH (ref 70–99)
Glucose-Capillary: 122 mg/dL — ABNORMAL HIGH (ref 70–99)
Glucose-Capillary: 129 mg/dL — ABNORMAL HIGH (ref 70–99)
Glucose-Capillary: 137 mg/dL — ABNORMAL HIGH (ref 70–99)
Glucose-Capillary: 177 mg/dL — ABNORMAL HIGH (ref 70–99)
Glucose-Capillary: 180 mg/dL — ABNORMAL HIGH (ref 70–99)
Glucose-Capillary: 185 mg/dL — ABNORMAL HIGH (ref 70–99)
Glucose-Capillary: 191 mg/dL — ABNORMAL HIGH (ref 70–99)

## 2020-12-13 LAB — SURGICAL PCR SCREEN
MRSA, PCR: NEGATIVE
Staphylococcus aureus: NEGATIVE

## 2020-12-13 LAB — HEMOGLOBIN AND HEMATOCRIT, BLOOD
HCT: 26.3 % — ABNORMAL LOW (ref 39.0–52.0)
Hemoglobin: 8.5 g/dL — ABNORMAL LOW (ref 13.0–17.0)

## 2020-12-13 LAB — ABO/RH: ABO/RH(D): A NEG

## 2020-12-13 LAB — PREPARE RBC (CROSSMATCH)

## 2020-12-13 SURGERY — AMPUTATION BELOW KNEE
Anesthesia: General | Site: Knee | Laterality: Right

## 2020-12-13 MED ORDER — JUVEN PO PACK
1.0000 | PACK | Freq: Two times a day (BID) | ORAL | Status: DC
  Administered 2020-12-13 – 2020-12-15 (×5): 1 via ORAL
  Filled 2020-12-13 (×5): qty 1

## 2020-12-13 MED ORDER — SODIUM CHLORIDE 0.9 % IV SOLN: INTRAVENOUS | Status: DC | PRN

## 2020-12-13 MED ORDER — MAGNESIUM SULFATE 2 GM/50ML IV SOLN
2.0000 g | Freq: Every day | INTRAVENOUS | Status: DC | PRN
  Filled 2020-12-13: qty 50

## 2020-12-13 MED ORDER — CHLORHEXIDINE GLUCONATE 0.12 % MT SOLN
15.0000 mL | Freq: Once | OROMUCOSAL | Status: AC
  Administered 2020-12-13: 15 mL via OROMUCOSAL
  Filled 2020-12-13: qty 15

## 2020-12-13 MED ORDER — ZOLPIDEM TARTRATE 5 MG PO TABS
5.0000 mg | ORAL_TABLET | Freq: Once | ORAL | Status: AC
  Administered 2020-12-14: 5 mg via ORAL
  Filled 2020-12-13: qty 1

## 2020-12-13 MED ORDER — SODIUM CHLORIDE 0.9 % IV SOLN: 10.0000 mL/h | Freq: Once | INTRAVENOUS | Status: DC

## 2020-12-13 MED ORDER — PHENOL 1.4 % MT LIQD: 1.0000 | OROMUCOSAL | Status: DC | PRN

## 2020-12-13 MED ORDER — FENTANYL CITRATE (PF) 100 MCG/2ML IJ SOLN
100.0000 ug | Freq: Once | INTRAMUSCULAR | Status: AC
  Filled 2020-12-13: qty 2

## 2020-12-13 MED ORDER — HYDROMORPHONE HCL 1 MG/ML IJ SOLN
0.5000 mg | INTRAMUSCULAR | Status: DC | PRN
  Administered 2020-12-15: 02:00:00 1 mg via INTRAVENOUS
  Filled 2020-12-13: qty 1

## 2020-12-13 MED ORDER — ASCORBIC ACID 500 MG PO TABS
1000.0000 mg | ORAL_TABLET | Freq: Every day | ORAL | Status: DC
  Administered 2020-12-13 – 2020-12-15 (×3): 1000 mg via ORAL
  Filled 2020-12-13 (×3): qty 2

## 2020-12-13 MED ORDER — ONDANSETRON HCL 4 MG/2ML IJ SOLN
INTRAMUSCULAR | Status: AC
  Filled 2020-12-13: qty 2

## 2020-12-13 MED ORDER — ACETAMINOPHEN 325 MG PO TABS
325.0000 mg | ORAL_TABLET | Freq: Four times a day (QID) | ORAL | Status: DC | PRN
  Filled 2020-12-13: qty 2

## 2020-12-13 MED ORDER — OXYCODONE HCL 5 MG PO TABS
10.0000 mg | ORAL_TABLET | ORAL | Status: DC | PRN
  Administered 2020-12-14 – 2020-12-15 (×5): 15 mg via ORAL
  Filled 2020-12-13 (×4): qty 3

## 2020-12-13 MED ORDER — SODIUM CHLORIDE 0.9% IV SOLUTION: Freq: Once | INTRAVENOUS | Status: DC

## 2020-12-13 MED ORDER — HYDRALAZINE HCL 20 MG/ML IJ SOLN: 5.0000 mg | INTRAMUSCULAR | Status: DC | PRN

## 2020-12-13 MED ORDER — FENTANYL CITRATE (PF) 250 MCG/5ML IJ SOLN
INTRAMUSCULAR | Status: AC
  Filled 2020-12-13: qty 5

## 2020-12-13 MED ORDER — LACTATED RINGERS IV SOLN: INTRAVENOUS | Status: DC

## 2020-12-13 MED ORDER — POLYETHYLENE GLYCOL 3350 17 G PO PACK: 17.0000 g | PACK | Freq: Every day | ORAL | Status: DC | PRN

## 2020-12-13 MED ORDER — AMISULPRIDE (ANTIEMETIC) 5 MG/2ML IV SOLN: 10.0000 mg | Freq: Once | INTRAVENOUS | Status: DC | PRN

## 2020-12-13 MED ORDER — POTASSIUM CHLORIDE CRYS ER 20 MEQ PO TBCR: 20.0000 meq | EXTENDED_RELEASE_TABLET | Freq: Every day | ORAL | Status: DC | PRN

## 2020-12-13 MED ORDER — LACTATED RINGERS IV SOLN: INTRAVENOUS | Status: DC | PRN

## 2020-12-13 MED ORDER — PHENYLEPHRINE 40 MCG/ML (10ML) SYRINGE FOR IV PUSH (FOR BLOOD PRESSURE SUPPORT)
PREFILLED_SYRINGE | INTRAVENOUS | Status: AC
  Filled 2020-12-13: qty 20

## 2020-12-13 MED ORDER — VANCOMYCIN HCL 1000 MG/200ML IV SOLN: 1000.0000 mg | Freq: Two times a day (BID) | INTRAVENOUS | Status: DC

## 2020-12-13 MED ORDER — BUPIVACAINE LIPOSOME 1.3 % IJ SUSP
INTRAMUSCULAR | Status: DC | PRN
  Administered 2020-12-13: 10 mL via PERINEURAL

## 2020-12-13 MED ORDER — PROPOFOL 10 MG/ML IV BOLUS
INTRAVENOUS | Status: DC | PRN
  Administered 2020-12-13: 200 mg via INTRAVENOUS

## 2020-12-13 MED ORDER — LABETALOL HCL 5 MG/ML IV SOLN: 10.0000 mg | INTRAVENOUS | Status: DC | PRN

## 2020-12-13 MED ORDER — SODIUM CHLORIDE (PF) 0.9 % IJ SOLN
INTRAMUSCULAR | Status: AC
  Filled 2020-12-13: qty 10

## 2020-12-13 MED ORDER — ALUM & MAG HYDROXIDE-SIMETH 200-200-20 MG/5ML PO SUSP: 15.0000 mL | ORAL | Status: DC | PRN

## 2020-12-13 MED ORDER — BUPIVACAINE-EPINEPHRINE (PF) 0.5% -1:200000 IJ SOLN
INTRAMUSCULAR | Status: DC | PRN
  Administered 2020-12-13: 15 mL via PERINEURAL

## 2020-12-13 MED ORDER — FENTANYL CITRATE (PF) 100 MCG/2ML IJ SOLN: 25.0000 ug | INTRAMUSCULAR | Status: DC | PRN

## 2020-12-13 MED ORDER — ZINC SULFATE 220 (50 ZN) MG PO CAPS
220.0000 mg | ORAL_CAPSULE | Freq: Every day | ORAL | Status: DC
  Administered 2020-12-13 – 2020-12-15 (×3): 220 mg via ORAL
  Filled 2020-12-13 (×3): qty 1

## 2020-12-13 MED ORDER — DOCUSATE SODIUM 100 MG PO CAPS
100.0000 mg | ORAL_CAPSULE | Freq: Every day | ORAL | Status: DC
  Filled 2020-12-13: qty 1

## 2020-12-13 MED ORDER — SODIUM CHLORIDE 0.9 % IV SOLN: INTRAVENOUS | Status: DC

## 2020-12-13 MED ORDER — ACETAMINOPHEN 325 MG PO TABS
325.0000 mg | ORAL_TABLET | Freq: Four times a day (QID) | ORAL | Status: DC | PRN
Start: 2020-12-13 — End: 2020-12-15
  Administered 2020-12-14 (×2): 650 mg via ORAL
  Filled 2020-12-13: qty 2

## 2020-12-13 MED ORDER — DEXAMETHASONE SODIUM PHOSPHATE 10 MG/ML IJ SOLN
INTRAMUSCULAR | Status: DC | PRN
  Administered 2020-12-13: 4 mg via INTRAVENOUS

## 2020-12-13 MED ORDER — ROPIVACAINE HCL 5 MG/ML IJ SOLN
INTRAMUSCULAR | Status: DC | PRN
  Administered 2020-12-13: 20 mL via PERINEURAL

## 2020-12-13 MED ORDER — PHENYLEPHRINE 40 MCG/ML (10ML) SYRINGE FOR IV PUSH (FOR BLOOD PRESSURE SUPPORT)
PREFILLED_SYRINGE | INTRAVENOUS | Status: DC | PRN
  Administered 2020-12-13: 120 ug via INTRAVENOUS
  Administered 2020-12-13 (×3): 80 ug via INTRAVENOUS
  Administered 2020-12-13: 40 ug via INTRAVENOUS
  Administered 2020-12-13 (×2): 80 ug via INTRAVENOUS

## 2020-12-13 MED ORDER — ONDANSETRON HCL 4 MG/2ML IJ SOLN
INTRAMUSCULAR | Status: DC | PRN
  Administered 2020-12-13: 4 mg via INTRAVENOUS

## 2020-12-13 MED ORDER — 0.9 % SODIUM CHLORIDE (POUR BTL) OPTIME
TOPICAL | Status: DC | PRN
  Administered 2020-12-13: 1000 mL

## 2020-12-13 MED ORDER — BISACODYL 5 MG PO TBEC: 5.0000 mg | DELAYED_RELEASE_TABLET | Freq: Every day | ORAL | Status: DC | PRN

## 2020-12-13 MED ORDER — MIDAZOLAM HCL 2 MG/2ML IJ SOLN
INTRAMUSCULAR | Status: AC
  Administered 2020-12-13: 2 mg via INTRAVENOUS
  Filled 2020-12-13: qty 2

## 2020-12-13 MED ORDER — ORAL CARE MOUTH RINSE: 15.0000 mL | Freq: Once | OROMUCOSAL | Status: AC

## 2020-12-13 MED ORDER — LIDOCAINE 2% (20 MG/ML) 5 ML SYRINGE
INTRAMUSCULAR | Status: DC | PRN
  Administered 2020-12-13: 20 mg via INTRAVENOUS

## 2020-12-13 MED ORDER — PANTOPRAZOLE SODIUM 40 MG PO TBEC
40.0000 mg | DELAYED_RELEASE_TABLET | Freq: Every day | ORAL | Status: DC
  Administered 2020-12-14 – 2020-12-15 (×2): 40 mg via ORAL
  Filled 2020-12-13 (×2): qty 1

## 2020-12-13 MED ORDER — OXYCODONE HCL 5 MG PO TABS
5.0000 mg | ORAL_TABLET | ORAL | Status: DC | PRN
  Filled 2020-12-13: qty 2
  Filled 2020-12-13: qty 1

## 2020-12-13 MED ORDER — FENTANYL CITRATE (PF) 100 MCG/2ML IJ SOLN
INTRAMUSCULAR | Status: AC
  Administered 2020-12-13: 50 ug via INTRAVENOUS
  Filled 2020-12-13: qty 2

## 2020-12-13 MED ORDER — MIDAZOLAM HCL 2 MG/2ML IJ SOLN
2.0000 mg | Freq: Once | INTRAMUSCULAR | Status: AC
  Filled 2020-12-13: qty 2

## 2020-12-13 MED ORDER — GUAIFENESIN-DM 100-10 MG/5ML PO SYRP: 15.0000 mL | ORAL_SOLUTION | ORAL | Status: DC | PRN

## 2020-12-13 MED ORDER — METOPROLOL TARTRATE 5 MG/5ML IV SOLN: 2.0000 mg | INTRAVENOUS | Status: DC | PRN

## 2020-12-13 MED ORDER — FENTANYL CITRATE (PF) 250 MCG/5ML IJ SOLN
INTRAMUSCULAR | Status: DC | PRN
  Administered 2020-12-13: 50 ug via INTRAVENOUS
  Administered 2020-12-13: 25 ug via INTRAVENOUS

## 2020-12-13 MED ORDER — MAGNESIUM CITRATE PO SOLN: 1.0000 | Freq: Once | ORAL | Status: DC | PRN

## 2020-12-13 SURGICAL SUPPLY — 37 items
BAG COUNTER SPONGE SURGICOUNT (BAG) ×1 IMPLANT
BLADE SAW RECIP 87.9 MT (BLADE) ×3 IMPLANT
BLADE SURG 21 STRL SS (BLADE) ×3 IMPLANT
BNDG COHESIVE 6X5 TAN STRL LF (GAUZE/BANDAGES/DRESSINGS) IMPLANT
CANISTER WOUND CARE 500ML ATS (WOUND CARE) ×3 IMPLANT
COVER SURGICAL LIGHT HANDLE (MISCELLANEOUS) ×3 IMPLANT
CUFF TOURN SGL QUICK 34 (TOURNIQUET CUFF) ×3
CUFF TRNQT CYL 34X4.125X (TOURNIQUET CUFF) ×2 IMPLANT
DRAPE DERMATAC (DRAPES) ×3 IMPLANT
DRAPE INCISE IOBAN 66X45 STRL (DRAPES) ×3 IMPLANT
DRAPE U-SHAPE 47X51 STRL (DRAPES) ×3 IMPLANT
DRESSING PREVENA PLUS CUSTOM (GAUZE/BANDAGES/DRESSINGS) ×2 IMPLANT
DRSG PREVENA PLUS CUSTOM (GAUZE/BANDAGES/DRESSINGS) ×3
DURAPREP 26ML APPLICATOR (WOUND CARE) ×3 IMPLANT
ELECT REM PT RETURN 9FT ADLT (ELECTROSURGICAL) ×3
ELECTRODE REM PT RTRN 9FT ADLT (ELECTROSURGICAL) ×2 IMPLANT
GLOVE SURG ORTHO LTX SZ9 (GLOVE) ×3 IMPLANT
GLOVE SURG UNDER POLY LF SZ9 (GLOVE) ×3 IMPLANT
GOWN STRL REUS W/ TWL XL LVL3 (GOWN DISPOSABLE) ×4 IMPLANT
GOWN STRL REUS W/TWL XL LVL3 (GOWN DISPOSABLE) ×6
KIT BASIN OR (CUSTOM PROCEDURE TRAY) ×3 IMPLANT
KIT TURNOVER KIT B (KITS) ×3 IMPLANT
MANIFOLD NEPTUNE II (INSTRUMENTS) ×3 IMPLANT
NS IRRIG 1000ML POUR BTL (IV SOLUTION) ×3 IMPLANT
PACK ORTHO EXTREMITY (CUSTOM PROCEDURE TRAY) ×3 IMPLANT
PAD ARMBOARD 7.5X6 YLW CONV (MISCELLANEOUS) ×3 IMPLANT
PREVENA RESTOR ARTHOFORM 46X30 (CANNISTER) ×3 IMPLANT
SPONGE T-LAP 18X18 ~~LOC~~+RFID (SPONGE) ×2 IMPLANT
STAPLER VISISTAT 35W (STAPLE) ×1 IMPLANT
STOCKINETTE IMPERVIOUS LG (DRAPES) ×3 IMPLANT
SUT ETHILON 2 0 PSLX (SUTURE) ×1 IMPLANT
SUT SILK 2 0 (SUTURE) ×3
SUT SILK 2-0 18XBRD TIE 12 (SUTURE) ×2 IMPLANT
SUT VIC AB 1 CTX 27 (SUTURE) ×6 IMPLANT
TOWEL GREEN STERILE (TOWEL DISPOSABLE) ×3 IMPLANT
TUBE CONNECTING 12X1/4 (SUCTIONS) ×3 IMPLANT
YANKAUER SUCT BULB TIP NO VENT (SUCTIONS) ×3 IMPLANT

## 2020-12-13 NOTE — Anesthesia Preprocedure Evaluation (Signed)
Anesthesia Evaluation  Patient identified by MRN, date of birth, ID band Patient awake    Reviewed: Allergy & Precautions, NPO status , Patient's Chart, lab work & pertinent test results  Airway Mallampati: II  TM Distance: >3 FB Neck ROM: Full    Dental  (+) Dental Advisory Given   Pulmonary sleep apnea , former smoker,    breath sounds clear to auscultation       Cardiovascular hypertension, Pt. on medications  Rhythm:Regular Rate:Normal     Neuro/Psych  Neuromuscular disease    GI/Hepatic negative GI ROS, Neg liver ROS,   Endo/Other  diabetes  Renal/GU Renal disease     Musculoskeletal  (+) Arthritis ,   Abdominal   Peds  Hematology  (+) anemia ,   Anesthesia Other Findings   Reproductive/Obstetrics                             Lab Results  Component Value Date   WBC 5.2 12/13/2020   HGB 7.4 (L) 12/13/2020   HCT 24.0 (L) 12/13/2020   MCV 80.5 12/13/2020   PLT 323 12/13/2020   Lab Results  Component Value Date   CREATININE 0.89 12/13/2020   BUN 9 12/13/2020   NA 134 (L) 12/13/2020   K 3.5 12/13/2020   CL 102 12/13/2020   CO2 24 12/13/2020    Anesthesia Physical Anesthesia Plan  ASA: 3  Anesthesia Plan: General   Post-op Pain Management: Regional block   Induction:   PONV Risk Score and Plan: 2 and Dexamethasone, Ondansetron, Treatment may vary due to age or medical condition and Midazolam  Airway Management Planned: LMA  Additional Equipment: None  Intra-op Plan:   Post-operative Plan: Extubation in OR  Informed Consent: I have reviewed the patients History and Physical, chart, labs and discussed the procedure including the risks, benefits and alternatives for the proposed anesthesia with the patient or authorized representative who has indicated his/her understanding and acceptance.     Dental advisory given  Plan Discussed with: CRNA  Anesthesia Plan  Comments:         Anesthesia Quick Evaluation

## 2020-12-13 NOTE — Anesthesia Postprocedure Evaluation (Signed)
Anesthesia Post Note  Patient: Kenneth Cohen  Procedure(s) Performed: AMPUTATION BELOW KNEE (Right: Knee) APPLICATION OF WOUND VAC (Right)     Patient location during evaluation: PACU Anesthesia Type: General Level of consciousness: awake and alert Pain management: pain level controlled Vital Signs Assessment: post-procedure vital signs reviewed and stable Respiratory status: spontaneous breathing, nonlabored ventilation, respiratory function stable and patient connected to nasal cannula oxygen Cardiovascular status: blood pressure returned to baseline and stable Postop Assessment: no apparent nausea or vomiting Anesthetic complications: no   No notable events documented.  Last Vitals:  Vitals:   12/13/20 1112 12/13/20 1122  BP: 118/70 103/74  Pulse: 79 81  Resp: (!) 23 18  Temp: (!) 36.3 C 36.8 C  SpO2: 96% 96%    Last Pain:  Vitals:   12/13/20 1122  TempSrc: Oral  PainSc:         RLE Motor Response: Purposeful movement (12/13/20 1548) RLE Sensation: Full sensation (12/13/20 1548)      Kennieth Rad

## 2020-12-13 NOTE — Plan of Care (Signed)

## 2020-12-13 NOTE — Progress Notes (Signed)
Orthopedic Tech Progress Note Patient Details:  Kenneth Cohen January 15, 1966 [id number]  Patient ID: Kenneth Cohen, male   DOB: 04-06-1965, 55 y.o.   MRN: [id number] Brace ordered. [name] 12/13/2020, 11:04 AM

## 2020-12-13 NOTE — Anesthesia Procedure Notes (Signed)
Anesthesia Regional Block: Popliteal block   Pre-Anesthetic Checklist: , timeout performed,  Correct Patient, Correct Site, Correct Laterality,  Correct Procedure, Correct Position, site marked,  Risks and benefits discussed,  Surgical consent,  Pre-op evaluation,  At surgeon's request and post-op pain management  Laterality: Right  Prep: chloraprep       Needles:  Injection technique: Single-shot  Needle Type: Echogenic Needle     Needle Length: 9cm  Needle Gauge: 21     Additional Needles:   Procedures:,,,, ultrasound used (permanent image in chart),,    Narrative:  Start time: 12/13/2020 9:12 AM End time: 12/13/2020 9:18 AM Injection made incrementally with aspirations every 5 mL.  Performed by: Personally  Anesthesiologist: Marcene Duos, MD

## 2020-12-13 NOTE — Interval H&P Note (Signed)
History and Physical Interval Note:  12/13/2020 7:42 AM  Kenneth Cohen  has presented today for surgery, with the diagnosis of OSTEOMYELITIS RIGHT FOOT.  The various methods of treatment have been discussed with the patient and family. After consideration of risks, benefits and other options for treatment, the patient has consented to  Procedure(s): AMPUTATION BELOW KNEE (Right) APPLICATION OF WOUND VAC (Right) as a surgical intervention.  The patient's history has been reviewed, patient examined, no change in status, stable for surgery.  I have reviewed the patient's chart and labs.  Questions were answered to the patient's satisfaction.     Nadara Mustard

## 2020-12-13 NOTE — Transfer of Care (Signed)
Immediate Anesthesia Transfer of Care Note  Patient: Kenneth Cohen  Procedure(s) Performed: AMPUTATION BELOW KNEE (Right: Knee) APPLICATION OF WOUND VAC (Right)  Patient Location: PACU  Anesthesia Type:General and Regional  Level of Consciousness: drowsy and patient cooperative  Airway & Oxygen Therapy: Patient Spontanous Breathing and Patient connected to face mask oxygen  Post-op Assessment: Report given to RN and Post -op Vital signs reviewed and stable  Post vital signs: Reviewed and stable  Last Vitals:  Vitals Value Taken Time  BP 115/64 12/13/20 1044  Temp    Pulse 87 12/13/20 1046  Resp 21 12/13/20 1046  SpO2 100 % 12/13/20 1046  Vitals shown include unvalidated device data.  Last Pain:  Vitals:   12/13/20 0733  TempSrc: Oral  PainSc:       Patients Stated Pain Goal: 2 (12/10/20 1551)  Complications: No notable events documented.

## 2020-12-13 NOTE — Op Note (Signed)
   Date of Surgery: 12/13/2020  INDICATIONS: Kenneth Cohen is a 55 y.o.-year-old male who presents with abscess osteomyelitis ulceration right foot.  PREOPERATIVE DIAGNOSIS: osteomyelitis abscess ulcer right foot  POSTOPERATIVE DIAGNOSIS: Same.  PROCEDURE: Transtibial amputation Application of Prevena wound VAC  SURGEON: Lajoyce Corners, M.D.  ANESTHESIA:  general  IV FLUIDS AND URINE: See anesthesia records.  ESTIMATED BLOOD LOSS: See anesthesia records.  COMPLICATIONS: None.  DESCRIPTION OF PROCEDURE: The patient was brought to the operating room after undergoing regional anesthetic. After adequate levels of anesthesia were obtained patient's lower extremity was prepped using DuraPrep draped into a sterile field. A timeout was called. The foot was draped out of the sterile field with impervious stockinette. A transverse incision was made 11 cm distal to the tibial tubercle. This curved proximally and a large posterior flap was created. The tibia was transected 1 cm proximal to the skin incision. The fibula was transected just proximal to the tibial incision. The tibia was beveled anteriorly. A large posterior flap was created. The sciatic nerve was pulled cut and allowed to retract. The vascular bundles were suture ligated with 2-0 silk. The deep and superficial fascial layers were closed using #1 Vicryl. The skin was closed using staples and 2-0 nylon. The wound was covered with a Prevena customizable and arthroform wound VAC.  The dressing was sealed with dermatac there was a good suction fit. A prosthetic shrinker and limb protector were applied. Patient was taken to the PACU in stable condition.   DISCHARGE PLANNING:  Antibiotic duration: 24 hours  Weightbearing: Nonweightbearing on the operative extremity  Pain medication: Opioid pathway  Dressing care/ Wound VAC: Continue wound VAC for 1 week after discharge  Discharge to: Discharge planning based on therapy's recommendations for  possible inpatient rehabilitation, outpatient rehabilitation, or discharge to home with therapy  Follow-up: In the office 1 week post operative.  Aldean Baker, MD [facility] 10:56 AM

## 2020-12-13 NOTE — Progress Notes (Signed)
Patient back from OR. Patient is A&O*4. Able to verbalize his needs to staff. NO CO apain at this time. Wound vac to right BKA surgical site. No drainage in wound vac noted. Bed kept in low position and locked. Will contd to monitor.

## 2020-12-13 NOTE — Anesthesia Procedure Notes (Signed)
Procedure Name: LMA Insertion Date/Time: 12/13/2020 9:59 AM Performed by: Audie Pinto, CRNA Pre-anesthesia Checklist: Patient identified, Emergency Drugs available, Suction available and Patient being monitored Patient Re-evaluated:Patient Re-evaluated prior to induction Oxygen Delivery Method: Circle system utilized Preoxygenation: Pre-oxygenation with 100% oxygen Induction Type: IV induction LMA: LMA inserted LMA Size: 5.0 Placement Confirmation: positive ETCO2 Dental Injury: Teeth and Oropharynx as per pre-operative assessment

## 2020-12-13 NOTE — Progress Notes (Signed)
Patient transported to OR by OR staff via bed at this time. Patient hemodynamically stable during transport.

## 2020-12-13 NOTE — Progress Notes (Signed)
PROGRESS NOTE    Kenneth Cohen  HCW:[id number] DOB: Jun 17, 1965 DOA: 12/09/2020 PCP: Doreene Nest, NP    Brief Narrative:  55 yo male with the past medical history of T2DM, HTN, left BKA and right foot wound who presented with right foot infection. Patient had right foot edema, erythema and tenderness for about 3 to 4 weeks, he was seen at a wound center and by her PCP who referred him to a podiatrist, subsequently had an MRI done on 11/14 was concerning for abscesses, septic joint and osteomyelitis, referred to the emergency room  -Presented to the ED 11/21  -Started on broad-spectrum antibiotics, seen by vascular surgery, recommended orthopedics evaluation   Assessment & Plan:   Right diabetic foot abscesses, septic joint and osteomyelitis -MRI completed prior to admission on 11/14 reviewed -Appreciate vascular surgery consultation-no concerns for PAD at this time -Continue broad-spectrum antibiotics -Orthopedics consulted, going down for BKA at this morning -ABIs were normal -Discontinue antibiotics tomorrow, labs in a.m.  AKI on CKD stage 2,  Hypovolemic hyponatremia.  -Resolved with hydration and supportive care  Iron deficiency anemia Anemia of chronic disease -Hemoglobin trending down to 7.4, continue IV iron day 2 -Will need follow-up work-up for iron deficiency anemia -Anticipate need for blood transfusion Intra-Op and postop  T2DM/ dyslipidemia.  -CBG stable on current regimen of Semglee, SSI -Follow-up hemoglobin A1c  HTN prolonged QTc  -BP stable, amlodipine HCTZ and olmesartan on hold.  Obesity -BMI 37.5   DVT prophylaxis: Enoxaparin   Code Status:    full  Family Communication:  Discussed with patient in detail, no family at bedside   Consultants:  Vascular surgery  Orthopedics   Antimicrobials:  Vancomycin and ceftriaxone     Subjective: -Going down for BKA now  Objective: Vitals:   12/13/20 1042 12/13/20 1057 12/13/20 1112 12/13/20  1122  BP: 115/64 114/70 118/70 103/74  Pulse: 87 83 79 81  Resp: 20 (!) 22 (!) 23 18  Temp: 97.9 F (36.6 C)  (!) 97.4 F (36.3 C) 98.2 F (36.8 C)  TempSrc:    Oral  SpO2: 100% 94% 96% 96%  Weight:      Height:        Intake/Output Summary (Last 24 hours) at 12/13/2020 1217 Last data filed at 12/13/2020 1041 Gross per 24 hour  Intake 1985 ml  Output 450 ml  Net 1535 ml   Filed Weights   12/09/20 1837 12/09/20 1932 12/10/20 0448  Weight: (!) 154.7 kg (!) 154.7 kg (!) 147.3 kg    Examination:   General: Gen: Awake, Alert, Oriented X 3,  HEENT: no JVD Lungs: Good air movement bilaterally, CTAB CVS: S1S2/RRR Abd: soft, Non tender, non distended, BS present Extremities:  Right foot with Charcot deformity, swelling tenderness and open wounds laterally with drainage, left BKA, Psych: Flat affect   Data Reviewed: I have personally reviewed following labs and imaging studies  CBC: Recent Labs  Lab 12/09/20 1711 12/10/20 0301 12/11/20 0158 12/12/20 0128 12/13/20 0304  WBC 9.9 8.9 6.6 5.8 5.2  NEUTROABS 7.6  --   --   --   --   HGB 8.9* 8.0* 7.6* 7.3* 7.4*  HCT 29.0* 26.2* 24.3* 23.9* 24.0*  MCV 81.5 81.6 80.5 80.5 80.5  PLT 490* 414* 349 325 323   Basic Metabolic Panel: Recent Labs  Lab 12/09/20 1711 12/10/20 0047 12/10/20 0301 12/11/20 0158 12/12/20 0128 12/13/20 0304  NA 129*  --  131* 134* 133* 134*  K 4.0  --  3.9 4.1 3.7 3.5  CL 93*  --  97* 101 100 102  CO2 24  --  20* 25 23 24   GLUCOSE 185*  --  165* 153* 131* 117*  BUN 21*  --  23* 14 11 9   CREATININE 1.33*  --  1.49* 1.05 0.99 0.89  CALCIUM 9.3  --  8.8* 9.0 8.6* 8.1*  MG  --  1.6*  --   --   --   --    GFR: Estimated Creatinine Clearance: 152.7 mL/min (by C-G formula based on SCr of 0.89 mg/dL). Liver Function Tests: Recent Labs  Lab 12/09/20 1711  AST 15  ALT 13  ALKPHOS 83  BILITOT 0.7  PROT 8.0  ALBUMIN 2.6*   No results for input(s): LIPASE, AMYLASE in the last 168 hours. No  results for input(s): AMMONIA in the last 168 hours. Coagulation Profile: Recent Labs  Lab 12/09/20 1711  INR 1.1   Cardiac Enzymes: No results for input(s): CKTOTAL, CKMB, CKMBINDEX, TROPONINI in the last 168 hours. BNP (last 3 results) No results for input(s): PROBNP in the last 8760 hours. HbA1C: No results for input(s): HGBA1C in the last 72 hours. CBG: Recent Labs  Lab 12/13/20 0404 12/13/20 0732 12/13/20 1022 12/13/20 1044 12/13/20 1124  GLUCAP 108* 113* 122* 129* 137*   Lipid Profile: No results for input(s): CHOL, HDL, LDLCALC, TRIG, CHOLHDL, LDLDIRECT in the last 72 hours. Thyroid Function Tests: No results for input(s): TSH, T4TOTAL, FREET4, T3FREE, THYROIDAB in the last 72 hours. Anemia Panel: No results for input(s): VITAMINB12, FOLATE, FERRITIN, TIBC, IRON, RETICCTPCT in the last 72 hours.   Radiology Studies: I have reviewed all of the imaging during this hospital visit personally  Scheduled Meds:  sodium chloride   Intravenous Once   sodium chloride   Intravenous Once   sodium chloride   Intravenous Once   vitamin C  1,000 mg Oral Daily   atorvastatin  20 mg Oral Daily   [START ON 12/14/2020] docusate sodium  100 mg Oral Daily   influenza vac split quadrivalent PF  0.5 mL Intramuscular Tomorrow-1000   insulin aspart  0-15 Units Subcutaneous Q4H   insulin glargine-yfgn  30 Units Subcutaneous BID   metFORMIN  1,000 mg Oral BID WC   nutrition supplement (JUVEN)  1 packet Oral BID BM   pantoprazole  40 mg Oral Daily   zinc sulfate  220 mg Oral Daily   Continuous Infusions:  sodium chloride     cefTRIAXone (ROCEPHIN)  IV 2 g (12/12/20 2216)   ferric gluconate (FERRLECIT) IVPB 250 mg (12/12/20 1341)   magnesium sulfate bolus IVPB     vancomycin     vancomycin 1,750 mg (12/13/20 0027)     LOS: 4 days   Domenic Polite, MD

## 2020-12-13 NOTE — Anesthesia Procedure Notes (Signed)
Anesthesia Regional Block: Adductor canal block   Pre-Anesthetic Checklist: , timeout performed,  Correct Patient, Correct Site, Correct Laterality,  Correct Procedure, Correct Position, site marked,  Risks and benefits discussed,  Surgical consent,  Pre-op evaluation,  At surgeon's request and post-op pain management  Laterality: Right  Prep: chloraprep       Needles:  Injection technique: Single-shot  Needle Type: Echogenic Needle     Needle Length: 9cm  Needle Gauge: 21     Additional Needles:   Procedures:,,,, ultrasound used (permanent image in chart),,    Narrative:  Start time: 12/13/2020 9:07 AM End time: 12/13/2020 9:12 AM Injection made incrementally with aspirations every 5 mL.  Performed by: Personally  Anesthesiologist: Marcene Duos, MD

## 2020-12-14 ENCOUNTER — Encounter (HOSPITAL_COMMUNITY): Payer: Self-pay | Admitting: Orthopedic Surgery

## 2020-12-14 DIAGNOSIS — E11628 Type 2 diabetes mellitus with other skin complications: Secondary | ICD-10-CM | POA: Diagnosis not present

## 2020-12-14 DIAGNOSIS — L089 Local infection of the skin and subcutaneous tissue, unspecified: Secondary | ICD-10-CM | POA: Diagnosis not present

## 2020-12-14 LAB — GLUCOSE, CAPILLARY
Glucose-Capillary: 150 mg/dL — ABNORMAL HIGH (ref 70–99)
Glucose-Capillary: 132 mg/dL — ABNORMAL HIGH (ref 70–99)
Glucose-Capillary: 153 mg/dL — ABNORMAL HIGH (ref 70–99)
Glucose-Capillary: 144 mg/dL — ABNORMAL HIGH (ref 70–99)
Glucose-Capillary: 131 mg/dL — ABNORMAL HIGH (ref 70–99)

## 2020-12-14 LAB — BASIC METABOLIC PANEL
Anion gap: 8 (ref 5–15)
BUN: 11 mg/dL (ref 6–20)
CO2: 23 mmol/L (ref 22–32)
Calcium: 7.9 mg/dL — ABNORMAL LOW (ref 8.9–10.3)
Chloride: 102 mmol/L (ref 98–111)
Creatinine, Ser: 0.95 mg/dL (ref 0.61–1.24)
GFR, Estimated: >60 mL/min (ref >60)
Glucose, Bld: 182 mg/dL — ABNORMAL HIGH (ref 70–99)
Potassium: 3.6 mmol/L (ref 3.5–5.1)
Sodium: 133 mmol/L — ABNORMAL LOW (ref 135–145)

## 2020-12-14 LAB — ZINC: Zinc: 64 ug/dL (ref 44–115)

## 2020-12-14 LAB — CULTURE, BLOOD (ROUTINE X 2)
Culture: NO GROWTH
Culture: NO GROWTH
Special Requests: ADEQUATE
Special Requests: ADEQUATE

## 2020-12-14 LAB — CBC
HCT: 24.1 % — ABNORMAL LOW (ref 39.0–52.0)
HCT: 27.6 % — ABNORMAL LOW (ref 39.0–52.0)
Hemoglobin: 7.6 g/dL — ABNORMAL LOW (ref 13.0–17.0)
Hemoglobin: 8.9 g/dL — ABNORMAL LOW (ref 13.0–17.0)
MCH: 25.9 pg — ABNORMAL LOW (ref 26.0–34.0)
MCH: 26.8 pg (ref 26.0–34.0)
MCHC: 31.5 g/dL (ref 30.0–36.0)
MCHC: 32.2 g/dL (ref 30.0–36.0)
MCV: 82 fL (ref 80.0–100.0)
MCV: 83.1 fL (ref 80.0–100.0)
Platelets: 318 10*3/uL (ref 150–400)
Platelets: 329 10*3/uL (ref 150–400)
RBC: 2.94 MIL/uL — ABNORMAL LOW (ref 4.22–5.81)
RBC: 3.32 MIL/uL — ABNORMAL LOW (ref 4.22–5.81)
RDW: 15.6 % — ABNORMAL HIGH (ref 11.5–15.5)
RDW: 15.7 % — ABNORMAL HIGH (ref 11.5–15.5)
WBC: 8.1 10*3/uL (ref 4.0–10.5)
WBC: 8.5 10*3/uL (ref 4.0–10.5)
nRBC: 0 % (ref 0.0–0.2)
nRBC: 0 % (ref 0.0–0.2)

## 2020-12-14 LAB — PREPARE RBC (CROSSMATCH)

## 2020-12-14 MED ORDER — SODIUM CHLORIDE 0.9% IV SOLUTION: Freq: Once | INTRAVENOUS | Status: DC

## 2020-12-14 MED ORDER — ENOXAPARIN SODIUM 80 MG/0.8ML IJ SOSY
75.0000 mg | PREFILLED_SYRINGE | INTRAMUSCULAR | Status: DC
  Filled 2020-12-14 (×2): qty 0.75

## 2020-12-14 NOTE — Evaluation (Signed)
Occupational Therapy Evaluation Patient Details Name: Kenneth Cohen MRN: [id number] DOB: 1965-07-24 Today's Date: 12/14/2020   History of Present Illness 55 y.o. M presenting to ED on 11/21 for worsening foot infection, s/p R transtibial amputation on 11/25. PMH includes DM, HTN, and L BKA   Clinical Impression   Pt independent with ADLs and used RW for mobility prior to admission. Pt lives in single story home with stairs to enter, has spouse available for assistance. Pt attempted sit to stand transfer with RW using LLE prosthesis, unable to balance in standing, performed lateral scoot transfer x4. Pt able to perform ADLs with supervision - min A while seated on commode. Educated pt on limb desensitization strategies and use of limb protector. Pt verbalized understanding. Pt motivated to return to PLOF, however limited by impaired balance, activity tolerance, strength, and ROM at this time. Will continue to follow acutely, recommend CIR at d/c.     Recommendations for follow up therapy are one component of a multi-disciplinary discharge planning process, led by the attending physician.  Recommendations may be updated based on patient status, additional functional criteria and insurance authorization.   Follow Up Recommendations  Acute inpatient rehab (3hours/day)    Assistance Recommended at Discharge Frequent or constant Supervision/Assistance  Functional Status Assessment  Patient has had a recent decline in their functional status and demonstrates the ability to make significant improvements in function in a reasonable and predictable amount of time.  Equipment Recommendations  Other (comment);BSC/3in1 (drop arm BSC, pt has all other DME)    Recommendations for Other Services PT consult     Precautions / Restrictions Precautions Precautions: Fall Restrictions Weight Bearing Restrictions: Yes RLE Weight Bearing: Non weight bearing      Mobility Bed Mobility Overal bed  mobility: Needs Assistance Bed Mobility: Rolling;Sit to Supine;Supine to Sit Rolling: Supervision   Supine to sit: Supervision;HOB elevated Sit to supine: Supervision;HOB elevated        Transfers Overall transfer level: Needs assistance Equipment used: Rolling walker (2 wheels) Transfers: Sit to/from Stand Sit to Stand: Mod assist;From elevated surface           General transfer comment: pt attempted sit to stand transfer x1, unable to balance in standing to move forward.      Balance Overall balance assessment: Needs assistance Sitting-balance support: No upper extremity supported Sitting balance-Leahy Scale: Fair     Standing balance support: During functional activity;Reliant on assistive device for balance;Bilateral upper extremity supported Standing balance-Leahy Scale: Poor Standing balance comment: unable to achieve balance in standing with RW                           ADL either performed or assessed with clinical judgement   ADL Overall ADL's : Needs assistance/impaired Eating/Feeding: Set up;Sitting   Grooming: Set up;Sitting   Upper Body Bathing: Supervision/ safety;Sitting   Lower Body Bathing: Set up;Sitting/lateral leans   Upper Body Dressing : Min guard;Sitting   Lower Body Dressing: Minimal assistance;Sitting/lateral leans   Toilet Transfer: Regular Toilet Toilet Transfer Details (indicate cue type and reason): scoot transfer from w/c         Functional mobility during ADLs: Supervision/safety;Wheelchair;Cueing for safety       Vision   Vision Assessment?: No apparent visual deficits     Perception     Praxis      Pertinent Vitals/Pain Pain Assessment: No/denies pain     Hand Dominance  Extremity/Trunk Assessment Upper Extremity Assessment Upper Extremity Assessment: Overall WFL for tasks assessed   Lower Extremity Assessment Lower Extremity Assessment: Defer to PT evaluation       Communication  Communication Communication: No difficulties   Cognition Arousal/Alertness: Awake/alert Behavior During Therapy: Impulsive;Anxious Overall Cognitive Status: Within Functional Limits for tasks assessed                                       General Comments  Educated pt on donning/doffing limb protector as well as desensitization techniques for pain.    Exercises     Shoulder Instructions      Home Living Family/patient expects to be discharged to:: Private residence Living Arrangements: Spouse/significant other Available Help at Discharge: Family Type of Home: House Home Access: Stairs to enter Secretary/administrator of Steps: 4   Home Layout: One level     Bathroom Shower/Tub: Chief Strategy Officer: Standard     Home Equipment: Pharmacist, hospital (2 wheels);Wheelchair - manual          Prior Functioning/Environment               Mobility Comments: uses RW for mobility prior to hospital admission          OT Problem List: Decreased strength;Decreased range of motion;Decreased activity tolerance;Impaired balance (sitting and/or standing);Pain;Decreased knowledge of use of DME or AE;Decreased knowledge of precautions;Decreased safety awareness      OT Treatment/Interventions: Self-care/ADL training;Therapeutic exercise;Neuromuscular education;DME and/or AE instruction;Patient/family education;Balance training;Therapeutic activities    OT Goals(Current goals can be found in the care plan section) Acute Rehab OT Goals Patient Stated Goal: return to PLOF OT Goal Formulation: With patient Time For Goal Achievement: 12/28/20 Potential to Achieve Goals: Good ADL Goals Pt Will Perform Upper Body Dressing: with supervision;sitting Pt Will Perform Lower Body Dressing: with supervision;sitting/lateral leans Pt Will Transfer to Toilet: bedside commode;with supervision;with transfer board Pt Will Perform Tub/Shower Transfer: shower  seat;with supervision  OT Frequency: Min 2X/week   Barriers to D/C:            Co-evaluation              AM-PAC OT "6 Clicks" Daily Activity     Outcome Measure Help from another person eating meals?: None Help from another person taking care of personal grooming?: None Help from another person toileting, which includes using toliet, bedpan, or urinal?: A Little Help from another person bathing (including washing, rinsing, drying)?: A Little Help from another person to put on and taking off regular upper body clothing?: A Little Help from another person to put on and taking off regular lower body clothing?: A Little 6 Click Score: 20   End of Session Equipment Utilized During Treatment: Gait belt;Rolling walker (2 wheels);Other (comment) (w/c) Nurse Communication: Mobility status  Activity Tolerance: Patient tolerated treatment well Patient left: in bed;with bed alarm set;with call bell/phone within reach  OT Visit Diagnosis: Unsteadiness on feet (R26.81);Other abnormalities of gait and mobility (R26.89);Muscle weakness (generalized) (M62.81);Pain                Time: 6301-6010 OT Time Calculation (min): 61 min Charges:  OT General Charges $OT Visit: 1 Visit OT Evaluation $OT Eval Moderate Complexity: 1 Mod OT Treatments $Self Care/Home Management : 38-52 mins  Alfonzo Beers, OTD, OTR/L Acute Rehab [phone]   Mayer Masker 12/14/2020, 9:05 AM

## 2020-12-14 NOTE — Progress Notes (Signed)
Inpatient Rehab Admissions Coordinator:   I spoke with pt. And wife regarding potential CIR admit. Pt. States that he feels like he wants to go home with [facility] and wife states that she and other family can manage Pt. At current level of assistance; however, he would like to think about it overnight. I will touch base with pt. In the morning.   Megan Salon, MS, CCC-SLP Rehab Admissions Coordinator  [phone] (celll) [phone] (office)

## 2020-12-14 NOTE — Evaluation (Signed)
Physical Therapy Evaluation Patient Details Name: Kenneth Cohen MRN: [id number] DOB: Mar 19, 1965 Today's Date: 12/14/2020  History of Present Illness  Pt. is 55 yr old M admitted on 12/09/20 after presenting with concerns of R foot abcess/infection. Imaging of R foot (+) for OM and sepctic joints. Underwent R transtib amputation on 11/25.  PMH: diabetic charcot foot, HTN, DM, L BKA  Clinical Impression  Pt was previously mod I with all functional mobility with L LE prosthesis.  S/p R BKA, pt is mod I with bed mobility and is requiring CGA for safety with lateral scoot transfers into chair and mod A with sit > stand transfers.  Pt would be excellent candidate for CIR, but is refusing inpatient rehab at this time and would prefer to return home.  Pt has family support necessary to safely return home, and has most necessary equipment with the exception of a slide board.  Pt would benefit from skilled PT in acute care to address deficits indicated in initial eval, including independence with lateral scoot transfers.  PT to bring drop arm chair to room next tx session to continue to practice transfers.       Recommendations for follow up therapy are one component of a multi-disciplinary discharge planning process, led by the attending physician.  Recommendations may be updated based on patient status, additional functional criteria and insurance authorization.  Follow Up Recommendations Home health PT (Pt. would be excellent CIR candidate but is refusing.  Wants to return home with [facility] PT.)    Assistance Recommended at Discharge Intermittent Supervision/Assistance  Functional Status Assessment Patient has had a recent decline in their functional status and demonstrates the ability to make significant improvements in function in a reasonable and predictable amount of time.  Equipment Recommendations  Other (comment) (Pt. would benefit from slide board.  Pt. has w/c, RW and shower seat)     Recommendations for Other Services       Precautions / Restrictions Precautions Precautions: Fall Required Braces or Orthoses: Other Brace Other Brace: Stump protector on R Restrictions Weight Bearing Restrictions: Yes RLE Weight Bearing: Non weight bearing      Mobility  Bed Mobility Overal bed mobility: Modified Independent Bed Mobility: Rolling;Sit to Supine;Supine to Sit Rolling: Supervision   Supine to sit: Modified independent (Device/Increase time) Sit to supine: Modified independent (Device/Increase time)   General bed mobility comments: Mod I with use of rails, PT helps to manage lines. Patient Response: Cooperative  Transfers Overall transfer level: Needs assistance Equipment used: Rolling walker (2 wheels) Transfers: Sit to/from Stand;Bed to chair/wheelchair/BSC Sit to Stand: Mod assist          Lateral/Scoot Transfers: Min guard General transfer comment: Pt. performs 1 sit > stand with L prosthetic, mod A from PT and spouse assisting to stabilize RW.  Able to maintain standing balance x 1 min.  Pt. then practices lateral scoot transfer into transport chair and then BTB with CGA from PT to stabilize chair and manage lines.  Pt. takes inc time to complete transfer and req's VC x 1 not to put any weight through R BKA.    Ambulation/Gait                  Stairs Stairs: Yes (Education provided on w/c transfer up steps.  Provided with handout. Demos understanding)          Wheelchair Mobility    Modified Rankin (Stroke Patients Only)       Balance Overall balance  assessment: Needs assistance Sitting-balance support: No upper extremity supported Sitting balance-Leahy Scale: Good     Standing balance support: During functional activity;Reliant on assistive device for balance;Bilateral upper extremity supported Standing balance-Leahy Scale: Poor Standing balance comment: unable to achieve balance in standing with RW                              Pertinent Vitals/Pain Pain Assessment: 0-10 Pain Score: 6  Pain Location: R LE Pain Descriptors / Indicators: Aching;Discomfort;Throbbing Pain Intervention(s): Limited activity within patient's tolerance;Monitored during session    Home Living Family/patient expects to be discharged to:: Private residence Living Arrangements: Spouse/significant other Available Help at Discharge: Family;Friend(s) Type of Home: House Home Access: Stairs to enter Entrance Stairs-Rails: None Entrance Stairs-Number of Steps: 4   Home Layout: One level Home Equipment: Agricultural consultant (2 wheels);Wheelchair - Lawyer Comments: Family is building ramp    Prior Function Prior Level of Function : Independent/Modified Independent             Mobility Comments: uses RW for mobility prior to hospital admission       Hand Dominance        Extremity/Trunk Assessment   Upper Extremity Assessment Upper Extremity Assessment: Defer to OT evaluation    Lower Extremity Assessment Lower Extremity Assessment: Overall WFL for tasks assessed       Communication   Communication: No difficulties  Cognition Arousal/Alertness: Awake/alert Behavior During Therapy: WFL for tasks assessed/performed Overall Cognitive Status: Within Functional Limits for tasks assessed                                 General Comments: Alert and oriented x 3.  Agreeable to work with PT        General Comments General comments (skin integrity, edema, etc.): Educated pt on donning/doffing limb protector as well as desensitization techniques for pain.    Exercises     Assessment/Plan    PT Assessment Patient needs continued PT services  PT Problem List Decreased strength;Decreased mobility;Decreased safety awareness;Decreased range of motion;Decreased knowledge of precautions;Decreased activity tolerance;Decreased balance;Decreased knowledge of use of DME;Pain        PT Treatment Interventions DME instruction;Therapeutic exercise;Gait training;Balance training;Wheelchair mobility training;Stair training;Functional mobility training;Therapeutic activities;Patient/family education    PT Goals (Current goals can be found in the Care Plan section)  Acute Rehab PT Goals Patient Stated Goal: Pt's goal is to return to independence, wants to be able to walk again. PT Goal Formulation: With patient/family Time For Goal Achievement: 12/27/20 Potential to Achieve Goals: Good    Frequency Min 3X/week   Barriers to discharge   Steps - but pt. has been instructed on w/c transfers.  States he has available support to assist with transfer and family will be building a ramp.    Co-evaluation               AM-PAC PT "6 Clicks" Mobility  Outcome Measure Help needed turning from your back to your side while in a flat bed without using bedrails?: None Help needed moving from lying on your back to sitting on the side of a flat bed without using bedrails?: None Help needed moving to and from a bed to a chair (including a wheelchair)?: A Little Help needed standing up from a chair using your arms (e.g., wheelchair or bedside chair)?: A Lot Help needed  to walk in hospital room?: Total Help needed climbing 3-5 steps with a railing? : Total 6 Click Score: 15    End of Session Equipment Utilized During Treatment:  (Pt. refuses gait belt) Activity Tolerance: Patient tolerated treatment well Patient left: in bed;with call bell/phone within reach;with bed alarm set   PT Visit Diagnosis: Other abnormalities of gait and mobility (R26.89);Pain Pain - Right/Left: Right Pain - part of body: Leg    Time: 4008-6761 PT Time Calculation (min) (ACUTE ONLY): 43 min   Charges:   PT Evaluation $PT Eval Low Complexity: 1 Low PT Treatments $Therapeutic Activity: 23-37 mins        Tifani Dack A. Shaun Zuccaro, PT, DPT Acute Rehabilitation Services Office: [phone]    Tyquasia Pant A Adesuwa Osgood 12/14/2020, 11:18 AM

## 2020-12-14 NOTE — Progress Notes (Signed)
   Subjective:  Patient reports pain as mild.  All his pain is very well controlled at this time.  Wound VAC is holding suction with approximately 150 cc in the canister.  Pending physical therapy  Objective:   VITALS:   Vitals:   12/14/20 0531 12/14/20 0540 12/14/20 1016 12/14/20 1032  BP: 105/76 102/68 102/61 106/72  Pulse: 86 88 91 92  Resp: 20 20 18 18   Temp: 98.5 F (36.9 C) 98.6 F (37 C) 98 F (36.7 C) 98 F (36.7 C)  TempSrc: Oral  Oral Oral  SpO2: 97%  96% 99%  Weight:      Height:        Wound VAC is holding suction.  Wound VAC canister is serosanguineous fluid approximately 150 cc.  Stump protector in place.   Lab Results  Component Value Date   WBC 8.1 12/14/2020   HGB 7.6 (L) 12/14/2020   HCT 24.1 (L) 12/14/2020   MCV 82.0 12/14/2020   PLT 318 12/14/2020     Assessment/Plan:  1 Day Post-Op status post right knee BKA  -Continue to assess wound VAC output, holding suction -Appreciate physical therapy for further mobility assessment -Appreciate medical care  [facility] 12/14/2020, 10:57 AM

## 2020-12-14 NOTE — Progress Notes (Signed)
6 minutes following start of blood transfusion patient reports pain to L FA iv site. Transfusion paused. IV site assessed. Upon palpation firm rope like area noted. Patient also reports pain upon palpation. Transfusion disconnected. MD notified. Removed IV and notified blood bank. Blood returned to blood bank. Patient denies any other symptoms at this time. VSS. Will CTM.

## 2020-12-14 NOTE — Progress Notes (Signed)
PROGRESS NOTE    Kenneth Cohen  OEH:[id number] DOB: [birth year] DOA: 12/09/2020 PCP: Doreene Nest, NP    Brief Narrative:  55 yo male with the past medical history of T2DM, HTN, left BKA and right foot wound who presented with right foot infection. Patient had right foot edema, erythema and tenderness for about 3 to 4 weeks, he was seen at a wound center and by her PCP who referred him to a podiatrist, subsequently had an MRI done on 11/14 was concerning for abscesses, septic joint and osteomyelitis, referred to the emergency room  -Presented to the ED 11/21  -Started on broad-spectrum antibiotics, seen by vascular surgery, recommended orthopedics evaluation   Assessment & Plan:   Right diabetic foot abscesses, septic joint and osteomyelitis -MRI completed prior to admission on 11/14 reviewed -Appreciate vascular surgery consultation-no concerns for PAD at this time -Orthopedics consulted, underwent BKA, wound VAC placement yesterday, 2 units PRBC transfused Intra-Op -ABIs were normal -Discontinue antibiotics after today's labs -CIR consulting, may need additional unit of blood today Discharge planning  AKI on CKD stage 2,  Hypovolemic hyponatremia.  -Resolved with hydration and supportive care  Iron deficiency anemia Anemia of chronic disease -Given IV iron this admission -Will need follow-up work-up for iron deficiency anemia -Intra-Op transfused 2 units of PRBC yesterday, hemoglobin 7.4 this morning, recheck this evening may need additional transfusion  T2DM/ dyslipidemia.  -CBG stable on current regimen of Semglee, SSI  HTN prolonged QTc  -BP stable, amlodipine HCTZ and olmesartan on hold.  Obesity -BMI 37.5   DVT prophylaxis: Enoxaparin   Code Status:    full  Family Communication:  Discussed with patient in detail, wife at bedside Disposition?  CIR  Consultants:  Vascular surgery  Orthopedics   Antimicrobials:  Vancomycin and ceftriaxone      Subjective: -Wondering when he can go home, underwent BKA yesterday  Objective: Vitals:   12/14/20 0531 12/14/20 0540 12/14/20 1016 12/14/20 1032  BP: 105/76 102/68 102/61 106/72  Pulse: 86 88 91 92  Resp: 20 20 18 18   Temp: 98.5 F (36.9 C) 98.6 F (37 C) 98 F (36.7 C) 98 F (36.7 C)  TempSrc: Oral  Oral Oral  SpO2: 97%  96% 99%  Weight:      Height:        Intake/Output Summary (Last 24 hours) at 12/14/2020 1201 Last data filed at 12/14/2020 0825 Gross per 24 hour  Intake 2433.02 ml  Output 1100 ml  Net 1333.02 ml   Filed Weights   12/09/20 1932 12/10/20 0448 12/14/20 0500  Weight: (!) 154.7 kg (!) 147.3 kg (!) 149.1 kg    Examination:   General: Gen: Awake, Alert, Oriented X 3,  HEENT: no JVD Lungs: Good air movement bilaterally, CTAB CVS: S1S2/RRR Abd: soft, Non tender, non distended, BS present Extremities: : Fresh right BKA with stump protector, wound VAC, left BKA, Psych: Flat affect   Data Reviewed: I have personally reviewed following labs and imaging studies  CBC: Recent Labs  Lab 12/09/20 1711 12/10/20 0301 12/11/20 0158 12/12/20 0128 12/13/20 0304 12/13/20 2057 12/14/20 0122  WBC 9.9 8.9 6.6 5.8 5.2  --  8.1  NEUTROABS 7.6  --   --   --   --   --   --   HGB 8.9* 8.0* 7.6* 7.3* 7.4* 8.5* 7.6*  HCT 29.0* 26.2* 24.3* 23.9* 24.0* 26.3* 24.1*  MCV 81.5 81.6 80.5 80.5 80.5  --  82.0  PLT 490* 414* 349 325  323  --  0000000   Basic Metabolic Panel: Recent Labs  Lab 12/10/20 0047 12/10/20 0301 12/11/20 0158 12/12/20 0128 12/13/20 0304 12/14/20 0122  NA  --  131* 134* 133* 134* 133*  K  --  3.9 4.1 3.7 3.5 3.6  CL  --  97* 101 100 102 102  CO2  --  20* 25 23 24 23   GLUCOSE  --  165* 153* 131* 117* 182*  BUN  --  23* 14 11 9 11   CREATININE  --  1.49* 1.05 0.99 0.89 0.95  CALCIUM  --  8.8* 9.0 8.6* 8.1* 7.9*  MG 1.6*  --   --   --   --   --    GFR: Estimated Creatinine Clearance: 144 mL/min (by C-G formula based on SCr of 0.95  mg/dL). Liver Function Tests: Recent Labs  Lab 12/09/20 1711  AST 15  ALT 13  ALKPHOS 83  BILITOT 0.7  PROT 8.0  ALBUMIN 2.6*   No results for input(s): LIPASE, AMYLASE in the last 168 hours. No results for input(s): AMMONIA in the last 168 hours. Coagulation Profile: Recent Labs  Lab 12/09/20 1711  INR 1.1   Cardiac Enzymes: No results for input(s): CKTOTAL, CKMB, CKMBINDEX, TROPONINI in the last 168 hours. BNP (last 3 results) No results for input(s): PROBNP in the last 8760 hours. HbA1C: No results for input(s): HGBA1C in the last 72 hours. CBG: Recent Labs  Lab 12/13/20 2255 12/13/20 2348 12/14/20 0404 12/14/20 0842 12/14/20 1143  GLUCAP 177* 180* 150* 132* 153*   Lipid Profile: No results for input(s): CHOL, HDL, LDLCALC, TRIG, CHOLHDL, LDLDIRECT in the last 72 hours. Thyroid Function Tests: No results for input(s): TSH, T4TOTAL, FREET4, T3FREE, THYROIDAB in the last 72 hours. Anemia Panel: No results for input(s): VITAMINB12, FOLATE, FERRITIN, TIBC, IRON, RETICCTPCT in the last 72 hours.   Radiology Studies: I have reviewed all of the imaging during this hospital visit personally  Scheduled Meds:  sodium chloride   Intravenous Once   vitamin C  1,000 mg Oral Daily   atorvastatin  20 mg Oral Daily   docusate sodium  100 mg Oral Daily   influenza vac split quadrivalent PF  0.5 mL Intramuscular Tomorrow-1000   insulin aspart  0-15 Units Subcutaneous Q4H   insulin glargine-yfgn  30 Units Subcutaneous BID   metFORMIN  1,000 mg Oral BID WC   nutrition supplement (JUVEN)  1 packet Oral BID BM   pantoprazole  40 mg Oral Daily   zinc sulfate  220 mg Oral Daily   Continuous Infusions:  cefTRIAXone (ROCEPHIN)  IV Stopped (12/13/20 2333)   ferric gluconate (FERRLECIT) IVPB Stopped (12/14/20 [id number])   magnesium sulfate bolus IVPB     vancomycin Stopped (12/14/20 0224)     LOS: 5 days   Domenic Polite, MD

## 2020-12-15 DIAGNOSIS — E11628 Type 2 diabetes mellitus with other skin complications: Secondary | ICD-10-CM | POA: Diagnosis not present

## 2020-12-15 DIAGNOSIS — L089 Local infection of the skin and subcutaneous tissue, unspecified: Secondary | ICD-10-CM | POA: Diagnosis not present

## 2020-12-15 LAB — BPAM RBC
Blood Product Expiration Date: 202211262359
Blood Product Expiration Date: 202211262359
Blood Product Expiration Date: 202212162359
Blood Product Expiration Date: 202212202359
Blood Product Expiration Date: 202212282359
ISSUE DATE / TIME: 202211250946
ISSUE DATE / TIME: 202211251636
ISSUE DATE / TIME: 202211260509
ISSUE DATE / TIME: 202211260822
ISSUE DATE / TIME: 202211261007
Unit Type and Rh: 600
Unit Type and Rh: 600
Unit Type and Rh: 600
Unit Type and Rh: 600
Unit Type and Rh: 600

## 2020-12-15 LAB — TYPE AND SCREEN
ABO/RH(D): A NEG
Antibody Screen: NEGATIVE
Unit division: 0
Unit division: 0
Unit division: 0
Unit division: 0
Unit division: 0

## 2020-12-15 LAB — BASIC METABOLIC PANEL
Anion gap: 9 (ref 5–15)
BUN: 12 mg/dL (ref 6–20)
CO2: 21 mmol/L — ABNORMAL LOW (ref 22–32)
Calcium: 7.8 mg/dL — ABNORMAL LOW (ref 8.9–10.3)
Chloride: 100 mmol/L (ref 98–111)
Creatinine, Ser: 0.95 mg/dL (ref 0.61–1.24)
GFR, Estimated: >60 mL/min (ref >60)
Glucose, Bld: 129 mg/dL — ABNORMAL HIGH (ref 70–99)
Potassium: 3.5 mmol/L (ref 3.5–5.1)
Sodium: 130 mmol/L — ABNORMAL LOW (ref 135–145)

## 2020-12-15 LAB — CBC
HCT: 28.3 % — ABNORMAL LOW (ref 39.0–52.0)
Hemoglobin: 8.9 g/dL — ABNORMAL LOW (ref 13.0–17.0)
MCH: 26.5 pg (ref 26.0–34.0)
MCHC: 31.4 g/dL (ref 30.0–36.0)
MCV: 84.2 fL (ref 80.0–100.0)
Platelets: 351 10*3/uL (ref 150–400)
RBC: 3.36 MIL/uL — ABNORMAL LOW (ref 4.22–5.81)
RDW: 16.1 % — ABNORMAL HIGH (ref 11.5–15.5)
WBC: 7.7 10*3/uL (ref 4.0–10.5)
nRBC: 0 % (ref 0.0–0.2)

## 2020-12-15 LAB — GLUCOSE, CAPILLARY
Glucose-Capillary: 120 mg/dL — ABNORMAL HIGH (ref 70–99)
Glucose-Capillary: 123 mg/dL — ABNORMAL HIGH (ref 70–99)
Glucose-Capillary: 130 mg/dL — ABNORMAL HIGH (ref 70–99)
Glucose-Capillary: 150 mg/dL — ABNORMAL HIGH (ref 70–99)

## 2020-12-15 MED ORDER — ACETAMINOPHEN 325 MG PO TABS
325.0000 mg | ORAL_TABLET | Freq: Four times a day (QID) | ORAL | Status: DC | PRN
Start: 2020-12-15 — End: 2021-04-22

## 2020-12-15 MED ORDER — OXYCODONE HCL 5 MG PO TABS: 5.0000 mg | ORAL_TABLET | Freq: Four times a day (QID) | ORAL | 0 refills | Status: DC | PRN

## 2020-12-15 MED ORDER — POLYETHYLENE GLYCOL 3350 17 G PO PACK: 17.0000 g | PACK | Freq: Every day | ORAL | 0 refills | Status: DC | PRN

## 2020-12-15 MED ORDER — FERROUS SULFATE 325 (65 FE) MG PO TBEC: 325.0000 mg | DELAYED_RELEASE_TABLET | Freq: Two times a day (BID) | ORAL | 1 refills | Status: DC

## 2020-12-15 NOTE — Progress Notes (Signed)
    Durable Medical Equipment  (From admission, onward)           Start     Ordered   12/15/20 1047  For home use only DME standard manual wheelchair with seat cushion  Once       Comments: Patient suffers from amputation which impairs their ability to perform daily activities like bathing, dressing, and toileting in the home.  A walker will not resolve issue with performing activities of daily living. A wheelchair will allow patient to safely perform daily activities. Patient can safely propel the wheelchair in the home or has a caregiver who can provide assistance. Length of need 6 months. Heavy Duty.  Accessories: elevating leg rests (ELRs), wheel locks, extensions and anti-tippers, leg extenders.   12/15/20 1047

## 2020-12-15 NOTE — Discharge Summary (Addendum)
Physician Discharge Summary  Kenneth Cohen [id number] DOB: 1965-05-12 DOA: 12/09/2020  PCP: Pleas Koch, NP  Admit date: 12/09/2020 Discharge date: 12/15/2020  Time spent: 35 minutes  Recommendations for Outpatient Follow-up:  Orthopedics Dr. Sharol Given in 1 week, patient has wound VAC PCP in 2 weeks for iron deficiency anemia, needs gastroenterology work-up Check CBC in 1 to 2 weeks Home health PT OT, declined CIR for rehab   Discharge Diagnoses:  Principal Problem:   Diabetic infection of right foot ([address]) New right BKA   Essential hypertension   Diabetes mellitus type 2 with complications ([place])   Subacute osteomyelitis, right ankle and foot ([place])   Hyponatremia   CKD (chronic kidney disease), stage II   Prolonged QT interval   Charcot's joint arthropathy in type 2 diabetes mellitus ([place])   Severe protein-calorie malnutrition ([place]) Left BKA  Discharge Condition: Stable  Diet recommendation: Diabetic, low-sodium  Filed Weights   12/10/20 0448 12/14/20 0500 12/15/20 0500  Weight: (!) 147.3 kg (!) 149.1 kg (!) 147.9 kg    History of present illness:  55 yo male with the past medical history of T2DM, HTN, left BKA and right foot wound who presented with right foot infection. Patient had right foot edema, erythema and tenderness for about 3 to 4 weeks, he was seen at a wound center and by her PCP who referred him to a podiatrist, subsequently had an MRI done on 11/14 was concerning for abscesses, septic joint and osteomyelitis, referred to the emergency room  -Presented to the ED 11/21 with worsening symptoms  Hospital Course:   Right diabetic foot abscesses, septic joint and osteomyelitis -MRI completed prior to admission on 11/14 reviewed -Seen by vascular surgery and consultation-no concerns for PAD at this time -Orthopedics consulted, underwent BKA, wound VAC placement 11/25, 2 units PRBC transfused Intra-Op and 1 unit postop -ABIs were normal -Antibiotics  discontinued postoperatively -PT OT eval completed, CIR recommended for short-term rehab, patient adamantly declined this, he will be discharged home with home health services and family support -Will need close orthopedics follow-up with Dr. Sharol Given in 1 week   AKI on CKD stage 2,  Hypovolemic hyponatremia.  -Resolved with hydration and supportive care   Iron deficiency anemia Anemia of chronic disease -Given IV iron this admission -Will need follow-up work-up for iron deficiency anemia -Intra-Op transfused 2 units of PRBC and 1 unit yesterday. -Hemoglobin 8.9 at discharge, will add oral iron, will need gastroenterology follow-up for further work-up for iron deficiency anemia   T2DM/ dyslipidemia.  -CBG stable, hemoglobin A1c was 6.5 prior to admission, on home regimen of Novolin and and Novolin R resumed at discharge, weight loss emphasized   HTN prolonged QTc  -BP stable, amlodipine HCTZ  resumed, ARB held, can resume at FU if BP is stable   Obesity -BMI 37.5, advised to continue diet, lifestyle modification  Discharge Exam: Vitals:   12/15/20 0410 12/15/20 0810  BP: 114/79 124/81  Pulse: 96 100  Resp: 20 17  Temp: 98.4 F (36.9 C) 98.6 F (37 C)  SpO2: 95% 99%   General: Gen: Awake, Alert, Oriented X 3,  HEENT: no JVD Lungs: Good air movement bilaterally, CTAB CVS: S1S2/RRR Abd: soft, Non tender, non distended, BS present Extremities: : Fresh right BKA with stump protector, wound VAC, left BKA, Psych: Flat affect  Discharge Instructions   Discharge Instructions     Diet - low sodium heart healthy   Complete by: As directed    Diet Carb  Modified   Complete by: As directed    Discharge wound care:   Complete by: As directed    Wound VAC and close FU with Dr.Duda in 1 week   Increase activity slowly   Complete by: As directed    Negative Pressure Wound Therapy - Incisional   Complete by: As directed       Allergies as of 12/15/2020       Reactions    Cefaclor Itching, Swelling, Other (See Comments)   Reaction:  All-over body swelling  Did receive and tolerate cefazolin on adm 06/2016   Iodinated Diagnostic Agents Hives, Itching, Other (See Comments)   Immediately after receiving the iv contrast, patient started itching and developed welts on his forehead, back of head, chest and back.  ER Doctor and RN were notified and patient was told he should always be premedicated prior to receiving IV contrast.        Medication List     STOP taking these medications    clindamycin 300 MG capsule Commonly known as: CLEOCIN   olmesartan 40 MG tablet Commonly known as: BENICAR   sildenafil 20 MG tablet Commonly known as: REVATIO   terbinafine 250 MG tablet Commonly known as: LAMISIL       TAKE these medications    acetaminophen 325 MG tablet Commonly known as: TYLENOL Take 1-2 tablets (325-650 mg total) by mouth every 6 (six) hours as needed for mild pain (pain score 1-3 or temp > 100.5).   amLODipine 10 MG tablet Commonly known as: NORVASC Take 1 tablet (10 mg total) by mouth daily. For blood pressure.   atorvastatin 20 MG tablet Commonly known as: LIPITOR Take 1 tablet (20 mg total) by mouth daily. For cholesterol.   BC HEADACHE POWDER PO Take 1 packet by mouth daily as needed (for headaches).   Dexcom G6 Sensor Misc 1 each by Does not apply route as directed. What changed:  how much to take how to take this when to take this   Dexcom G6 Transmitter Misc 1 each by Does not apply route as directed.   ferrous sulfate 325 (65 FE) MG EC tablet Take 1 tablet (325 mg total) by mouth 2 (two) times daily.   fluticasone 50 MCG/ACT nasal spray Commonly known as: FLONASE Place 2 sprays into both nostrils daily. What changed:  when to take this reasons to take this   hydrochlorothiazide 25 MG tablet Commonly known as: HYDRODIURIL Take 1 tablet (25 mg total) by mouth daily. For blood pressure.   insulin NPH Human 100  UNIT/ML injection Commonly known as: NovoLIN N ReliOn Inject 50 units into the skin twice daily. What changed:  how much to take when to take this   insulin regular 100 units/mL injection Commonly known as: NovoLIN R ReliOn Inject 40 units into the skin three times daily before meals for diabetes.   metFORMIN 500 MG tablet Commonly known as: GLUCOPHAGE Take 2 tablets (1,000 mg total) by mouth 2 (two) times daily with a meal. For diabetes.   oxyCODONE 5 MG immediate release tablet Commonly known as: Oxy IR/ROXICODONE Take 1-2 tablets (5-10 mg total) by mouth every 6 (six) hours as needed for moderate pain or severe pain.   polyethylene glycol 17 g packet Commonly known as: MIRALAX / GLYCOLAX Take 17 g by mouth daily as needed for mild constipation.               Durable Medical Equipment  (From admission, onward)  Start     Ordered   12/15/20 1059  For home use only DME Other see comment  Once       Comments: Slide board  Question:  Length of Need  Answer:  6 Months   12/15/20 1058   12/15/20 1047  For home use only DME standard manual wheelchair with seat cushion  Once       Comments: Patient suffers from amputation which impairs their ability to perform daily activities like bathing, dressing, and toileting in the home.  A walker will not resolve issue with performing activities of daily living. A wheelchair will allow patient to safely perform daily activities. Patient can safely propel the wheelchair in the home or has a caregiver who can provide assistance. Length of need 6 months. Heavy Duty.  Accessories: elevating leg rests (ELRs), wheel locks, extensions and anti-tippers, leg extenders.   12/15/20 1047              Discharge Care Instructions  (From admission, onward)           Start     Ordered   12/15/20 0000  Discharge wound care:       Comments: Wound VAC and close FU with Dr.Duda in 1 week   12/15/20 0906            Allergies  Allergen Reactions   Cefaclor Itching, Swelling and Other (See Comments)    Reaction:  All-over body swelling   Did receive and tolerate cefazolin on adm 06/2016    Iodinated Diagnostic Agents Hives, Itching and Other (See Comments)    Immediately after receiving the iv contrast, patient started itching and developed welts on his forehead, back of head, chest and back.  ER Doctor and RN were notified and patient was told he should always be premedicated prior to receiving IV contrast.    Follow-up Information     Newt Minion, MD Follow up in 1 week(s).   Specialty: Orthopedic Surgery Contact information: [place] [address] [phone]         Pleas Koch, NP Follow up in 2 week(s).   Specialty: Internal Medicine Contact information: [place] [address] [phone]         [facility] Follow up.   Specialty: [place] Why: the office will call to schedule home health visits Contact information: [place] [address] [phone]                  The results of significant diagnostics from this hospitalization (including imaging, microbiology, ancillary and laboratory) are listed below for reference.    Significant Diagnostic Studies: DG Chest 2 View  Result Date: 12/09/2020 CLINICAL DATA:  Weakness EXAM: CHEST - 2 VIEW COMPARISON:  None. FINDINGS: The heart size and mediastinal contours are within normal limits. Both lungs are clear. The visualized skeletal structures are unremarkable. IMPRESSION: No active cardiopulmonary disease. Electronically Signed   By: Donavan Foil M.D.   On: 12/09/2020 18:09   MR FOOT RIGHT WO CONTRAST  Result Date: 12/02/2020 CLINICAL DATA:  Diabetic foot ulcer EXAM: MRI OF THE RIGHT FOREFOOT WITHOUT CONTRAST TECHNIQUE: Multiplanar, multisequence MR imaging of the right forefoot was performed. No intravenous contrast  was administered. COMPARISON:  None. FINDINGS: Soft tissue ulceration along the lateral aspect of the forefoot at the level of the fifth metatarsal shaft. There is underlying abscess within the soft tissues overlying the  fifth metatarsal measuring approximately 7.0 x 1.0 x 5.0 cm. Destructive changes of the fifth metatarsal head and neck as well as the base of the fifth toe proximal phalanx. Confluent low T1 signal changes throughout the fifth metatarsal and base of the fifth toe proximal phalanx compatible with acute osteomyelitis. Bone marrow edema within the second, third, and fourth metatarsals with confluent low T1 signal and destructive changes at the bases and proximal diaphyses of each bone compatible with osteomyelitis. Extensive fragmentation throughout the midfoot with diffuse confluent low T1 signal changes suggest a combination of neuropathic/Charcot arthropathy and acute osteomyelitis. Talonavicular dislocation. Widened Lisfranc interval. Complex midfoot joint effusions likely septic arthritis. Bone marrow edema at the base of the first metatarsal with small cortical erosions, also suggesting osteomyelitis. Large fluid collection predominantly at the dorsal aspect of the medial midfoot and forefoot measuring approximately 11.2 x 3.8 x 6.9 cm. Diffuse soft tissue swelling throughout the foot. Partially visualized small tibiotalar joint effusion. Appearance of the musculature suggesting combination of denervation and myositis. IMPRESSION: 1. Soft tissue ulceration along the lateral aspect of the forefoot at the level of the fifth metatarsal shaft with underlying abscess measuring 7.0 x 1.0 x 5.0 cm. 2. Acute osteomyelitis of the fifth metatarsal and base of the fifth toe proximal phalanx. 3. Acute osteomyelitis of the second, third, and fourth metatarsals as well as the first metatarsal base. 4. Appearance of the midfoot suggesting combination of extensive osteomyelitis and Charcot arthropathy. Complex  midfoot joint effusions likely septic arthritis. 5. Large fluid collection predominantly at the dorsal aspect of the medial midfoot and forefoot measuring 11.2 x 3.8 x 6.9 cm. These results will be called to the ordering clinician or representative by the Radiologist Assistant, and communication documented in the PACS or [employer] Dashboard. Electronically Signed   By: Duanne GuessNicholas  Plundo D.O.   On: 12/02/2020 11:25   DG Foot Complete Right  Result Date: 12/11/2020 Please see detailed radiograph report in office note.  DG Foot Complete Right  Result Date: 12/09/2020 CLINICAL DATA:  Weakness.  Diabetes.  Foot infection. EXAM: RIGHT FOOT COMPLETE - 3+ VIEW COMPARISON:  [facility] 12/09/2020 at 3:30 p.m., also MRI of the foot 12/02/2020 FINDINGS: Severe bony destructive findings involving the navicular and cuneiform bones with fragmentation and indistinctness of the structures. Mild to moderate demineralization and destructive findings in the cuboid. Substantial erosions and indistinct destructive findings involving the bases of the second through fifth metatarsals with extensive sclerosis and periostitis especially in the fourth and fifth metatarsals compatible with osteomyelitis. Large erosions involving the head of the fifth metatarsal and the base of the proximal phalanx fifth toe. Speckled calcifications in the soft tissues. Severe dorsal soft tissue swelling potentially with tiny locules of gas in the soft tissues. Notable soft tissue swelling above the talar head. Plantar and Achilles calcaneal spurs. IMPRESSION: 1. Extensive bony destructive findings in the midfoot and Lisfranc joint, as well as along the fifth MTP joint, likely mostly from osteomyelitis and multiple confluent septic joints, although a component of Charcot arthropathy is not excluded. 2. Marked soft tissue swelling along the dorsum of the foot, similar findings on the MRI from 12/02/2020 were due to abscess  and subcutaneous edema. There is potentially some minimal gas loculation along the dorsal soft tissues of the foot. Electronically Signed   By: Gaylyn RongWalter  Liebkemann M.D.   On: 12/09/2020 18:12   VAS US ABI WITH/WO TBI  Result Date: 12/10/2020  LOWER EXTREMITY DOPPLER STUDY  Patient Name:  DEMIKO CADIENTE  Date of Exam:   12/10/2020 Medical Rec #: [id number]        Accession #:    [id number] Date of Birth: 1965-03-12        Patient Gender: M Patient Age:   70 years Exam Location[facility]  [facility] Procedure:      VAS [place] ABI WITH/WO TBI Referring Phys: TIMOTHY OPYD --------------------------------------------------------------------------------  Indications: Ulceration, and left BKA. High Risk Factors: Hypertension, Diabetes.  Limitations: Today's exam was limited due to edema. Comparison Study: No prior study Performing Technologist: Maudry Mayhew MHA, RVT, RDCS, RDMS  Examination Guidelines: A complete evaluation includes at minimum, Doppler waveform signals and systolic blood pressure reading at the level of bilateral brachial, anterior tibial, and posterior tibial arteries, when vessel segments are accessible. Bilateral testing is considered an integral part of a complete examination. Photoelectric Plethysmograph (PPG) waveforms and toe systolic pressure readings are included as required and additional duplex testing as needed. Limited examinations for reoccurring indications may be performed as noted.  ABI Findings: +---------+------------------+-----+---------+--------+ Right    Rt Pressure (mmHg)IndexWaveform Comment  +---------+------------------+-----+---------+--------+ Brachial 127                    triphasic         +---------+------------------+-----+---------+--------+ PTA      132               1.04 triphasic         +---------+------------------+-----+---------+--------+ DP       148               1.17 biphasic           +---------+------------------+-----+---------+--------+ Great Toe89                0.70                   +---------+------------------+-----+---------+--------+ +---------+------------------+-----+---------+-------+ Left     Lt Pressure (mmHg)IndexWaveform Comment +---------+------------------+-----+---------+-------+ Brachial 127                    triphasic        +---------+------------------+-----+---------+-------+ PTA                                      BKA     +---------+------------------+-----+---------+-------+ DP                                       BKA     +---------+------------------+-----+---------+-------+ Great Toe                                BKA     +---------+------------------+-----+---------+-------+ +-------+-----------+-----------+------------+------------+ ABI/TBIToday's ABIToday's TBIPrevious ABIPrevious TBI +-------+-----------+-----------+------------+------------+ Right  1.17       0.70                                +-------+-----------+-----------+------------+------------+ Left   BKA        BKA                                 +-------+-----------+-----------+------------+------------+  Summary: Right: Resting right ankle-brachial index is within  normal range. No evidence of significant right lower extremity arterial disease. The right toe-brachial index is normal.  *See table(s) above for measurements and observations.  Electronically signed by Deitra Mayo MD on 12/10/2020 at 12:15:12 PM.    Final     Microbiology: Recent Results (from the past 240 hour(s))  Blood Culture (routine x 2)     Status: None   Collection Time: 12/09/20  5:19 PM   Specimen: BLOOD  Result Value Ref Range Status   Specimen Description BLOOD RIGHT ANTECUBITAL  Final   Special Requests   Final    BOTTLES DRAWN AEROBIC ONLY Blood Culture adequate volume   Culture   Final    NO GROWTH 5 DAYS Performed at [facility], [address], [place], [place] [address]    Report Status 12/14/2020 FINAL  Final  Blood Culture (routine x 2)     Status: None   Collection Time: 12/09/20  5:19 PM   Specimen: BLOOD  Result Value Ref Range Status   Specimen Description BLOOD RIGHT ANTECUBITAL  Final   Special Requests   Final    BOTTLES DRAWN AEROBIC AND ANAEROBIC Blood Culture adequate volume   Culture   Final    NO GROWTH 5 DAYS Performed at [facility], [address], [place], [place] [address]    Report Status 12/14/2020 FINAL  Final  Resp Panel by RT-PCR (Flu A&B, Covid) Nasopharyngeal Swab     Status: None   Collection Time: 12/10/20  3:22 AM   Specimen: Nasopharyngeal Swab; Nasopharyngeal(NP) swabs in vial transport medium  Result Value Ref Range Status   SARS Coronavirus 2 by RT PCR NEGATIVE NEGATIVE Final    Comment: (NOTE) SARS-CoV-2 target nucleic acids are NOT DETECTED.  The SARS-CoV-2 RNA is generally detectable in upper respiratory specimens during the acute phase of infection. The lowest concentration of SARS-CoV-2 viral copies this assay can detect is 138 copies/mL. A negative result does not preclude SARS-Cov-2 infection and should not be used as the sole basis for treatment or other patient management decisions. A negative result may occur with  improper specimen collection/handling, submission of specimen other than nasopharyngeal swab, presence of viral mutation(s) within the areas targeted by this assay, and inadequate number of viral copies(<138 copies/mL). A negative result must be combined with clinical observations, patient history, and epidemiological information. The expected result is Negative.  Fact Sheet for Patients:  EntrepreneurPulse.com.au  Fact Sheet for Healthcare Providers:  IncredibleEmployment.be  This test is no t yet approved or cleared by the [place] FDA and  has been authorized for detection and/or diagnosis of SARS-CoV-2  by FDA under an Emergency Use Authorization (EUA). This EUA will remain  in effect (meaning this test can be used) for the duration of the COVID-19 declaration under Section 564(b)(1) of the Act, 21 U.S.C.section 360bbb-3(b)(1), unless the authorization is terminated  or revoked sooner.       Influenza A by PCR NEGATIVE NEGATIVE Final   Influenza B by PCR NEGATIVE NEGATIVE Final    Comment: (NOTE) The Xpert Xpress SARS-CoV-2/FLU/RSV plus assay is intended as an aid in the diagnosis of influenza from Nasopharyngeal swab specimens and should not be used as a sole basis for treatment. Nasal washings and aspirates are unacceptable for Xpert Xpress SARS-CoV-2/FLU/RSV testing.  Fact Sheet for Patients: EntrepreneurPulse.com.au  Fact Sheet for Healthcare Providers: IncredibleEmployment.be  This test is not yet approved or cleared by the [place] and has been authorized  for detection and/or diagnosis of SARS-CoV-2 by FDA under an Emergency Use Authorization (EUA). This EUA will remain in effect (meaning this test can be used) for the duration of the COVID-19 declaration under Section 564(b)(1) of the Act, 21 U.S.C. section 360bbb-3(b)(1), unless the authorization is terminated or revoked.  Performed at [facility], [address], [place], [place] [address]   Surgical pcr screen     Status: None   Collection Time: 12/12/20  8:12 PM   Specimen: Nasal Mucosa; Nasal Swab  Result Value Ref Range Status   MRSA, PCR NEGATIVE NEGATIVE Final   Staphylococcus aureus NEGATIVE NEGATIVE Final    Comment: (NOTE) The Xpert SA Assay (FDA approved for NASAL specimens in patients 49 years of age and older), is one component of a comprehensive surveillance program. It is not intended to diagnose infection nor to guide or monitor treatment. Performed at [facility], [address] [place], [place] [address]      Labs: Basic Metabolic  Panel: Recent Labs  Lab 12/10/20 0047 12/10/20 0301 12/11/20 0158 12/12/20 0128 12/13/20 0304 12/14/20 0122 12/15/20 0035  NA  --    < > 134* 133* 134* 133* 130*  K  --    < > 4.1 3.7 3.5 3.6 3.5  CL  --    < > 101 100 102 102 100  CO2  --    < > 25 23 24 23  21*  GLUCOSE  --    < > 153* 131* 117* 182* 129*  BUN  --    < > 14 11 9 11 12   CREATININE  --    < > 1.05 0.99 0.89 0.95 0.95  CALCIUM  --    < > 9.0 8.6* 8.1* 7.9* 7.8*  MG 1.6*  --   --   --   --   --   --    < > = values in this interval not displayed.   Liver Function Tests: Recent Labs  Lab 12/09/20 1711  AST 15  ALT 13  ALKPHOS 83  BILITOT 0.7  PROT 8.0  ALBUMIN 2.6*   No results for input(s): LIPASE, AMYLASE in the last 168 hours. No results for input(s): AMMONIA in the last 168 hours. CBC: Recent Labs  Lab 12/09/20 1711 12/10/20 0301 12/12/20 0128 12/13/20 0304 12/13/20 2057 12/14/20 0122 12/14/20 1627 12/15/20 0035  WBC 9.9   < > 5.8 5.2  --  8.1 8.5 7.7  NEUTROABS 7.6  --   --   --   --   --   --   --   HGB 8.9*   < > 7.3* 7.4* 8.5* 7.6* 8.9* 8.9*  HCT 29.0*   < > 23.9* 24.0* 26.3* 24.1* 27.6* 28.3*  MCV 81.5   < > 80.5 80.5  --  82.0 83.1 84.2  PLT 490*   < > 325 323  --  318 329 351   < > = values in this interval not displayed.   Cardiac Enzymes: No results for input(s): CKTOTAL, CKMB, CKMBINDEX, TROPONINI in the last 168 hours. BNP: BNP (last 3 results) No results for input(s): BNP in the last 8760 hours.  ProBNP (last 3 results) No results for input(s): PROBNP in the last 8760 hours.  CBG: Recent Labs  Lab 12/14/20 1941 12/15/20 0054 12/15/20 0407 12/15/20 0812 12/15/20 1145  GLUCAP 131* 130* 120* 150* 123*       Signed:  12/17/20 MD.  [facility] 12/15/2020, 11:54 AM

## 2020-12-15 NOTE — Progress Notes (Signed)
Mobility Specialist Progress Note   12/15/20 1045  Mobility  Activity Refused mobility   Pt in a lot of pain right now, contacted RN and will try again in the afternoon if pt is still here.  Frederico Hamman Mobility Specialist Phone Number [phone]

## 2020-12-15 NOTE — TOC Transition Note (Signed)
Transition of Care [facility]) - CM/SW Discharge Note   Patient Details  Name: Kenneth Cohen MRN: [id number] Date of Birth: 1965/09/12  Transition of Care [facility]) CM/SW Contact:  Bess Kinds, RN Phone Number: [phone] 12/15/2020, 11:49 AM   Clinical Narrative:     Spoke with patient on his cell phone to discuss plans to transition home. He is agreeable to home health therapies. Offered choice of home health agencies. Referral accepted by [facility]. Discussed DME needs - requested wheelchair and slide board. Referral accepted by [facility] for delivery to the room. No further TOC needs identified.   Final next level of care: Home w Home Health Services Barriers to Discharge: No Barriers Identified   Patient Goals and CMS Choice Patient states their goals for this hospitalization and ongoing recovery are:: return home CMS Medicare.gov Compare Post Acute Care list provided to:: Patient Choice offered to / list presented to : Patient  Discharge Placement                       Discharge Plan and Services                DME Arranged: Wheelchair manual DME Agency: [facility] Date DME Agency Contacted: 12/15/20 Time DME Agency Contacted: 1148 Representative spoke with at DME Agency: Leavy Cella HH Arranged: PT, OT, RN HH Agency: [facility] Date [facility] Agency Contacted: 12/15/20   Representative spoke with at [facility] Agency: Kandee Keen  Social Determinants of Health (SDOH) Interventions     Readmission Risk Interventions No flowsheet data found.

## 2020-12-16 LAB — SURGICAL PATHOLOGY

## 2020-12-17 ENCOUNTER — Ambulatory Visit: Payer: Medicare Other | Admitting: Physician Assistant

## 2020-12-17 ENCOUNTER — Telehealth: Payer: Self-pay

## 2020-12-17 DIAGNOSIS — E43 Unspecified severe protein-calorie malnutrition: Secondary | ICD-10-CM | POA: Diagnosis not present

## 2020-12-17 DIAGNOSIS — Z89511 Acquired absence of right leg below knee: Secondary | ICD-10-CM | POA: Diagnosis not present

## 2020-12-17 DIAGNOSIS — Z7984 Long term (current) use of oral hypoglycemic drugs: Secondary | ICD-10-CM | POA: Diagnosis not present

## 2020-12-17 DIAGNOSIS — N179 Acute kidney failure, unspecified: Secondary | ICD-10-CM | POA: Diagnosis not present

## 2020-12-17 DIAGNOSIS — E11610 Type 2 diabetes mellitus with diabetic neuropathic arthropathy: Secondary | ICD-10-CM | POA: Diagnosis not present

## 2020-12-17 DIAGNOSIS — Z8631 Personal history of diabetic foot ulcer: Secondary | ICD-10-CM | POA: Diagnosis not present

## 2020-12-17 DIAGNOSIS — E785 Hyperlipidemia, unspecified: Secondary | ICD-10-CM | POA: Diagnosis not present

## 2020-12-17 DIAGNOSIS — R9431 Abnormal electrocardiogram [ECG] [EKG]: Secondary | ICD-10-CM | POA: Diagnosis not present

## 2020-12-17 DIAGNOSIS — Z794 Long term (current) use of insulin: Secondary | ICD-10-CM | POA: Diagnosis not present

## 2020-12-17 DIAGNOSIS — D509 Iron deficiency anemia, unspecified: Secondary | ICD-10-CM | POA: Diagnosis not present

## 2020-12-17 DIAGNOSIS — Z9181 History of falling: Secondary | ICD-10-CM | POA: Diagnosis not present

## 2020-12-17 DIAGNOSIS — Z6839 Body mass index (BMI) 39.0-39.9, adult: Secondary | ICD-10-CM | POA: Diagnosis not present

## 2020-12-17 DIAGNOSIS — E669 Obesity, unspecified: Secondary | ICD-10-CM | POA: Diagnosis not present

## 2020-12-17 DIAGNOSIS — Z4781 Encounter for orthopedic aftercare following surgical amputation: Secondary | ICD-10-CM | POA: Diagnosis not present

## 2020-12-17 DIAGNOSIS — E871 Hypo-osmolality and hyponatremia: Secondary | ICD-10-CM | POA: Diagnosis not present

## 2020-12-17 DIAGNOSIS — D631 Anemia in chronic kidney disease: Secondary | ICD-10-CM | POA: Diagnosis not present

## 2020-12-17 DIAGNOSIS — I129 Hypertensive chronic kidney disease with stage 1 through stage 4 chronic kidney disease, or unspecified chronic kidney disease: Secondary | ICD-10-CM | POA: Diagnosis not present

## 2020-12-17 DIAGNOSIS — Z89512 Acquired absence of left leg below knee: Secondary | ICD-10-CM | POA: Diagnosis not present

## 2020-12-17 DIAGNOSIS — N182 Chronic kidney disease, stage 2 (mild): Secondary | ICD-10-CM | POA: Diagnosis not present

## 2020-12-17 DIAGNOSIS — E1122 Type 2 diabetes mellitus with diabetic chronic kidney disease: Secondary | ICD-10-CM | POA: Diagnosis not present

## 2020-12-17 NOTE — Telephone Encounter (Signed)
Transition Care Management Follow-up Telephone Call Date of discharge and from where: [facility] on 12/15/20 How have you been since you were released from the hospital? Patient states doing well Any questions or concerns? No  Items Reviewed: Did the pt receive and understand the discharge instructions provided? Yes  Medications obtained and verified? Yes  Other? No  Any new allergies since your discharge? No  Dietary orders reviewed? Yes Do you have support at home? Yes   Home Care and Equipment/Supplies: Were home health services ordered? yes If so, what is the name of the agency? [facility]  Has the agency set up a time to come to the patient's home? yes Were any new equipment or medical supplies ordered?  Yes: , slide board and wheel chair What is the name of the medical supply agency? Information not available Were you able to get the supplies/equipment? yes Do you have any questions related to the use of the equipment or supplies? No  Functional Questionnaire: (I = Independent and D = Dependent) ADLs: I  Bathing/Dressing- I  Meal Prep- I, spouse helps with meals  Eating- I   Maintaining continence- I  Transferring/Ambulation- I with assistance of wheelchair and slide board  Managing Meds- I  Follow up appointments reviewed:  PCP Hospital f/u appt confirmed? No  Patient plans on scheduling F/U visit. Specialist Hospital f/u appt confirmed? Yes  Scheduled to see Dr. Lajoyce Corners on 12/23/20 @ 2:30pm. Are transportation arrangements needed? No  If their condition worsens, is the pt aware to call PCP or go to the Emergency Dept.? Yes Was the patient provided with contact information for the PCP's office or ED? Yes Was to pt encouraged to call back with questions or concerns? Yes

## 2020-12-18 ENCOUNTER — Ambulatory Visit: Payer: Medicare Other | Admitting: Internal Medicine

## 2020-12-18 LAB — VITAMIN C: Vitamin C: <0.1 mg/dL — ABNORMAL LOW (ref 0.4–2.0)

## 2020-12-20 ENCOUNTER — Telehealth: Payer: Self-pay

## 2020-12-20 NOTE — Telephone Encounter (Signed)
Tora Kindred with [facility] called. He needs verbal orders from Dr.Duda. Requesting PT 1wk/8 Skilled nursing 1wk/1 OT 1wk/1 Call back #[phone]  Thanks

## 2020-12-23 ENCOUNTER — Ambulatory Visit (INDEPENDENT_AMBULATORY_CARE_PROVIDER_SITE_OTHER): Payer: Medicare Other | Admitting: Orthopedic Surgery

## 2020-12-23 ENCOUNTER — Telehealth: Payer: Self-pay | Admitting: Orthopedic Surgery

## 2020-12-23 DIAGNOSIS — Z89511 Acquired absence of right leg below knee: Secondary | ICD-10-CM

## 2020-12-23 NOTE — Telephone Encounter (Signed)
Pt returned call to [place]. Please call pt at [phone].

## 2020-12-23 NOTE — Telephone Encounter (Signed)
Not sure if this is meant to say his physical therapist as I did call them to f/u on orders they requested. I did not call patient this morning.

## 2020-12-23 NOTE — Telephone Encounter (Signed)
Spoke with physical therapist. He was given VO approval

## 2020-12-23 NOTE — Telephone Encounter (Signed)
I spoke with Tim, PT with [facility], it was him that was returning my call on an order on previous message.

## 2020-12-23 NOTE — Telephone Encounter (Signed)
LMTCB, no details left due to no Id'd VM

## 2020-12-24 ENCOUNTER — Telehealth: Payer: Self-pay | Admitting: Orthopedic Surgery

## 2020-12-24 ENCOUNTER — Encounter: Payer: Self-pay | Admitting: Orthopedic Surgery

## 2020-12-24 NOTE — Telephone Encounter (Signed)
HH calling for pt. Pt stated he is wanting to declining P.T and states he is managing at home. The best call back number for [facility] if needed is [phone].

## 2020-12-24 NOTE — Progress Notes (Signed)
Office Visit Note   Patient: Kenneth Cohen           Date of Birth: Oct 12, 1965           MRN: [id number] Visit Date: 12/23/2020              Requested by: Doreene Nest, NP [address] [place],  [place] [address] PCP: Doreene Nest, NP  Chief Complaint  Patient presents with   Right Leg - Routine Post Op    Right BKA 12/13/20 Wound vac removed today with drainage in canister Wearing shrinker      HPI: Patient is a 55 year old gentleman who was seen status post right transtibial amputation he is about 2 weeks out.  Assessment & Plan: Visit Diagnoses:  1. Right below-knee amputee [facility])     Plan: Start Dial soap cleansing with dry compression wrap.  Evaluate in 1 week to remove the staples  Patient is a bilateral amputee will need paperwork completed for his student loan.  Follow-Up Instructions: Return in about 1 week (around 12/30/2020).   Ortho Exam  Patient is alert, oriented, no adenopathy, well-dressed, normal affect, normal respiratory effort. Examination the incision is well approximated there is swelling and blistering and dermatitis.  There is about 100 cc in the wound VAC canister.  Imaging: No results found.    Labs: Lab Results  Component Value Date   HGBA1C 6.8 (A) 11/12/2020   HGBA1C 9.2 (H) 04/30/2020   HGBA1C 8.4 (A) 03/29/2019   ESRSEDRATE 112 (H) 12/11/2020   ESRSEDRATE 113 (H) 12/10/2020   ESRSEDRATE 81 (H) 12/10/2020   CRP 11.3 (H) 12/12/2020   CRP 14.8 (H) 12/11/2020   CRP 13.0 (H) 12/10/2020   LABURIC 8.1 (H) 08/13/2015   REPTSTATUS 12/14/2020 FINAL 12/09/2020   REPTSTATUS 12/14/2020 FINAL 12/09/2020   CULT  12/09/2020    NO GROWTH 5 DAYS Performed at [facility], [address], [place], [place] [address]    CULT  12/09/2020    NO GROWTH 5 DAYS Performed at [facility], [address], [place], [place] [address]      Lab Results  Component Value Date   ALBUMIN 2.6 (L) 12/09/2020   ALBUMIN 3.8  04/30/2020   ALBUMIN 4.0 03/29/2019   PREALBUMIN 8.3 (L) 12/10/2020    Lab Results  Component Value Date   MG 1.6 (L) 12/10/2020   No results found for: [facility]  Lab Results  Component Value Date   PREALBUMIN 8.3 (L) 12/10/2020   CBC EXTENDED Latest Ref Rng & Units 12/15/2020 12/14/2020 12/14/2020  WBC 4.0 - 10.5 K/uL 7.7 8.5 8.1  RBC 4.22 - 5.81 MIL/uL 3.36(L) 3.32(L) 2.94(L)  HGB 13.0 - 17.0 g/dL 8.6(V) 8.9(L) 7.6(L)  HCT 39.0 - 52.0 % 28.3(L) 27.6(L) 24.1(L)  PLT 150 - 400 K/uL 351 329 318  NEUTROABS 1.7 - 7.7 K/uL - - -  LYMPHSABS 0.7 - 4.0 K/uL - - -     There is no height or weight on file to calculate BMI.  Orders:  No orders of the defined types were placed in this encounter.  No orders of the defined types were placed in this encounter.    Procedures: No procedures performed  Clinical Data: No additional findings.  ROS:  All other systems negative, except as noted in the HPI. Review of Systems  Objective: Vital Signs: There were no vitals taken for this visit.  Specialty Comments:  No specialty comments available.  PMFS History: Patient Active Problem List   Diagnosis Date Noted   Charcot's joint arthropathy in type 2 diabetes mellitus ([place])    Severe protein-calorie malnutrition ([place])    Diabetic infection of right foot ([place]) 12/09/2020   Subacute osteomyelitis, right ankle and foot ([place]) 12/09/2020   Hyponatremia 12/09/2020   CKD (chronic kidney disease), stage II 12/09/2020   Prolonged QT interval 12/09/2020   Normocytic anemia    Hx of BKA, left ([place]) 11/12/2020   Diabetic ulcer of foot with fat layer exposed ([place]) 11/12/2020   Erectile dysfunction 03/29/2019   Adjustment disorder with anxiety 08/19/2016   Charcot's joint, left ankle and foot 09/07/2015   Chronic venous insufficiency 07/16/2015   Preventative health care 10/02/2014   Hyperlipidemia 10/02/2014   Neuropathy associated with endocrine disorder ([place]) 10/02/2014   Essential  hypertension 06/12/2014   Obesity (BMI 30-39.9) 06/12/2014   Diabetes mellitus type 2 with complications ([place]) 0000000   Past Medical History:  Diagnosis Date   Diabetic Charcot foot ([place])    Hypertension    Obesity    Testicular torsion    as a child    Family History  Problem Relation Age of Onset   Hypertension Mother    Diabetes Mother    Diabetes Father     Past Surgical History:  Procedure Laterality Date   AMPUTATION Right 12/13/2020   Procedure: AMPUTATION BELOW KNEE;  Surgeon: Newt Minion, MD;  Location: [place];  Service: Orthopedics;  Laterality: Right;   APPLICATION OF WOUND VAC Right 12/13/2020   Procedure: APPLICATION OF WOUND VAC;  Surgeon: Newt Minion, MD;  Location: [facility];  Service: Orthopedics;  Laterality: Right;   Social History   Occupational History   Not on file  Tobacco Use   Smoking status: Former    Types: Cigarettes    Quit date: 01/20/2004    Years since quitting: 16.9   Smokeless tobacco: Never  Substance and Sexual Activity   Alcohol use: No    Alcohol/week: 0.0 standard drinks    Comment: rarely   Drug use: No   Sexual activity: Not on file

## 2020-12-25 DIAGNOSIS — I129 Hypertensive chronic kidney disease with stage 1 through stage 4 chronic kidney disease, or unspecified chronic kidney disease: Secondary | ICD-10-CM | POA: Diagnosis not present

## 2020-12-25 DIAGNOSIS — N182 Chronic kidney disease, stage 2 (mild): Secondary | ICD-10-CM | POA: Diagnosis not present

## 2020-12-25 DIAGNOSIS — N179 Acute kidney failure, unspecified: Secondary | ICD-10-CM | POA: Diagnosis not present

## 2020-12-25 DIAGNOSIS — Z4781 Encounter for orthopedic aftercare following surgical amputation: Secondary | ICD-10-CM | POA: Diagnosis not present

## 2020-12-25 DIAGNOSIS — E11610 Type 2 diabetes mellitus with diabetic neuropathic arthropathy: Secondary | ICD-10-CM | POA: Diagnosis not present

## 2020-12-25 DIAGNOSIS — E1122 Type 2 diabetes mellitus with diabetic chronic kidney disease: Secondary | ICD-10-CM | POA: Diagnosis not present

## 2020-12-25 NOTE — Telephone Encounter (Signed)
Noted  

## 2020-12-27 ENCOUNTER — Telehealth: Payer: Self-pay | Admitting: Orthopedic Surgery

## 2020-12-27 NOTE — Telephone Encounter (Signed)
Received call from Tora Kindred (PT) with [facility] advised patient is requesting to be discharged from (PT)    The number to contact Selena Batten is [phone]

## 2020-12-27 NOTE — Telephone Encounter (Signed)
Noted  

## 2020-12-30 ENCOUNTER — Ambulatory Visit (INDEPENDENT_AMBULATORY_CARE_PROVIDER_SITE_OTHER): Payer: Medicare Other | Admitting: Orthopedic Surgery

## 2020-12-30 ENCOUNTER — Other Ambulatory Visit: Payer: Self-pay

## 2020-12-30 ENCOUNTER — Encounter: Payer: Self-pay | Admitting: Orthopedic Surgery

## 2020-12-30 DIAGNOSIS — Z89511 Acquired absence of right leg below knee: Secondary | ICD-10-CM

## 2020-12-30 NOTE — Progress Notes (Signed)
Patient is a 55 year old gentleman who is seen in follow-up for right transtibial amputation he is almost 3 weeks out from surgery.  Patient states he is exercising his knee at home and is wearing the 4 extra-large stump shrinker.  Staples harvested today.  Patient was given a prescription for [facility] in [facility] for a smaller stump shrinker and patient will need a K3 level prosthesis from [facility].  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  Follow-up in 4 weeks.  Examination patient still has some swelling with thin blistering proximally where the dressing was in place this superficial blistering is most likely secondary to the dressing.  The surgical incision is well approximated there is no cellulitis no drainage no signs of infection.

## 2021-01-27 ENCOUNTER — Ambulatory Visit (INDEPENDENT_AMBULATORY_CARE_PROVIDER_SITE_OTHER): Payer: Medicare Other | Admitting: Orthopedic Surgery

## 2021-01-27 ENCOUNTER — Other Ambulatory Visit: Payer: Self-pay

## 2021-01-27 DIAGNOSIS — Z89512 Acquired absence of left leg below knee: Secondary | ICD-10-CM

## 2021-01-27 DIAGNOSIS — Z89511 Acquired absence of right leg below knee: Secondary | ICD-10-CM

## 2021-01-28 ENCOUNTER — Encounter: Payer: Self-pay | Admitting: Orthopedic Surgery

## 2021-01-28 NOTE — Progress Notes (Signed)
Office Visit Note   Patient: Kenneth Cohen           Date of Birth: October 31, 1965           MRN: [id number] Visit Date: 01/27/2021              Requested by: Pleas Koch, NP [place],  [place] [address] PCP: Pleas Koch, NP  Chief Complaint  Patient presents with   Right Leg - Routine Post Op    12/13/2020 right BKA       HPI: Patient is a 56 year old gentleman who is 6 weeks status post right transtibial amputation.  He also has an existing left transtibial amputation.  Patient feels like there is a retained suture.  Patient has had slight wound dehiscence.  Assessment & Plan: Visit Diagnoses:  1. Right below-knee amputee ([place])   2. Left below-knee amputee ([place])     Plan: Retained sutures were removed sterile dressing applied patient will make an appointment with [facility] for casting for the residual limb.  Follow-Up Instructions: Return in about 4 weeks (around 02/24/2021).   Ortho Exam  Patient is alert, oriented, no adenopathy, well-dressed, normal affect, normal respiratory effort. Examination there are a few Vicryl sutures within the wound that are removed without complication.  Fibrinous tissue was removed from the wounds and there was good bleeding granulation tissue.  4 x 4's and an Ace wrap were applied.  Imaging: No results found. No images are attached to the encounter.  Labs: Lab Results  Component Value Date   HGBA1C 6.8 (A) 11/12/2020   HGBA1C 9.2 (H) 04/30/2020   HGBA1C 8.4 (A) 03/29/2019   ESRSEDRATE 112 (H) 12/11/2020   ESRSEDRATE 113 (H) 12/10/2020   ESRSEDRATE 81 (H) 12/10/2020   CRP 11.3 (H) 12/12/2020   CRP 14.8 (H) 12/11/2020   CRP 13.0 (H) 12/10/2020   LABURIC 8.1 (H) 08/13/2015   REPTSTATUS 12/14/2020 FINAL 12/09/2020   REPTSTATUS 12/14/2020 FINAL 12/09/2020   CULT  12/09/2020    NO GROWTH 5 DAYS Performed at [facility] [address] [place], [place] [address]    CULT  12/09/2020    NO GROWTH 5  DAYS Performed at [facility] [address], [place], [place] [address]      Lab Results  Component Value Date   ALBUMIN 2.6 (L) 12/09/2020   ALBUMIN 3.8 04/30/2020   ALBUMIN 4.0 03/29/2019   PREALBUMIN 8.3 (L) 12/10/2020    Lab Results  Component Value Date   MG 1.6 (L) 12/10/2020   No results found for: [facility]  Lab Results  Component Value Date   PREALBUMIN 8.3 (L) 12/10/2020   CBC EXTENDED Latest Ref Rng & Units 12/15/2020 12/14/2020 12/14/2020  WBC 4.0 - 10.5 K/uL 7.7 8.5 8.1  RBC 4.22 - 5.81 MIL/uL 3.36(L) 3.32(L) 2.94(L)  HGB 13.0 - 17.0 g/dL 8.9(L) 8.9(L) 7.6(L)  HCT 39.0 - 52.0 % 28.3(L) 27.6(L) 24.1(L)  PLT 150 - 400 K/uL 351 329 318  NEUTROABS 1.7 - 7.7 K/uL - - -  LYMPHSABS 0.7 - 4.0 K/uL - - -     There is no height or weight on file to calculate BMI.  Orders:  No orders of the defined types were placed in this encounter.  No orders of the defined types were placed in this encounter.    Procedures: No procedures performed  Clinical Data: No additional findings.  ROS:  All other systems negative, except as  noted in the HPI. Review of Systems  Objective: Vital Signs: There were no vitals taken for this visit.  Specialty Comments:  No specialty comments available.  PMFS History: Patient Active Problem List   Diagnosis Date Noted   Charcot's joint arthropathy in type 2 diabetes mellitus ([place])    Severe protein-calorie malnutrition ([place])    Diabetic infection of right foot ([place]) 12/09/2020   Subacute osteomyelitis, right ankle and foot ([place]) 12/09/2020   Hyponatremia 12/09/2020   CKD (chronic kidney disease), stage II 12/09/2020   Prolonged QT interval 12/09/2020   Normocytic anemia    Hx of BKA, left ([place]) 11/12/2020   Diabetic ulcer of foot with fat layer exposed ([place]) 11/12/2020   Erectile dysfunction 03/29/2019   Adjustment disorder with anxiety 08/19/2016   Charcot's joint, left ankle and foot 09/07/2015   Chronic venous  insufficiency 07/16/2015   Preventative health care 10/02/2014   Hyperlipidemia 10/02/2014   Neuropathy associated with endocrine disorder ([place]) 10/02/2014   Essential hypertension 06/12/2014   Obesity (BMI 30-39.9) 06/12/2014   Diabetes mellitus type 2 with complications ([place]) 0000000   Past Medical History:  Diagnosis Date   Diabetic Charcot foot ([place])    Hypertension    Obesity    Testicular torsion    as a child    Family History  Problem Relation Age of Onset   Hypertension Mother    Diabetes Mother    Diabetes Father     Past Surgical History:  Procedure Laterality Date   AMPUTATION Right 12/13/2020   Procedure: AMPUTATION BELOW KNEE;  Surgeon: Newt Minion, MD;  Location: [facility];  Service: Orthopedics;  Laterality: Right;   APPLICATION OF WOUND VAC Right 12/13/2020   Procedure: APPLICATION OF WOUND VAC;  Surgeon: Newt Minion, MD;  Location: [facility];  Service: Orthopedics;  Laterality: Right;   Social History   Occupational History   Not on file  Tobacco Use   Smoking status: Former    Types: Cigarettes    Quit date: 01/20/2004    Years since quitting: 17.0   Smokeless tobacco: Never  Substance and Sexual Activity   Alcohol use: No    Alcohol/week: 0.0 standard drinks    Comment: rarely   Drug use: No   Sexual activity: Not on file

## 2021-01-29 ENCOUNTER — Other Ambulatory Visit: Payer: Self-pay | Admitting: Primary Care

## 2021-01-29 DIAGNOSIS — I10 Essential (primary) hypertension: Secondary | ICD-10-CM

## 2021-02-07 ENCOUNTER — Ambulatory Visit: Payer: Medicare Other | Admitting: Primary Care

## 2021-02-19 ENCOUNTER — Telehealth: Payer: Self-pay | Admitting: Orthopedic Surgery

## 2021-02-19 NOTE — Telephone Encounter (Signed)
FMLA forms for pts wife faxed to [facility]. Copy at front dest to be picked up.

## 2021-02-24 ENCOUNTER — Ambulatory Visit: Payer: Medicare Other | Admitting: Orthopedic Surgery

## 2021-02-25 ENCOUNTER — Other Ambulatory Visit: Payer: Self-pay | Admitting: Primary Care

## 2021-02-25 DIAGNOSIS — I10 Essential (primary) hypertension: Secondary | ICD-10-CM

## 2021-03-17 ENCOUNTER — Ambulatory Visit (INDEPENDENT_AMBULATORY_CARE_PROVIDER_SITE_OTHER): Payer: Medicare Other | Admitting: Orthopedic Surgery

## 2021-03-17 ENCOUNTER — Encounter: Payer: Self-pay | Admitting: Orthopedic Surgery

## 2021-03-17 DIAGNOSIS — Z89511 Acquired absence of right leg below knee: Secondary | ICD-10-CM

## 2021-03-17 DIAGNOSIS — Z89512 Acquired absence of left leg below knee: Secondary | ICD-10-CM

## 2021-03-18 ENCOUNTER — Encounter: Payer: Self-pay | Admitting: Orthopedic Surgery

## 2021-03-18 NOTE — Progress Notes (Signed)
Office Visit Note   Patient: Kenneth Cohen           Date of Birth: 11/30/1965           MRN: [id number] Visit Date: 03/17/2021              Requested by: Pleas Koch, NP [place],  [place] [address] PCP: Pleas Koch, NP  Chief Complaint  Patient presents with   Right Knee - Pain    12/13/20 RIGHT BKA      HPI: Patient is a 56 year old gentleman who is 3 months status post right below-knee amputation.  He states he is doing well still has some swelling.  Assessment & Plan: Visit Diagnoses:  1. Right below-knee amputee ([place])   2. Left below-knee amputee [facility])     Plan: Patient will follow-up with [facility] for a smaller shrinker and prosthetic fitting.  Follow-Up Instructions: Return in about 4 weeks (around 04/14/2021).   Ortho Exam  Patient is alert, oriented, no adenopathy, well-dressed, normal affect, normal respiratory effort. Examination patient is a 4 extra-large stump shrinker he does have swelling no ulcers no drainage.  Imaging: No results found. No images are attached to the encounter.  Labs: Lab Results  Component Value Date   HGBA1C 6.8 (A) 11/12/2020   HGBA1C 9.2 (H) 04/30/2020   HGBA1C 8.4 (A) 03/29/2019   ESRSEDRATE 112 (H) 12/11/2020   ESRSEDRATE 113 (H) 12/10/2020   ESRSEDRATE 81 (H) 12/10/2020   CRP 11.3 (H) 12/12/2020   CRP 14.8 (H) 12/11/2020   CRP 13.0 (H) 12/10/2020   LABURIC 8.1 (H) 08/13/2015   REPTSTATUS 12/14/2020 FINAL 12/09/2020   REPTSTATUS 12/14/2020 FINAL 12/09/2020   CULT  12/09/2020    NO GROWTH 5 DAYS Performed at [facility] [address] [place], [place] [address]    CULT  12/09/2020    NO GROWTH 5 DAYS Performed at [facility] [address], [place], [place] [address]      Lab Results  Component Value Date   ALBUMIN 2.6 (L) 12/09/2020   ALBUMIN 3.8 04/30/2020   ALBUMIN 4.0 03/29/2019   PREALBUMIN 8.3 (L) 12/10/2020    Lab Results  Component Value Date   MG 1.6  (L) 12/10/2020   No results found for: [facility]  Lab Results  Component Value Date   PREALBUMIN 8.3 (L) 12/10/2020   CBC EXTENDED Latest Ref Rng & Units 12/15/2020 12/14/2020 12/14/2020  WBC 4.0 - 10.5 K/uL 7.7 8.5 8.1  RBC 4.22 - 5.81 MIL/uL 3.36(L) 3.32(L) 2.94(L)  HGB 13.0 - 17.0 g/dL 8.9(L) 8.9(L) 7.6(L)  HCT 39.0 - 52.0 % 28.3(L) 27.6(L) 24.1(L)  PLT 150 - 400 K/uL 351 329 318  NEUTROABS 1.7 - 7.7 K/uL - - -  LYMPHSABS 0.7 - 4.0 K/uL - - -     There is no height or weight on file to calculate BMI.  Orders:  No orders of the defined types were placed in this encounter.  No orders of the defined types were placed in this encounter.    Procedures: No procedures performed  Clinical Data: No additional findings.  ROS:  All other systems negative, except as noted in the HPI. Review of Systems  Objective: Vital Signs: There were no vitals taken for this visit.  Specialty Comments:  No specialty comments available.  PMFS History: Patient Active Problem List   Diagnosis Date Noted   Charcot's joint arthropathy in type 2 diabetes mellitus (  [place])    Severe protein-calorie malnutrition ([place])    Diabetic infection of right foot ([place]) 12/09/2020   Subacute osteomyelitis, right ankle and foot ([place]) 12/09/2020   Hyponatremia 12/09/2020   CKD (chronic kidney disease), stage II 12/09/2020   Prolonged QT interval 12/09/2020   Normocytic anemia    Hx of BKA, left ([place]) 11/12/2020   Diabetic ulcer of foot with fat layer exposed ([place]) 11/12/2020   Erectile dysfunction 03/29/2019   Adjustment disorder with anxiety 08/19/2016   Charcot's joint, left ankle and foot 09/07/2015   Chronic venous insufficiency 07/16/2015   Preventative health care 10/02/2014   Hyperlipidemia 10/02/2014   Neuropathy associated with endocrine disorder ([place]) 10/02/2014   Essential hypertension 06/12/2014   Obesity (BMI 30-39.9) 06/12/2014   Diabetes mellitus type 2 with complications ([place])  0000000   Past Medical History:  Diagnosis Date   Diabetic Charcot foot ([place])    Hypertension    Obesity    Testicular torsion    as a child    Family History  Problem Relation Age of Onset   Hypertension Mother    Diabetes Mother    Diabetes Father     Past Surgical History:  Procedure Laterality Date   AMPUTATION Right 12/13/2020   Procedure: AMPUTATION BELOW KNEE;  Surgeon: Newt Minion, MD;  Location: [place];  Service: Orthopedics;  Laterality: Right;   APPLICATION OF WOUND VAC Right 12/13/2020   Procedure: APPLICATION OF WOUND VAC;  Surgeon: Newt Minion, MD;  Location: [facility];  Service: Orthopedics;  Laterality: Right;   Social History   Occupational History   Not on file  Tobacco Use   Smoking status: Former    Types: Cigarettes    Quit date: 01/20/2004    Years since quitting: 17.1   Smokeless tobacco: Never  Substance and Sexual Activity   Alcohol use: No    Alcohol/week: 0.0 standard drinks    Comment: rarely   Drug use: No   Sexual activity: Not on file

## 2021-04-04 ENCOUNTER — Other Ambulatory Visit: Payer: Self-pay | Admitting: Primary Care

## 2021-04-04 DIAGNOSIS — I10 Essential (primary) hypertension: Secondary | ICD-10-CM

## 2021-04-14 ENCOUNTER — Ambulatory Visit: Payer: Medicare Other | Admitting: Orthopedic Surgery

## 2021-04-22 ENCOUNTER — Encounter: Payer: Self-pay | Admitting: Primary Care

## 2021-04-22 ENCOUNTER — Ambulatory Visit (INDEPENDENT_AMBULATORY_CARE_PROVIDER_SITE_OTHER): Payer: Medicare Other | Admitting: Primary Care

## 2021-04-22 VITALS — BP 130/70 | HR 95

## 2021-04-22 DIAGNOSIS — E785 Hyperlipidemia, unspecified: Secondary | ICD-10-CM | POA: Diagnosis not present

## 2021-04-22 DIAGNOSIS — F4322 Adjustment disorder with anxiety: Secondary | ICD-10-CM | POA: Diagnosis not present

## 2021-04-22 DIAGNOSIS — E118 Type 2 diabetes mellitus with unspecified complications: Secondary | ICD-10-CM

## 2021-04-22 DIAGNOSIS — Z89512 Acquired absence of left leg below knee: Secondary | ICD-10-CM

## 2021-04-22 DIAGNOSIS — I10 Essential (primary) hypertension: Secondary | ICD-10-CM

## 2021-04-22 DIAGNOSIS — Z89511 Acquired absence of right leg below knee: Secondary | ICD-10-CM

## 2021-04-22 LAB — POCT GLYCOSYLATED HEMOGLOBIN (HGB A1C): Hemoglobin A1C: 7.4 % — AB (ref 4.0–5.6)

## 2021-04-22 LAB — COMPREHENSIVE METABOLIC PANEL
ALT: 26 U/L (ref 0–53)
AST: 20 U/L (ref 0–37)
Albumin: 4 g/dL (ref 3.5–5.2)
Alkaline Phosphatase: 60 U/L (ref 39–117)
BUN: 30 mg/dL — ABNORMAL HIGH (ref 6–23)
CO2: 27 mEq/L (ref 19–32)
Calcium: 9.8 mg/dL (ref 8.4–10.5)
Chloride: 99 mEq/L (ref 96–112)
Creatinine, Ser: 1.18 mg/dL (ref 0.40–1.50)
GFR: 69.65 mL/min (ref >60.00)
Glucose, Bld: 127 mg/dL — ABNORMAL HIGH (ref 70–99)
Potassium: 4.1 mEq/L (ref 3.5–5.1)
Sodium: 136 mEq/L (ref 135–145)
Total Bilirubin: 0.5 mg/dL (ref 0.2–1.2)
Total Protein: 7.9 g/dL (ref 6.0–8.3)

## 2021-04-22 LAB — LIPID PANEL
Cholesterol: 119 mg/dL (ref 0–200)
HDL: 31.4 mg/dL — ABNORMAL LOW (ref >39.00)
LDL Cholesterol: 67 mg/dL (ref 0–99)
NonHDL: 87.11
Total CHOL/HDL Ratio: 4
Triglycerides: 100 mg/dL (ref 0.0–149.0)
VLDL: 20 mg/dL (ref 0.0–40.0)

## 2021-04-22 MED ORDER — HYDROCHLOROTHIAZIDE 25 MG PO TABS: 25.0000 mg | ORAL_TABLET | Freq: Every day | ORAL | 3 refills | Status: DC

## 2021-04-22 MED ORDER — DEXCOM G6 SENSOR MISC: 1.0000 | 3 refills | Status: DC

## 2021-04-22 MED ORDER — DEXCOM G6 TRANSMITTER MISC: 1.0000 | 3 refills | Status: DC

## 2021-04-22 NOTE — Progress Notes (Addendum)
Subjective:    Patient ID: Kenneth Cohen, male    DOB: 1965/06/21, 56 y.o.   MRN: [id number]  HPI  Kenneth Cohen is a very pleasant 56 y.o. male with a significant medical history including uncontrolled type 2 diabetes, hypertension, neuropathy, bilateral BKA, CKD, hyperlipidemia who presents today for follow-up of chronic conditions.  1) Essential Hypertension: Currently managed on amlodipine 10 mg daily, olmesartan 40 mg daily, hydrochlorothiazide 25 mg daily.  He denies chest pain, dizziness, headaches.   BP Readings from Last 3 Encounters:  04/22/21 130/70  12/15/20 112/76  11/12/20 122/68     2) Type 2 Diabetes:  Current medications include: Metformin 1000 mg twice daily, Novolin NPH 50 units twice daily, Novolin regular, 40 units 3 times daily with meals.  Addendum: He is currently managed on an insulin treatment regimen that requires frequent adjustment of fast acting insulin based on glucose readings.   He is checking his blood glucose numerous times throughout the day and is getting readings of:  140's-220's, sometimes worse with eating habits.   Last A1C: 6.8 in October 2022, 7.4 today Last Eye Exam: Up-to-date Last Foot Exam: Up-to-date Pneumonia Vaccination: 2017 Urine Microalbumin: None. Managed on ARB Statin: Atorvastatin  Dietary changes since last visit: Poor diet overall since his right BKA.    Exercise: None   3) Bilateral BKA: He is status post left BKA several years ago secondary to Charcot foot from uncontrolled diabetes.  He is status post right BKA as of November 2022 secondary to chronic ulcer.  Currently following with orthopedic surgery, last visit was in February 2023.  He is planning on following with the [facility] for prosthesis.   4) Insomnia: Chronic for the last 3+ months, intermittent. Is sleeping 3-4 hours nightly, does nap during the day. He realizes that he may have his days and nights mixed up. Difficulty falling asleep as he  lays awake with mind racing thoughts. He's tried melatonin but it causes irritability. He's tried Unisom which is effective.   Overall he denies anxiety, didn't care for antianxiety treatment previously as he did not like the way it made him feel.  Review of Systems  Respiratory:  Negative for shortness of breath.   Cardiovascular:  Negative for chest pain.  Psychiatric/Behavioral:  Positive for sleep disturbance. The patient is not nervous/anxious.         Past Medical History:  Diagnosis Date   Charcot's joint, left ankle and foot 09/07/2015   Diabetic Charcot foot (HCC)    Diabetic infection of right foot (HCC) 12/09/2020   Diabetic ulcer of foot with fat layer exposed (HCC) 11/12/2020   Hypertension    Obesity    Subacute osteomyelitis, right ankle and foot (HCC) 12/09/2020   Testicular torsion    as a child    Social History   Socioeconomic History   Marital status: Married    Spouse name: Not on file   Number of children: Not on file   Years of education: Not on file   Highest education level: Not on file  Occupational History   Not on file  Tobacco Use   Smoking status: Former    Types: Cigarettes    Quit date: 01/20/2004    Years since quitting: 17.2   Smokeless tobacco: Never  Vaping Use   Vaping Use: Every day  Substance and Sexual Activity   Alcohol use: No    Alcohol/week: 0.0 standard drinks    Comment: rarely   Drug  use: No   Sexual activity: Not on file  Other Topics Concern   Not on file  Social History Narrative   Married.   One son, junior in [employer].   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   Enjoys playing on the computer.   Social Determinants of Health   Financial Resource Strain: Not on file  Food Insecurity: Not on file  Transportation Needs: Not on file  Physical Activity: Not on file  Stress: Not on file  Social Connections: Not on file  Intimate Partner Violence: Not on file    Past Surgical History:  Procedure  Laterality Date   AMPUTATION Right 12/13/2020   Procedure: AMPUTATION BELOW KNEE;  Surgeon: Nadara Mustard, MD;  Location: [facility] OR;  Service: Orthopedics;  Laterality: Right;   APPLICATION OF WOUND VAC Right 12/13/2020   Procedure: APPLICATION OF WOUND VAC;  Surgeon: Nadara Mustard, MD;  Location: [facility] OR;  Service: Orthopedics;  Laterality: Right;    Family History  Problem Relation Age of Onset   Hypertension Mother    Diabetes Mother    Diabetes Father     Allergies  Allergen Reactions   Cefaclor Itching, Swelling and Other (See Comments)    Reaction:  All-over body swelling   Did receive and tolerate cefazolin on adm 06/2016    Iodinated Contrast Media Hives, Itching and Other (See Comments)    Immediately after receiving the iv contrast, patient started itching and developed welts on his forehead, back of head, chest and back.  ER Doctor and RN were notified and patient was told he should always be premedicated prior to receiving IV contrast.    Current Outpatient Medications on File Prior to Visit  Medication Sig Dispense Refill   amLODipine (NORVASC) 10 MG tablet TAKE 1 TABLET BY MOUTH EVERY DAY FOR BLOOD PRESSURE 90 tablet 0   atorvastatin (LIPITOR) 20 MG tablet Take 1 tablet (20 mg total) by mouth daily. For cholesterol. 90 tablet 3   insulin NPH Human (NOVOLIN N RELION) 100 UNIT/ML injection Inject 50 units into the skin twice daily. (Patient taking differently: 50 Units in the morning and at bedtime. Inject 50 units into the skin twice daily.) 10 mL 5   insulin regular (NOVOLIN R RELION) 100 units/mL injection Inject 40 units into the skin three times daily before meals for diabetes. 10 mL 5   metFORMIN (GLUCOPHAGE) 500 MG tablet Take 2 tablets (1,000 mg total) by mouth 2 (two) times daily with a meal. For diabetes. 360 tablet 3   olmesartan (BENICAR) 40 MG tablet TAKE 1 TABLET BY MOUTH EVERY DAY FOR BLOOD PRESSURE 90 tablet 0   Current Facility-Administered Medications on  File Prior to Visit  Medication Dose Route Frequency Provider Last Rate Last Admin   mupirocin cream (BACTROBAN) 2 %   Topical BID Hyatt, Max T, DPM        BP 130/70   Pulse 95   SpO2 95%  Objective:   Physical Exam Cardiovascular:     Rate and Rhythm: Normal rate and regular rhythm.  Pulmonary:     Effort: Pulmonary effort is normal.     Breath sounds: Normal breath sounds. No wheezing or rales.  Abdominal:     General: Bowel sounds are normal.     Palpations: Abdomen is soft.     Tenderness: There is no abdominal tenderness.  Musculoskeletal:     Cervical back: Neck supple.  Skin:    General: Skin is warm and  dry.  Neurological:     Mental Status: He is alert and oriented to person, place, and time.          Assessment & Plan:      This visit occurred during the SARS-CoV-2 public health emergency.  Safety protocols were in place, including screening questions prior to the visit, additional usage of staff PPE, and extensive cleaning of exam room while observing appropriate contact time as indicated for disinfecting solutions.

## 2021-04-22 NOTE — Assessment & Plan Note (Signed)
Overall controlled per patient, however it does seem that his anxiety is bothering him at night. ? ?We discussed several over-the-counter options for sleep.  He will update if no improvement, consider low-dose hydroxyzine if needed. ?

## 2021-04-22 NOTE — Assessment & Plan Note (Signed)
Controlled. ? ?Continue amlodipine 10 mg daily, HCTZ 25 mg daily, olmesartan 40 mg daily. ? ?CMP pending. ?

## 2021-04-22 NOTE — Assessment & Plan Note (Addendum)
Overall controlled, but A1C has increased to 7.4 today. He is motivated to get back on track with his diet. He will be exercising soon. ? ?Continue metformin 1000 mg BID, Novolin N 50 units BID, Novolin R 40 units TID with meals.  ? ?Repeat A1C in 3 months. ? ?We will fax Rx for Dexcom sensors and transmitter ? ?

## 2021-04-22 NOTE — Assessment & Plan Note (Signed)
Since November 2022. ?Doing well! ? ?Orthopedic surgery follow-up notes reviewed from February 2023. ? ? ?

## 2021-04-22 NOTE — Assessment & Plan Note (Signed)
Stable. ? ?No concerns today. ?

## 2021-04-22 NOTE — Patient Instructions (Signed)
Stop by the lab prior to leaving today. I will notify you of your results once received.   Please schedule a follow up visit for 3 months.  It was a pleasure to see you today!  

## 2021-04-22 NOTE — Assessment & Plan Note (Signed)
Continue atorvastatin 20 mg. °Repeat lipid panel pending. °

## 2021-05-06 ENCOUNTER — Other Ambulatory Visit: Payer: Self-pay | Admitting: Primary Care

## 2021-05-06 DIAGNOSIS — I10 Essential (primary) hypertension: Secondary | ICD-10-CM

## 2021-06-06 ENCOUNTER — Telehealth: Payer: Self-pay

## 2021-06-06 NOTE — Telephone Encounter (Signed)
Noted. Done. Please re-print and send.

## 2021-06-06 NOTE — Telephone Encounter (Signed)
Kenneth Cohen- with [facility] called stating that they received the form back about addendum notes on patient from April office notes for the use of Dexcom but they need the office note re sent with words to be included beside the addendum with the actual words of Addendum in front of the directions of patient using the Dexcom and how they are used. [phone]. [phone] if any questions and fax number is [phone]

## 2021-06-06 NOTE — Telephone Encounter (Signed)
Spoke to [place] she said the they have received the new note. And see the added addendum. For their approval she said that the information needed to be added to note with actual words addendum added?  Like added to note in another area with it marked as addendum.

## 2021-06-06 NOTE — Telephone Encounter (Signed)
What?? Can you clarify?

## 2021-06-10 NOTE — Telephone Encounter (Signed)
Notes faxed to [phone]. No further action needed.

## 2021-06-12 ENCOUNTER — Telehealth: Payer: Self-pay | Admitting: Primary Care

## 2021-06-12 NOTE — Telephone Encounter (Signed)
I do not see a fax in my inbox regarding Dexcom refills.

## 2021-06-12 NOTE — Telephone Encounter (Signed)
Ppw received via fax for refills. Put in your box for review.

## 2021-06-12 NOTE — Telephone Encounter (Signed)
Reviewed details of this order, original Dexcom order was prior to April 2023. Since April 2023 Medicare CGM requirements have changed, but Felisa Bonier is still asking for documentation of things that are no longer required (ie, multiple daily insulin administrations) so I thought it best to start over with new DME company that is easier to work with.  Ordered Dexcom G7 to [facility] [employer] [facility] via [place]. Will follow up via [facility] updates.

## 2021-06-12 NOTE — Telephone Encounter (Signed)
Spoke with Kenneth Cohen.D. regarding the absolute need for a CGM monitor for this patient.  I am tired of dealing with [facility] as we have gotten nowhere despite proper documentation.  Mardella Layman will work on getting patient assistance for CGM.  Joellen, please notify patient.

## 2021-06-12 NOTE — Telephone Encounter (Signed)
In there now sorry

## 2021-06-12 NOTE — Telephone Encounter (Signed)
Encourage patient to contact the pharmacy for refills or they can request refills through [facility]  Did the patient contact the pharmacy:  N/A  LAST APPOINTMENT DATE:  04/22/2021  MEDICATION:  Continuous Blood Gluc Sensor (DEXCOM G6 SENSOR) MISC  Continuous Blood Gluc Transmit (DEXCOM G6 TRANSMITTER) MISC  Is the patient out of medication? Yes  If not, how much is left? N/A  Is this a 90 day supply: Yes  PHARMACY: [facility]?   Let patient know to contact pharmacy at the end of the day to make sure medication is ready.  Please notify patient to allow 48-72 hours to process

## 2021-06-20 NOTE — Telephone Encounter (Signed)
THANK YOU

## 2021-06-20 NOTE — Telephone Encounter (Addendum)
Per [facility] updates - [facility] has Dexcom ready for delivery and expected delivery to patient 06/20/21 or 06/21/21.

## 2021-07-08 ENCOUNTER — Other Ambulatory Visit: Payer: Self-pay | Admitting: Primary Care

## 2021-07-08 DIAGNOSIS — E785 Hyperlipidemia, unspecified: Secondary | ICD-10-CM

## 2021-07-14 ENCOUNTER — Ambulatory Visit (INDEPENDENT_AMBULATORY_CARE_PROVIDER_SITE_OTHER): Payer: Medicare Other

## 2021-07-14 VITALS — Wt 300.0 lb

## 2021-07-14 DIAGNOSIS — Z0000 Encounter for general adult medical examination without abnormal findings: Secondary | ICD-10-CM | POA: Diagnosis not present

## 2021-07-25 ENCOUNTER — Other Ambulatory Visit: Payer: Self-pay | Admitting: Primary Care

## 2021-07-25 DIAGNOSIS — I10 Essential (primary) hypertension: Secondary | ICD-10-CM

## 2021-07-25 MED ORDER — OLMESARTAN MEDOXOMIL 40 MG PO TABS: 40.0000 mg | ORAL_TABLET | Freq: Every day | ORAL | 2 refills | Status: DC

## 2021-07-25 MED ORDER — AMLODIPINE BESYLATE 10 MG PO TABS: 10.0000 mg | ORAL_TABLET | Freq: Every day | ORAL | 2 refills | Status: DC

## 2021-08-04 DIAGNOSIS — Z794 Long term (current) use of insulin: Secondary | ICD-10-CM

## 2021-08-06 MED ORDER — METFORMIN HCL 500 MG PO TABS: 1000.0000 mg | ORAL_TABLET | Freq: Two times a day (BID) | ORAL | 1 refills | Status: DC

## 2021-08-13 ENCOUNTER — Ambulatory Visit: Payer: Medicare Other | Admitting: Primary Care

## 2021-09-02 ENCOUNTER — Encounter: Payer: Self-pay | Admitting: Primary Care

## 2021-09-02 ENCOUNTER — Ambulatory Visit (INDEPENDENT_AMBULATORY_CARE_PROVIDER_SITE_OTHER): Payer: Medicare Other | Admitting: Primary Care

## 2021-09-02 VITALS — BP 134/68 | HR 103 | Temp 98.6°F

## 2021-09-02 DIAGNOSIS — E118 Type 2 diabetes mellitus with unspecified complications: Secondary | ICD-10-CM

## 2021-09-02 DIAGNOSIS — Z89511 Acquired absence of right leg below knee: Secondary | ICD-10-CM | POA: Diagnosis not present

## 2021-09-02 DIAGNOSIS — Z89512 Acquired absence of left leg below knee: Secondary | ICD-10-CM | POA: Diagnosis not present

## 2021-09-02 LAB — POCT GLYCOSYLATED HEMOGLOBIN (HGB A1C): Hemoglobin A1C: 7.4 % — AB (ref 4.0–5.6)

## 2021-09-02 NOTE — Progress Notes (Signed)
Subjective:    Patient ID: Kenneth Cohen, male    DOB: 05/10/1965, 56 y.o.   MRN: [id number]  HPI  Kenneth Cohen is a very pleasant 56 y.o. male with a history of type 2 diabetes, CKD, hyperlipidemia, bilateral BKA, anxiety and depression, neuropathy who presents today for follow up of diabetes and to discuss prosthesis for right BKA.  1) Type 2 Diabetes:  Current medications include: metformin 1000 mg BID, NPH 50 units BID, Novolog 40 units TID with meals  He is checking his blood glucose continuously and is getting readings of 150's-mid 200's.   Last A1C: 7.4 in April 2023, 7.4 today Last Eye Exam: UTD Last Foot Exam: N/A Pneumonia Vaccination: 2017 Urine Microalbumin: None. Managed on olmesartan Statin: atorvastatin   Dietary changes since last visit: None.    Exercise: Lifting some weights  2) History of Bilateral BKA: History of left BKA in 2018 and history of right BKA in 2022. He is requesting a new prosthesis for his left BKA and is now needing a new prosthesis for the right BKA.   He comes with a letter today requesting prosthesis through the [facility] which requests specific documentation requirements for insurance coverage. He has been fitted and is ready to go for both of his prostheses.   Prior to his left BKA in 2018 he was very active. Once outfitted with his left BKA he remained active. Since his right BKA he's become more sedentary due to immobility. He is excited to become active again once he has both prostheses. He is "tired of staring at four walls all day".  He has an appointment today with the [facility].   He has followed with vascular services as requested, all wounds have healed per his wife.   BP Readings from Last 3 Encounters:  09/02/21 134/68  04/22/21 130/70  12/15/20 112/76      Review of Systems  Respiratory:  Negative for shortness of breath.   Cardiovascular:  Negative for chest pain.  Skin:  Negative for color change and  rash.  Neurological:  Negative for dizziness and headaches.         Past Medical History:  Diagnosis Date   Charcot's joint, left ankle and foot 09/07/2015   Diabetic Charcot foot (HCC)    Diabetic infection of right foot (HCC) 12/09/2020   Diabetic ulcer of foot with fat layer exposed (HCC) 11/12/2020   Hypertension    Obesity    Subacute osteomyelitis, right ankle and foot (HCC) 12/09/2020   Testicular torsion    as a child    Social History   Socioeconomic History   Marital status: Married    Spouse name: Not on file   Number of children: Not on file   Years of education: Not on file   Highest education level: Not on file  Occupational History   Not on file  Tobacco Use   Smoking status: Former    Types: Cigarettes    Quit date: 01/20/2004    Years since quitting: 17.6   Smokeless tobacco: Never  Vaping Use   Vaping Use: Every day  Substance and Sexual Activity   Alcohol use: No    Alcohol/week: 0.0 standard drinks of alcohol    Comment: rarely   Drug use: No   Sexual activity: Not on file  Other Topics Concern   Not on file  Social History Narrative   Married.   One son, junior in [employer].   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   Enjoys playing on the computer.   Social Determinants of Health   Financial Resource Strain: Low Risk  (07/14/2021)   Overall Financial Resource Strain (CARDIA)    Difficulty of Paying Living Expenses: Not hard at all  Food Insecurity: No Food Insecurity (07/14/2021)   Hunger Vital Sign    Worried About Running Out of Food in the Last Year: Never true    Ran Out of Food in the Last Year: Never true  Transportation Needs: No Transportation Needs (07/14/2021)   PRAPARE - Administrator, Civil Service (Medical): No    Lack of Transportation (Non-Medical): No  Physical Activity: Inactive (07/14/2021)   Exercise Vital Sign    Days of Exercise per Week: 0 days    Minutes of Exercise per Session: 0 min  Stress: No  Stress Concern Present (07/14/2021)   [employer] of Occupational Health - Occupational Stress Questionnaire    Feeling of Stress : Not at all  Social Connections: Not on file  Intimate Partner Violence: Not on file    Past Surgical History:  Procedure Laterality Date   AMPUTATION Right 12/13/2020   Procedure: AMPUTATION BELOW KNEE;  Surgeon: Nadara Mustard, MD;  Location: [facility] OR;  Service: Orthopedics;  Laterality: Right;   APPLICATION OF WOUND VAC Right 12/13/2020   Procedure: APPLICATION OF WOUND VAC;  Surgeon: Nadara Mustard, MD;  Location: [facility] OR;  Service: Orthopedics;  Laterality: Right;    Family History  Problem Relation Age of Onset   Hypertension Mother    Diabetes Mother    Diabetes Father     Allergies  Allergen Reactions   Cefaclor Itching, Swelling and Other (See Comments)    Reaction:  All-over body swelling   Did receive and tolerate cefazolin on adm 06/2016    Iodinated Contrast Media Hives, Itching and Other (See Comments)    Immediately after receiving the iv contrast, patient started itching and developed welts on his forehead, back of head, chest and back.  ER Doctor and RN were notified and patient was told he should always be premedicated prior to receiving IV contrast.   Wound Dressing Adhesive Itching and Rash    Current Outpatient Medications on File Prior to Visit  Medication Sig Dispense Refill   amLODipine (NORVASC) 10 MG tablet Take 1 tablet (10 mg total) by mouth daily. for blood pressure. 90 tablet 2   atorvastatin (LIPITOR) 20 MG tablet TAKE 1 TABLET BY MOUTH EVERY DAY FOR CHOLESTEROL 90 tablet 2   Continuous Blood Gluc Sensor (DEXCOM G6 SENSOR) MISC 1 each by Does not apply route as directed. Apply every 10 days for blood sugar monitoring. 9 each 3   Continuous Blood Gluc Transmit (DEXCOM G6 TRANSMITTER) MISC 1 each by Does not apply route as directed. 1 each 3   hydrochlorothiazide (HYDRODIURIL) 25 MG tablet Take 1 tablet (25 mg total) by  mouth daily. For blood pressure. 90 tablet 3   insulin NPH Human (NOVOLIN N RELION) 100 UNIT/ML injection Inject 50 units into the skin twice daily. (Patient taking differently: 50 Units in the morning and at bedtime. Inject 50 units into the skin twice daily.) 10 mL 5   insulin regular (NOVOLIN R RELION) 100 units/mL injection Inject 40 units into the skin three times daily before meals for diabetes. 10 mL 5   metFORMIN (GLUCOPHAGE) 500 MG tablet Take 2 tablets (1,000 mg total) by mouth 2 (two) times daily with a meal.  For diabetes. 360 tablet 1   olmesartan (BENICAR) 40 MG tablet Take 1 tablet (40 mg total) by mouth daily. for blood pressure. 90 tablet 2   Current Facility-Administered Medications on File Prior to Visit  Medication Dose Route Frequency Provider Last Rate Last Admin   mupirocin cream (BACTROBAN) 2 %   Topical BID Hyatt, Max T, DPM        BP 134/68   Pulse (!) 103   Temp 98.6 F (37 C) (Oral)   SpO2 97%  Objective:   Physical Exam Cardiovascular:     Rate and Rhythm: Normal rate and regular rhythm.  Pulmonary:     Effort: Pulmonary effort is normal.     Breath sounds: Normal breath sounds. No wheezing or rales.  Musculoskeletal:     Cervical back: Neck supple.  Skin:    General: Skin is warm and dry.     Findings: No erythema.     Comments: Completely healed stumps of bilateral BKA's. No skin breakdown or erythema.   Neurological:     Mental Status: He is alert and oriented to person, place, and time.           Assessment & Plan:   Problem List Items Addressed This Visit       Endocrine   Diabetes mellitus type 2 with complications (HCC) - Primary    Controlled with A1C of 7.4 today.  Continue NPH insulin at 50 units BID, Novolog 40-50 units TID with meals, metformin 1000 mg BID.  Follow up in 6 months.      Relevant Orders   POCT glycosylated hemoglobin (Hb A1C) (Completed)     Other   Hx of BKA, left (HCC)    Stump appears well cared  for.  Patient is a current K 3 level and has no co-morbidities that will impact mobility or ability to function with prothesis.  I am in agreement with the prosthetic plan of care per the [facility], and patient will be a K3 level once prosthesis is received.       Hx of BKA, right (HCC)    Stump completely healed and well cared for.  Patient is a current K 3 level and has no co-morbidities that will impact mobility or ability to function with prothesis.  I am in agreement with the prosthetic plan of care per the [facility], and patient will be a K3 level once prosthesis is received.    Will fax HPI and assessment and plan notes to [facility].          Doreene Nest, NP

## 2021-09-02 NOTE — Assessment & Plan Note (Addendum)
Stump completely healed and well cared for.  Patient is a current K 3 level and has no co-morbidities that will impact mobility or ability to function with prothesis.  I am in agreement with the prosthetic plan of care per the [facility], and patient will be a K3 level once prosthesis is received.    Will fax HPI and assessment and plan notes to [facility].

## 2021-09-02 NOTE — Patient Instructions (Signed)
We will fax our notes to the [facility] this afternoon.  Schedule a follow up visit for 6 months.  It was a pleasure to see you today!

## 2021-09-02 NOTE — Assessment & Plan Note (Signed)
Controlled with A1C of 7.4 today.  Continue NPH insulin at 50 units BID, Novolog 40-50 units TID with meals, metformin 1000 mg BID.  Follow up in 6 months.

## 2021-09-02 NOTE — Assessment & Plan Note (Signed)
Stump appears well cared for.  Patient is a current K 3 level and has no co-morbidities that will impact mobility or ability to function with prothesis.  I am in agreement with the prosthetic plan of care per the [facility], and patient will be a K3 level once prosthesis is received.

## 2021-09-03 ENCOUNTER — Telehealth: Payer: Self-pay | Admitting: Primary Care

## 2021-09-03 NOTE — Telephone Encounter (Signed)
Joann called in from [facility] about mutual patient. Stated that she has some questions about office notes that were sent over on yesterday 8/1/523. She would like a phone call at [phone].

## 2021-09-03 NOTE — Telephone Encounter (Signed)
Called [facility] back. They stat that each point needs to be addressed completely or insurance will not cover. I have placed form back in your folder for reference.

## 2021-09-04 NOTE — Telephone Encounter (Signed)
Each point WAS addressed. Have them review each section of my notes including HPI, physical exam, and assessment and plan. I placed stars by each point that I addressed.

## 2021-09-08 NOTE — Telephone Encounter (Signed)
Left message to return call to our office.  

## 2021-09-10 NOTE — Telephone Encounter (Signed)
Called Joann  back at [facility] [place] and was advised that she is out this week. Kenneth Cohen was given the information per Graylon Gunning note.  Kenneth Cohen stated that she will discuss this with [facility] when she comes back next week. Kenneth Cohen stated that they will call back if any further information is needed.

## 2021-09-19 DIAGNOSIS — Z89512 Acquired absence of left leg below knee: Secondary | ICD-10-CM | POA: Diagnosis not present

## 2021-09-19 DIAGNOSIS — Z89511 Acquired absence of right leg below knee: Secondary | ICD-10-CM | POA: Diagnosis not present

## 2021-09-19 DIAGNOSIS — Z4781 Encounter for orthopedic aftercare following surgical amputation: Secondary | ICD-10-CM | POA: Diagnosis not present

## 2022-03-03 ENCOUNTER — Other Ambulatory Visit: Payer: Self-pay | Admitting: Primary Care

## 2022-03-03 DIAGNOSIS — I10 Essential (primary) hypertension: Secondary | ICD-10-CM

## 2022-03-03 NOTE — Telephone Encounter (Signed)
Noted  

## 2022-03-03 NOTE — Telephone Encounter (Signed)
Please call patient:  See other note. Same thing for Olmesartan for BP. Should have enough until April

## 2022-03-03 NOTE — Telephone Encounter (Signed)
Called and spoke with patient, he will check with pharmacy as he should have refills still with them.  He cannot schedule the appointment right now, as he is unsure of his schedule. He will call back as soon as he knows his schedule.

## 2022-03-03 NOTE — Telephone Encounter (Signed)
Please call patient:  Received refill request for amlodipine for BP. Looks like he should have enough to last until April. Is this the case?  Also due for diabetes/general follow up now. Please schedule.

## 2022-04-23 NOTE — Telephone Encounter (Signed)
Per media tab it looks like [employer] [facility] faxed [place] a request in February for chart notes within the last 6 months (part of Medicare requirements to continue CGM is patient must have OV every 6 months). His last OV was 09/02/21 so I believe he will need to schedule an OV, and then we can fax those notes to [place] [phone]).

## 2022-04-23 NOTE — Telephone Encounter (Signed)
Mendel Ryder, Do you remember how we went about getting his Dexcom sensors?  Allie Bossier, NP-C

## 2022-05-13 ENCOUNTER — Other Ambulatory Visit: Payer: Self-pay | Admitting: Primary Care

## 2022-05-13 DIAGNOSIS — I10 Essential (primary) hypertension: Secondary | ICD-10-CM

## 2022-11-03 ENCOUNTER — Ambulatory Visit: Payer: Medicare Other | Admitting: Primary Care

## 2022-11-26 ENCOUNTER — Ambulatory Visit: Payer: Medicare Other | Admitting: Primary Care

## 2022-12-02 ENCOUNTER — Ambulatory Visit: Payer: Medicare Other | Admitting: Primary Care

## 2022-12-02 ENCOUNTER — Other Ambulatory Visit: Payer: Self-pay | Admitting: Primary Care

## 2022-12-02 ENCOUNTER — Encounter: Payer: Self-pay | Admitting: Primary Care

## 2022-12-02 VITALS — BP 142/78 | HR 94 | Temp 97.8°F | Wt 300.0 lb

## 2022-12-02 DIAGNOSIS — E118 Type 2 diabetes mellitus with unspecified complications: Secondary | ICD-10-CM | POA: Diagnosis not present

## 2022-12-02 DIAGNOSIS — Z1211 Encounter for screening for malignant neoplasm of colon: Secondary | ICD-10-CM

## 2022-12-02 DIAGNOSIS — E785 Hyperlipidemia, unspecified: Secondary | ICD-10-CM | POA: Diagnosis not present

## 2022-12-02 DIAGNOSIS — Z125 Encounter for screening for malignant neoplasm of prostate: Secondary | ICD-10-CM | POA: Diagnosis not present

## 2022-12-02 DIAGNOSIS — Z23 Encounter for immunization: Secondary | ICD-10-CM | POA: Diagnosis not present

## 2022-12-02 DIAGNOSIS — F4322 Adjustment disorder with anxiety: Secondary | ICD-10-CM

## 2022-12-02 DIAGNOSIS — Z7984 Long term (current) use of oral hypoglycemic drugs: Secondary | ICD-10-CM | POA: Diagnosis not present

## 2022-12-02 DIAGNOSIS — I10 Essential (primary) hypertension: Secondary | ICD-10-CM

## 2022-12-02 LAB — CBC
HCT: 40.8 % (ref 39.0–52.0)
Hemoglobin: 13.9 g/dL (ref 13.0–17.0)
MCHC: 34 g/dL (ref 30.0–36.0)
MCV: 90.4 fL (ref 78.0–100.0)
Platelets: 207 10*3/uL (ref 150.0–400.0)
RBC: 4.52 Mil/uL (ref 4.22–5.81)
RDW: 15.3 % (ref 11.5–15.5)
WBC: 7.7 10*3/uL (ref 4.0–10.5)

## 2022-12-02 LAB — LIPID PANEL
Cholesterol: 138 mg/dL (ref 0–200)
HDL: 30 mg/dL — ABNORMAL LOW (ref >39.00)
LDL Cholesterol: 77 mg/dL (ref 0–99)
NonHDL: 108.49
Total CHOL/HDL Ratio: 5
Triglycerides: 155 mg/dL — ABNORMAL HIGH (ref 0.0–149.0)
VLDL: 31 mg/dL (ref 0.0–40.0)

## 2022-12-02 LAB — COMPREHENSIVE METABOLIC PANEL
ALT: 54 U/L — ABNORMAL HIGH (ref 0–53)
AST: 32 U/L (ref 0–37)
Albumin: 3.8 g/dL (ref 3.5–5.2)
Alkaline Phosphatase: 64 U/L (ref 39–117)
BUN: 18 mg/dL (ref 6–23)
CO2: 29 meq/L (ref 19–32)
Calcium: 9.4 mg/dL (ref 8.4–10.5)
Chloride: 101 meq/L (ref 96–112)
Creatinine, Ser: 1.13 mg/dL (ref 0.40–1.50)
GFR: 72.54 mL/min (ref >60.00)
Glucose, Bld: 217 mg/dL — ABNORMAL HIGH (ref 70–99)
Potassium: 4.1 meq/L (ref 3.5–5.1)
Sodium: 137 meq/L (ref 135–145)
Total Bilirubin: 0.5 mg/dL (ref 0.2–1.2)
Total Protein: 6.9 g/dL (ref 6.0–8.3)

## 2022-12-02 LAB — PSA, MEDICARE: PSA: 0.41 ng/mL (ref 0.10–4.00)

## 2022-12-02 LAB — POCT GLYCOSYLATED HEMOGLOBIN (HGB A1C): Hemoglobin A1C: 10 % — AB (ref 4.0–5.6)

## 2022-12-02 MED ORDER — RELION MINI PEN NEEDLES 31G X 6 MM MISC: 3 refills | Status: DC

## 2022-12-02 MED ORDER — AMLODIPINE BESYLATE 10 MG PO TABS: 10.0000 mg | ORAL_TABLET | Freq: Every day | ORAL | 3 refills | Status: DC

## 2022-12-02 MED ORDER — GLIPIZIDE ER 5 MG PO TB24: 5.0000 mg | ORAL_TABLET | Freq: Every day | ORAL | 1 refills | Status: DC

## 2022-12-02 MED ORDER — HYDROCHLOROTHIAZIDE 25 MG PO TABS: 25.0000 mg | ORAL_TABLET | Freq: Every day | ORAL | 3 refills | Status: DC

## 2022-12-02 MED ORDER — INSULIN NPH (HUMAN) (ISOPHANE) 100 UNIT/ML ~~LOC~~ SUSP: 70.0000 [IU] | Freq: Two times a day (BID) | SUBCUTANEOUS | 0 refills | Status: DC

## 2022-12-02 MED ORDER — NOVOLIN R FLEXPEN RELION 100 UNIT/ML IJ SOPN: 60.0000 [IU] | PEN_INJECTOR | Freq: Three times a day (TID) | INTRAMUSCULAR | 0 refills | Status: DC

## 2022-12-02 MED ORDER — METFORMIN HCL ER 500 MG PO TB24: 1000.0000 mg | ORAL_TABLET | Freq: Every day | ORAL | 0 refills | Status: DC

## 2022-12-02 MED ORDER — OLMESARTAN MEDOXOMIL 40 MG PO TABS: 40.0000 mg | ORAL_TABLET | Freq: Every day | ORAL | 3 refills | Status: DC

## 2022-12-02 NOTE — Patient Instructions (Addendum)
Stop by the lab prior to leaving today. I will notify you of your results once received.   Complete the Cologuard kit once received.  This is for colon cancer screening.  Start glipizide XL 5 mg once daily in the morning for diabetes.  We changed your metformin to the ER version.  Take metformin ER 500 mg, 2 tablets every day with breakfast for diabetes.  Continue your insulin as discussed for now.  I will have my pharmacist get in touch with you soon.  Please send me blood pressure readings via MyChart in 2 weeks.  Please schedule a follow up visit for 3 months.  It was a pleasure to see you today!

## 2022-12-02 NOTE — Assessment & Plan Note (Signed)
Deteriorated.  Discussed options for treatment, he declines today. Will re-evaluate in 3 months.   He will update sooner if he changes his mind.

## 2022-12-02 NOTE — Assessment & Plan Note (Signed)
Repeat lipid panel pending.  Refills provided for atorvastatin 20 mg daily. He has not been taking consistently.

## 2022-12-02 NOTE — Assessment & Plan Note (Signed)
Uncontrolled with A1C of 10.1 today.   Change metformin to ER version, 1000 mg daily. Start Glipizide XL 5 mg daily.  Continue NPH 70 units BID for now. Continue Novolin R 60 units TID with meals for now.  Will get pharmacy involved in patient's care for insulin review.   Continue Dexcom G7 sensors continuously which has now become cost prohibitive. Will inquire about patient assistance.  Would recommend Mounjaro or Ozempic, however he does not have medicare prescription drug plan. Will have pharmacy review for patient assistance.   Follow up in 3 months

## 2022-12-02 NOTE — Assessment & Plan Note (Addendum)
Above goal today, also has not been consistently on antihypertensive medications in months.  Resume olmesartan 40 mg daily, amlodipine 10 mg daily, hydrochlorothiazide 25 mg daily.  CMP pending today.   Recommended he start checking BP at home and send readings via MyChart in 2 weeks.

## 2022-12-02 NOTE — Progress Notes (Signed)
Subjective:    Patient ID: Kenneth Cohen, male    DOB: [birth year], 57 y.o.   MRN: [id number]  HPI  Kenneth Cohen is a very pleasant 57 y.o. male with a significant medical history including hypertension, chronic venous insufficiency, type 2 diabetes, bilateral BKA, anxiety depression, hyperlipidemia, CKD who presents today for follow-up of chronic conditions.  He has not been seen in our office since August 2023.  1) Type 2 Diabetes:   Current medications include: Metformin 1000 mg twice daily, NPH 50 units twice daily, Novolin R 40 units 3 times daily with meals.  He took it upon himself to inject 70 units of NPH twice daily and 60 units of Novolin R three times daily, sometimes twice daily.   He has not taken his metformin in >1 year due to GI upset.   He is checking his blood glucose continuously and is getting readings of:  AM fasting: low to high 200s Afternoon: low to high 200s Evening: low to high 200s  Last A1C: 7.4 in August 2023, 10.0 today Last Eye Exam: Due Last Foot Exam: Not applicable Pneumonia Vaccination: 2017 Urine Microalbumin: Due Statin: Atorvastatin  Dietary changes since last visit: Home cooked meals mostly (sandwiches, frozen foods).    Exercise: None  2) Hypertension: Currently managed on hydrochlorothiazide 25 mg daily, amlodipine 10 mg daily, olmesartan 40 mg daily.  He has not taken his BP meds consistently as he ran out. He is not checking his BP at home.   BP Readings from Last 3 Encounters:  12/02/22 (!) 142/78  09/02/21 134/68  04/22/21 130/70   He denies chest pain, dizziness, headaches.   3) Hyperlipidemia: Currently managed on atorvastatin 20 mg daily.  He is due for repeat lipid panel today.  4) GAD: Chronic. Increased anxiety over the years. Symptoms include worrying, feeling anxious, feeling irritable, feeling down at times.   He is not interested in treatment.      12/02/2022    7:31 AM  GAD 7 : Generalized Anxiety  Score  Nervous, Anxious, on Edge 0  Control/stop worrying 0  Worry too much - different things 0  Trouble relaxing 0  Restless 0  Easily annoyed or irritable 0  Afraid - awful might happen 0  Total GAD 7 Score 0  Anxiety Difficulty Not difficult at all       12/02/2022    7:31 AM 07/14/2021   11:08 AM 04/22/2021    2:30 PM  PHQ9 SCORE ONLY  PHQ-9 Total Score 2 0 0     Review of Systems  Eyes:  Negative for visual disturbance.  Respiratory:  Negative for shortness of breath.   Cardiovascular:  Negative for chest pain.  Gastrointestinal:  Negative for constipation and diarrhea.  Neurological:  Negative for dizziness and headaches.  Psychiatric/Behavioral:  The patient is nervous/anxious.          Past Medical History:  Diagnosis Date   Charcot's joint, left ankle and foot 09/07/2015   Diabetic Charcot foot (HCC)    Diabetic infection of right foot (HCC) 12/09/2020   Diabetic ulcer of foot with fat layer exposed (HCC) 11/12/2020   Hypertension    Obesity    Subacute osteomyelitis, right ankle and foot (HCC) 12/09/2020   Testicular torsion    as a child    Social History   Socioeconomic History   Marital status: Married    Spouse name: Not on file   Number of children: Not on file  Years of education: Not on file   Highest education level: Some college, no degree  Occupational History   Not on file  Tobacco Use   Smoking status: Some Days    Types: E-cigarettes   Smokeless tobacco: Never  Vaping Use   Vaping status: Every Day  Substance and Sexual Activity   Alcohol use: No    Alcohol/week: 0.0 standard drinks of alcohol    Comment: rarely   Drug use: No   Sexual activity: Not on file  Other Topics Concern   Not on file  Social History Narrative   Married.   One son, junior in [employer].   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   Enjoys playing on the computer.   Social Determinants of Health   Financial Resource Strain: Low Risk   (12/01/2022)   Overall Financial Resource Strain (CARDIA)    Difficulty of Paying Living Expenses: Not hard at all  Food Insecurity: No Food Insecurity (12/01/2022)   Hunger Vital Sign    Worried About Running Out of Food in the Last Year: Never true    Ran Out of Food in the Last Year: Never true  Transportation Needs: No Transportation Needs (12/01/2022)   PRAPARE - Administrator, Civil Service (Medical): No    Lack of Transportation (Non-Medical): No  Physical Activity: Unknown (12/01/2022)   Exercise Vital Sign    Days of Exercise per Week: 0 days    Minutes of Exercise per Session: Not on file  Stress: No Stress Concern Present (12/01/2022)   [employer] of Occupational Health - Occupational Stress Questionnaire    Feeling of Stress : Only a little  Social Connections: Moderately Isolated (12/01/2022)   Social Connection and Isolation Panel [NHANES]    Frequency of Communication with Friends and Family: More than three times a week    Frequency of Social Gatherings with Friends and Family: More than three times a week    Attends Religious Services: Never    Database administrator or Organizations: No    Attends Banker Meetings: Not on file    Marital Status: Married  Catering manager Violence: Not on file    Past Surgical History:  Procedure Laterality Date   AMPUTATION Right 12/13/2020   Procedure: AMPUTATION BELOW KNEE;  Surgeon: Nadara Mustard, MD;  Location: [facility] OR;  Service: Orthopedics;  Laterality: Right;   APPLICATION OF WOUND VAC Right 12/13/2020   Procedure: APPLICATION OF WOUND VAC;  Surgeon: Nadara Mustard, MD;  Location: [facility] OR;  Service: Orthopedics;  Laterality: Right;    Family History  Problem Relation Age of Onset   Hypertension Mother    Diabetes Mother    Diabetes Father     Allergies  Allergen Reactions   Cefaclor Itching, Swelling and Other (See Comments)    Reaction:  All-over body swelling   Did receive and  tolerate cefazolin on adm 06/2016    Iodinated Contrast Media Hives, Itching and Other (See Comments)    Immediately after receiving the iv contrast, patient started itching and developed welts on his forehead, back of head, chest and back.  ER Doctor and RN were notified and patient was told he should always be premedicated prior to receiving IV contrast.   Wound Dressing Adhesive Itching and Rash    Current Outpatient Medications on File Prior to Visit  Medication Sig Dispense Refill   atorvastatin (LIPITOR) 20 MG tablet TAKE 1 TABLET BY MOUTH EVERY DAY  FOR CHOLESTEROL 90 tablet 2   Current Facility-Administered Medications on File Prior to Visit  Medication Dose Route Frequency Provider Last Rate Last Admin   mupirocin cream (BACTROBAN) 2 %   Topical BID Hyatt, Max T, DPM        BP (!) 142/78   Pulse 94   Temp 97.8 F (36.6 C) (Temporal)   Wt 300 lb (136.1 kg) Comment: patient reported  SpO2 98%   BMI 34.67 kg/m  Objective:   Physical Exam Cardiovascular:     Rate and Rhythm: Normal rate and regular rhythm.  Pulmonary:     Effort: Pulmonary effort is normal.     Breath sounds: Normal breath sounds.  Abdominal:     General: Bowel sounds are normal.     Palpations: Abdomen is soft.     Tenderness: There is no abdominal tenderness.  Musculoskeletal:     Cervical back: Neck supple.  Skin:    General: Skin is warm and dry.  Neurological:     Mental Status: He is alert and oriented to person, place, and time.  Psychiatric:        Mood and Affect: Mood normal.           Assessment & Plan:  Diabetes mellitus type 2 with complications (HCC) Assessment & Plan: Uncontrolled with A1C of 10.1 today.   Change metformin to ER version, 1000 mg daily. Start Glipizide XL 5 mg daily.  Continue NPH 70 units BID for now. Continue Novolin R 60 units TID with meals for now.  Will get pharmacy involved in patient's care for insulin review.   Continue Dexcom G7 sensors  continuously which has now become cost prohibitive. Will inquire about patient assistance.  Would recommend Mounjaro or Ozempic, however he does not have medicare prescription drug plan. Will have pharmacy review for patient assistance.   Follow up in 3 months  Orders: -     POCT glycosylated hemoglobin (Hb A1C) -     metFORMIN HCl ER; Take 2 tablets (1,000 mg total) by mouth daily with breakfast. for diabetes.  Dispense: 180 tablet; Refill: 0 -     Insulin NPH (Human) (Isophane); Inject 0.7 mLs (70 Units total) into the skin 2 (two) times daily before a meal. Inject 50 units into the skin twice daily.  Dispense: 100 mL; Refill: 0 -     NovoLIN R FlexPen ReliOn; Inject 60 Units as directed 3 (three) times daily. With meals for diabetes  Dispense: 180 mL; Refill: 0 -     ReliOn Mini Pen Needles; Use three times daily with insulin  Dispense: 360 each; Refill: 3 -     glipiZIDE ER; Take 1 tablet (5 mg total) by mouth daily with breakfast. for diabetes.  Dispense: 90 tablet; Refill: 1  Essential hypertension Assessment & Plan: Above goal today, also has not been consistently on antihypertensive medications in months.  Resume olmesartan 40 mg daily, amlodipine 10 mg daily, hydrochlorothiazide 25 mg daily.  CMP pending today.   Recommended he start checking BP at home and send readings via MyChart in 2 weeks.  Orders: -     Comprehensive metabolic panel -     CBC -     amLODIPine Besylate; Take 1 tablet (10 mg total) by mouth daily. for blood pressure.  Dispense: 90 tablet; Refill: 3 -     hydroCHLOROthiazide; Take 1 tablet (25 mg total) by mouth daily. For blood pressure.  Dispense: 90 tablet; Refill: 3 -  Olmesartan Medoxomil; Take 1 tablet (40 mg total) by mouth daily. for blood pressure.  Dispense: 90 tablet; Refill: 3  Adjustment disorder with anxiety Assessment & Plan: Deteriorated.  Discussed options for treatment, he declines today. Will re-evaluate in 3 months.   He will  update sooner if he changes his mind.   Hyperlipidemia, unspecified hyperlipidemia type Assessment & Plan: Repeat lipid panel pending.  Refills provided for atorvastatin 20 mg daily. He has not been taking consistently.   Orders: -     Lipid panel  Screening for prostate cancer -     PSA, Medicare  Screening for colon cancer -     Cologuard  Encounter for immunization -     Flu vaccine trivalent PF, 6mos and older(Flulaval,Afluria,Fluarix,Fluzone)        Doreene Nest, NP

## 2022-12-04 ENCOUNTER — Other Ambulatory Visit: Payer: Medicare Other | Admitting: Pharmacist

## 2022-12-04 NOTE — Progress Notes (Unsigned)
12/07/2022 Name: Kenneth Cohen MRN: [id number] DOB: 29-Mar-1965  Subjective  Chief Complaint  Patient presents with   Diabetes   Medication Access    Reason for visit: ?  Kenneth Cohen is a 57 y.o. male with a history of diabetes (type 2), who presents today for an initial diabetes pharmacotherapy visit.? Pertinent PMH also includes HTN, HLD, chronic venous insufficiency (2022 exam/noninvasive assessment w no evidence of significant PAD), neuropathy, anxiety.  Known DM Complications: bilateral BK amputation, diabetic kidney disease, neuropathy  Care Team: Primary Care Provider: Doreene Nest, NP   Date of Last Diabetes Related Visit: with PCP on 12/02/22   Recent Summary of Change: Re-establishing care after >1 yr. Off of metformin >1 yr 2/2 GI upset Start metformin XR, start glipizide 5 mg with breakfast  Medication Access/Adherence: Prescription drug coverage: Payor: MEDICARE / Plan: MEDICARE PART A AND B / Product Type: *No Product type* / .  Reports that all medications are relatively affordable  Can afford cost of current OTC insulins, though cost is a barrier to prescription insulins and brand-name medications.  Estimates spending ~$100/month on his insulins (Novolog R and N vials). Since switching to insulin pens, cost ~$75/month.   Current Patient Assistance: None Patient lives in a household of 2 with his wife. He is not working, receives only [employer]. Wife works. Estimated annual combined income is $3300-34000.  History of Present Illness/Since Last Visit: ?  Patient reports long-term use of insulin (>10 years). Historically, cost of prescription insulin products has been a barrier. Has never been on long-acting basal/rapid acting bolus. Has therefore been taking OTC Relion ([employer]) insulin for many years. Denies concerns since switching to insulin pens, has found use easier with relatively lower cost.   Patient reports has not yet started metformin ER or glipizide  at this time but does plan to start it once it is ready to pick up at the pharmacy.   Reported DM Regimen: ?  Glipizide 5 mg  once daily before breakfast  - Not started Metformin XR 1000 mg daily with breakfast - Not started Novolin N 70 units twice daily Novolin R 60 units TID   DM medications tried in the past:?  Metformin IR (stomach trouble)  SMBG: Dexcom G7 (uses reader, thinks phone is incompatible. Not connected to clinic portal)  Hypo/Hyperglycemia: ?  Symptoms of hypoglycemia since last visit:? no   Symptoms of hyperglycemia since last visit:? no - none  Reported Diet: Patient typically eats 3 meals per day. Tries to be mindful of eating food prepared at home rather than fast food.   Exercise: Trying to increase walking/movement throughout the day.   DM Prevention:  Statin: Taking; moderate intensity.?  History of chronic kidney disease? yes History of albuminuria?  No documented history , last UACR on 06/26/14 = 1.4 mg/g ACE/ARB - Taking olmesartan 40 mg daily; Urine MA/CR Ratio -  Due .  Last eye exam: Unclear, overdue Last foot exam: 11/12/2020 (N/A bilateral BKA) Tobacco Use: Former smoker. Vaping occasionally. No cigarettes in 20+ years.   Immunizations:? Flu: Up to date (Last: 12/02/2022); Pneumococcal: PPSV23 (12/2015) PCV13 (09/2014); Shingrix: No Record - DUE; Covid (No record - DUE)  Cardiovascular Risk Reduction History of clinical ASCVD? no (2022 evaluation of diabetic foot infection revealed no c/f significant PAD) History of heart failure? no  History of hyperlipidemia? yes Current BMI: Not calculated, no height s/p BKA (Wt 136.1 kg 12/02/22) Taking statin? yes; moderate intensity (atorvastatin 20 mg) Taking  aspirin? not indicated; Not taking   Taking SGLT-2i? no Taking GLP- 1 RA? no   Reported HTN Regimen: ?  Amlodipine 10 mg daily Hydrochlorothiazide 25 mg daily Olmesartan 40 mg daily  Patient is not checking their blood pressure at home regularly  though confirms plan to start checking per PCP request. Has BP cuff.   Patient denies hypotensive s/sx. No dizziness, lightheadedness.  Patient denies hypertensive symptoms. No headache, chest pain, shortness of breath, visual changes.      _______________________________________________  Objective    Review of Systems:? Limited in the setting of virtual visit  Constitutional:? No fever, chills or unintentional weight loss  Cardiovascular:? No chest pain or pressure, shortness of breath, dyspnea on exertion Pulmonary:? No cough or shortness of breath  GI:? No nausea, vomiting, constipation, diarrhea, abdominal pain, dyspepsia, change in bowel habits  Endocrine:? No polyuria, polyphagia or blurred vision    Physical Examination:  Vitals:  Wt Readings from Last 3 Encounters:  12/02/22 300 lb (136.1 kg)  07/14/21 300 lb (136.1 kg)  12/15/20 (!) 326 lb 1 oz (147.9 kg)   BP Readings from Last 3 Encounters:  12/02/22 (!) 142/78  09/02/21 134/68  04/22/21 130/70   Pulse Readings from Last 3 Encounters:  12/02/22 94  09/02/21 (!) 103  04/22/21 95     Labs:?  Lab Results  Component Value Date   HGBA1C 10.0 (A) 12/02/2022   HGBA1C 7.4 (A) 09/02/2021   HGBA1C 7.4 (A) 04/22/2021   GLUCOSE 217 (H) 12/02/2022   MICRALBCREAT 1.4 06/26/2014   CREATININE 1.13 12/02/2022   CREATININE 1.18 04/22/2021   CREATININE 0.95 12/15/2020   GFR 72.54 12/02/2022   GFR 69.65 04/22/2021   GFR 60.21 04/30/2020    Lab Results  Component Value Date   CHOL 138 12/02/2022   LDLCALC 77 12/02/2022   LDLCALC 67 04/22/2021   LDLCALC 59 04/30/2020   LDLDIRECT 90.0 03/14/2018   LDLDIRECT 130.0 03/15/2017   HDL 30.00 (L) 12/02/2022   TRIG 155.0 (H) 12/02/2022   TRIG 100.0 04/22/2021   TRIG 114.0 04/30/2020   ALT 54 (H) 12/02/2022   ALT 26 04/22/2021   AST 32 12/02/2022   AST 20 04/22/2021      Chemistry      Component Value Date/Time   NA 137 12/02/2022 0809   K 4.1 12/02/2022 0809   CL  101 12/02/2022 0809   CO2 29 12/02/2022 0809   BUN 18 12/02/2022 0809   CREATININE 1.13 12/02/2022 0809      Component Value Date/Time   CALCIUM 9.4 12/02/2022 0809   ALKPHOS 64 12/02/2022 0809   AST 32 12/02/2022 0809   ALT 54 (H) 12/02/2022 0809   BILITOT 0.5 12/02/2022 0809     The 10-year ASCVD risk score (Arnett DK, et al., 2019) is: 27.8%  Assessment and Plan:   1. Diabetes, type 2: uncontrolled per last A1c of 10% (12/02/22), increased from previous 7.4% (09/02/21) with goal <7% without hypoglycemia. Hyperglycemia 2/2 in part to medication non-compliance and access barriers. No drug coverage which, hisotorically has remained large barrier to DM therapy optimization. Patient is open to basal/bolus regimen if can obtain for similar cost to current regimen. He also would like to use GLP1 per discussions with PCP. Given significant insulin resistance, consider concentrated basal to improve absorption w high-volume insulin doses.  CGM not reviewed given PCP visit 2 days ago. CGM showed persistently elevated BG upper 200s mg/dL.  HCM: Due for, eye exam, and UACR Continue  medications today without changes.  Start Metfromin XR and glipizide as instructed by PCP   Reviewed s/sx/tx hypoglycemia Based on income/insurance status, hopeful he is eligible for MAP. Assisted patient with online NovoNordisk application. Recommend Novolog and concentrated [place] U200 given high insulin doses. Discussed both Rybelsus and Ozempic. Patient leaning toward Ozempic given once-weekly dosing Likely eligible for LillyCares though they are not accepting new enrollments for Trulicity at this time.  Future Consideration: GLP1-RA: Ideal agent in the setting of significant hyperglycemia A1c>9%.  SGLT2i: Reasonable consideration in the future once closer to euglycemia, especially in the setting of his CKD. If approved for NovoCares, likely eligible for SGLT2i MAP. TZD: Avoiding due to possible weight gain/increase  in fracture risk. Low A1c-lowering propensity compared to alternative agents.    2. Kidney disease: Kidney function stable. eGFR possibly slightly lower than that on labs due to reduced creatinine generation in the setting of reduced muscle mass 2/2 bilateral BKA. Overdue for UACR. Tolerating RAAS agent w/o concerns.  Continue olmesartan 40 mg daily Future Consideration: May consider SGLT2i once A1c has come down. If approved for NovoNordisk MAP, may be eligible for Jardiance/Farxiga MAP program. SGLT2i especially compelling if elevated UACR on repeat labs.   3. HTN: BP elevated based on last clinic BP of 142/78 though possibly related to medication adherence. Patient asked about his goal BP today which we disused is <130/80 mmHg. Reviewed older BP goal of <140/80 less appropriate given his general risk (CKD, HTN, DM, weight). Does not  monitor BP at home though agreeable to start per PCP request. Denies lightheadedness, dizziness, SOB, CP, vision changes.  Continue medications without changes.  Start checking/logging home BP. If elevated, check a second reading a few minutes later to confirm.   4. ASCVD (primary prevention): LDL  slightly above goal  on last lipid panel with LDL 77 mg/dL, TG 562 mg/dL (13/08/65). LDL goal <70 mg/dL (primary prevention, diabetes). Reasonable to anticipate reaching goal LDL via continued dietary changes/exercise. Suspect Tg elevated in the setting of persistent hyperglycemia.  Key risk factors include: diabetes, hypertension, hyperlipidemia, former smoker, and BMI >30 kg/m2 The 10-year ASCVD risk score (Arnett DK, et al., 2019) is: 27.8% indicated patient is at high risk.  Current Regimen: atorvastatin 20 mg daily Continue medications today without changes.    4. Healthcare Maintenance:  Pneumococcal - Current status: Up to date. <65 yo w DM2. Last 2016, 2017.   Shingles - Current status: No record DUE Influenza - Current status: Up to date 2024  Due to receive  the following vaccines: Shingrix and Covid Booster   Follow Up Patient given direct line for questions regarding medication therapy and MAP applications  No future appointments.  Loree Fee, PharmD Clinical Pharmacist [facility] [phone]

## 2022-12-04 NOTE — Patient Instructions (Signed)
Mr. Kenneth Cohen,   It was a pleasure to speak with you today! As we discussed:?   Continue your medications as you have been taking them.  Today we discussed   Please reach out prior to your next scheduled appointment should you have any questions or concerns.  Thank you!   No future appointments.  Loree Fee, PharmD Clinical Pharmacist [facility] [phone]

## 2022-12-14 ENCOUNTER — Encounter: Payer: Self-pay | Admitting: Pharmacist

## 2022-12-14 NOTE — Progress Notes (Signed)
Patient called requesting to speak to someone regarding their Novo application.   Patient received 2 letters in the mail from [employer]. The first letter stated that patient pages have been received though no HCP pages (has since been resolved. Patient pages submitted online whereas provider pages faxed over several days later last week).   The second letter reports that section J has inaccurate information. Reviewed application with patient on the phone who confirms that all of section J information is correct. Section J = Patient name, address, DOB.   Called NovoCares and requested to speak to a representative: Missing information includes: N/A. All information received.  Patient approved. Order is being processed.  Denzil Magnuson, Novolog, NovoFine Needles, Ozempic   Loree Fee, PharmD Clinical Pharmacist [facility] [phone]

## 2022-12-28 ENCOUNTER — Encounter: Payer: Self-pay | Admitting: Pharmacist

## 2022-12-28 NOTE — Progress Notes (Signed)
Patient Was approved for [employer] PAP on 12/14/22. Scheduled for office visit 12/30/22 for insulin transition/injection training.   Upon chart review, appears no shipment has been received at this time.   I was able to get in touch with [employer]. They have confirmed that the order has shipped with a release date of 12/14/22.  The product is guaranteed to be delivered by 01/04/23 at the latest. If the medications are not received by this date, the company states they will offer a voucher for 1 month of medication for free at a retail pharmacy.   Representative confirms that all four requested items are included in the shipment (pen needles, Ozempic, Evaristo Bury, Novolog).  Messaged patient regarding update. Office visit will be rescheduled for a date after medications have arrived.   Loree Fee, PharmD Clinical Pharmacist [facility] [phone]

## 2022-12-30 ENCOUNTER — Ambulatory Visit: Payer: Medicare Other

## 2023-01-05 ENCOUNTER — Telehealth: Payer: Self-pay

## 2023-01-05 ENCOUNTER — Encounter: Payer: Self-pay | Admitting: Pharmacist

## 2023-01-05 ENCOUNTER — Other Ambulatory Visit: Payer: Self-pay | Admitting: Pharmacist

## 2023-01-05 DIAGNOSIS — E118 Type 2 diabetes mellitus with unspecified complications: Secondary | ICD-10-CM

## 2023-01-05 MED ORDER — TRESIBA FLEXTOUCH 200 UNIT/ML ~~LOC~~ SOPN: 110.0000 [IU] | PEN_INJECTOR | Freq: Every day | SUBCUTANEOUS | 0 refills | Status: DC

## 2023-01-05 MED ORDER — RELION MINI PEN NEEDLES 31G X 6 MM MISC: 3 refills | Status: AC

## 2023-01-05 MED ORDER — NOVOLOG FLEXPEN 100 UNIT/ML ~~LOC~~ SOPN: PEN_INJECTOR | SUBCUTANEOUS | 0 refills | Status: DC

## 2023-01-05 MED ORDER — OZEMPIC (0.25 OR 0.5 MG/DOSE) 2 MG/3ML ~~LOC~~ SOPN: 0.2500 mg | PEN_INJECTOR | SUBCUTANEOUS | 0 refills | Status: DC

## 2023-01-05 NOTE — Progress Notes (Unsigned)
01/06/2023 Name: Kenneth Cohen MRN: [id number] DOB: January 13, 1966  Subjective  Chief Complaint  Patient presents with   Diabetes   Reason for visit: ?  Kenneth Cohen is a 57 y.o. male with a history of diabetes (type 2), who presents today for a follow up diabetes pharmacotherapy visit.? Pertinent PMH also includes HTN, HLD, chronic venous insufficiency (2022 exam/noninvasive assessment w no evidence of significant PAD), neuropathy, anxiety.  Known DM Complications: bilateral BK amputation, diabetic kidney disease, neuropathy  Care Team: Primary Care Provider: Doreene Nest, NP   Date of Last Diabetes Related Visit: with PCP on 12/02/22 ; with pharmD 11/15 Recent Summary of Change: 11/15: Application submitted for Novo MAP (insulins/Ozempic) 11/13: Re-establishing care after >1 yr. Off of metformin >1 yr 2/2 GI upset. Start metformin XR, start glipizide 5 mg with breakfast  Medication Access/Adherence: Prescription drug coverage: Payor: MEDICARE / Plan: MEDICARE PART A AND B / Product Type: *No Product type* / .  Has been paying for OTC insulins ~$100/month (Novolog R and N vials)  Current Patient Assistance: Yes Recently approved for [employer] ([place], [place], tresiba U200, NovoFine needle)  History of Present Illness/Since Last Visit: ?  Patient reports long-term use of insulin (>10 years). Historically, cost of prescription insulin products has been a barrier. Has never been on long-acting basal/rapid acting bolus nor insulin pens.   Denies issues since starting metformin ER and glipizide. Received 1-mo free voucher for all Novo medication due to delayed shipment. However, patient received notification 12/17 pm that shipment had arrived to clinic. Therefore, has not taken insulin today in anticipation of starting new insulins.   Reported DM Regimen: ?  Glipizide 5 mg  once daily before breakfast   Metformin XR 1000 mg daily with breakfast  Novolin N 60 units twice  daily (none today) Novolin R 50 units before meals TID (none today)   DM medications tried in the past:?  Metformin IR (stomach trouble)  SMBG: Dexcom G7 (recently learned his phone is compatible with Dexcom. Now using phone)  Hypo/Hyperglycemia: ?  Symptoms of hypoglycemia since last visit:? no  (reports hypoglycemia in the past but only once with significant symptoms).  Reported Diet: Patient typically eats 3 meals per day. Tries to be mindful of eating food prepared at home rather than fast food.  Reports variable diet regarding contents/quantities. Unable to report general idea of a typical B/L/D. Reports his focus has been on protein. Having eggs. Meat is usually chicken. Does have sweet tooth w small snack each day (dislike of sugar-free chocolate).   Exercise: Trying to increase walking/movement throughout the day.   DM Prevention:  Statin: Taking; moderate intensity.?  History of chronic kidney disease? yes History of albuminuria?  No documented history , last UACR on 06/26/14 = 1.4 mg/g ACE/ARB - Taking olmesartan 40 mg daily; Urine MA/CR Ratio -  Due .  Last eye exam: Unclear, overdue Last foot exam: 11/12/2020 (N/A bilateral BKA) Tobacco Use: Former smoker. Vaping occasionally. No cigarettes in 20+ years.   Immunizations:? Flu: Up to date (Last: 12/02/2022); Pneumococcal: PPSV23 (12/2015) PCV13 (09/2014); Shingrix: No Record - DUE; Covid (No record - DUE)  Cardiovascular Risk Reduction History of clinical ASCVD? no (2022 evaluation of diabetic foot infection revealed no c/f significant PAD) History of heart failure? no  History of hyperlipidemia? yes Current BMI: Not calculated, no height s/p BKA (Wt 136.1 kg 12/02/22) Taking statin? yes; moderate intensity (atorvastatin 20 mg) Taking aspirin? not indicated; Not taking  Taking SGLT-2i? no Taking GLP- 1 RA? no (starting Ozempic)   Reported HTN Regimen: ?  Amlodipine 10 mg daily Hydrochlorothiazide 25 mg daily Olmesartan  40 mg daily  Patient is not checking their blood pressure at home regularly though confirms plan to start checking per PCP request. Has BP cuff.   Patient denies hypotensive s/sx. No dizziness, lightheadedness.  Patient denies hypertensive symptoms. No headache, chest pain, shortness of breath, visual changes.      _______________________________________________  Objective    Review of Systems:?  Constitutional:? No fever, chills or unintentional weight loss  Cardiovascular:? No chest pain or pressure, shortness of breath, dyspnea on exertion Pulmonary:? No cough or shortness of breath  GI:? No nausea, vomiting, constipation, diarrhea, abdominal pain, dyspepsia, change in bowel habits    Physical Examination:  Vitals:  Wt Readings from Last 3 Encounters:  12/02/22 300 lb (136.1 kg)  07/14/21 300 lb (136.1 kg)  12/15/20 (!) 326 lb 1 oz (147.9 kg)   BP Readings from Last 3 Encounters:  12/02/22 (!) 142/78  09/02/21 134/68  04/22/21 130/70   Pulse Readings from Last 3 Encounters:  12/02/22 94  09/02/21 (!) 103  04/22/21 95     Labs:?  Lab Results  Component Value Date   HGBA1C 10.0 (A) 12/02/2022   HGBA1C 7.4 (A) 09/02/2021   HGBA1C 7.4 (A) 04/22/2021   GLUCOSE 217 (H) 12/02/2022   MICRALBCREAT 1.4 06/26/2014   CREATININE 1.13 12/02/2022   CREATININE 1.18 04/22/2021   CREATININE 0.95 12/15/2020   GFR 72.54 12/02/2022   GFR 69.65 04/22/2021   GFR 60.21 04/30/2020    Lab Results  Component Value Date   CHOL 138 12/02/2022   LDLCALC 77 12/02/2022   LDLCALC 67 04/22/2021   LDLCALC 59 04/30/2020   LDLDIRECT 90.0 03/14/2018   LDLDIRECT 130.0 03/15/2017   HDL 30.00 (L) 12/02/2022   TRIG 155.0 (H) 12/02/2022   TRIG 100.0 04/22/2021   TRIG 114.0 04/30/2020   ALT 54 (H) 12/02/2022   ALT 26 04/22/2021   AST 32 12/02/2022   AST 20 04/22/2021      Chemistry      Component Value Date/Time   NA 137 12/02/2022 0809   K 4.1 12/02/2022 0809   CL 101 12/02/2022 0809    CO2 29 12/02/2022 0809   BUN 18 12/02/2022 0809   CREATININE 1.13 12/02/2022 0809      Component Value Date/Time   CALCIUM 9.4 12/02/2022 0809   ALKPHOS 64 12/02/2022 0809   AST 32 12/02/2022 0809   ALT 54 (H) 12/02/2022 0809   BILITOT 0.5 12/02/2022 0809     The 10-year ASCVD risk score (Arnett DK, et al., 2019) is: 27.8%  Assessment and Plan:   1. Diabetes, type 2: uncontrolled A1c 10% (12/02/22), increased from previous 7.4% (09/02/21). goal <7% w/o hypoglycemia. Hyperglycemia 2/2 in part to medication non-compliance and access barriers though has been consistent with insulin dosing the past month w minimal change in BG.   - Reviewed CGM report w patient and discussed in detail basal vs bolus insulins (and differences from his previous normal/reg insulin). Reviewed insulin pen use w demo device.  - He is eager to start GLP1. Given persistent hyperglycemia, agree okay to start today. Over the next couple of weeks will closely re-assess BG response with plan to dc glipizide as needed.   HCM: Due for, eye exam, and UACR Stop Novolin N 60 units BID ? Start Tresiba U200 108 units daily (20% reduction roughly  equivalent, opting for 10% reduction given severe hyperglycemia) - patient would like to start at 20% and increase from there Start Tresiba 100 units daily, increase 2 units every 3-4 days if FBG >130 mg/dL. Max 110 units until next follow up.  Stop Novolin R 50 unit TID Start Novolog 50 unit TIDAC. Reviewed importance of skipping if not eating or eating small meal.  Start Ozempic 0.25 mg weekly Reviewed s/sx/tx hypoglycemia and Rule of 15 Future Consideration: Add application for glucagon through [employer] SGLT2i: Reasonable consideration in the future once closer to euglycemia, especially in the setting of his CKD. If approved for [employer]oCares, likely eligible for SGLT2i MAP.    2. Kidney disease: Kidney function stable. eGFR possibly slightly lower than that on labs due to reduced  creatinine generation in the setting of reduced muscle mass 2/2 bilateral BKA. Overdue for UACR. Tolerating RAAS agent w/o concerns.  Continue olmesartan 40 mg daily Future Consideration: May consider SGLT2i once A1c has come down. If approved for [employer]ordisk MAP, may be eligible for Jardiance/Farxiga MAP program. SGLT2i especially compelling if elevated UACR on repeat labs.   3. HTN: BP elevated based on last clinic BP of 142/78 though possibly related to medication adherence. Patient asked about his goal BP today which we disused is <130/80 mmHg. Reviewed older BP goal of <140/80 less appropriate given his general risk (CKD, HTN, DM, weight). Does not  monitor BP at home though agreeable to start per PCP request. Denies lightheadedness, dizziness, SOB, CP, vision changes.  Continue medications without changes.  Start checking/logging home BP. If elevated, check a second reading a few minutes later to confirm.    4. ASCVD (primary prevention): LDL  slightly above goal  on last lipid panel with LDL 77 mg/dL, TG 528 mg/dL (41/32/44). LDL goal <70 mg/dL (primary prevention, diabetes). Reasonable to anticipate reaching goal LDL via continued dietary changes/exercise. Suspect Tg elevated in the setting of persistent hyperglycemia.  Key risk factors include: diabetes, hypertension, hyperlipidemia, former smoker, and BMI >30 kg/m2 The 10-year ASCVD risk score (Arnett DK, et al., 2019) is: 27.8% indicated patient is at high risk.  Current Regimen: atorvastatin 20 mg daily Continue medications today without changes.    4. Healthcare Maintenance:  Pneumococcal - Current status: Up to date. <65 yo w DM2. Last 2016, 2017.   Shingles - Current status: No record DUE Influenza - Current status: Up to date 2024  Due to receive the following vaccines: Shingrix and Covid Booster (shingrix may be available through [employer] MAP program; Not covered by Medicare B)   Follow Up Patient given direct line for questions  regarding medication therapy and MAP applications.  Follow up per Dexcom Clarity 01/11/23. Via phone if need for insulin titration.   Future Appointments  Date Time Provider Department Center  01/29/2023 10:00 AM [facility] PHARMACIST [facility]    Loree Fee, PharmD Clinical Pharmacist [facility] [phone]

## 2023-01-05 NOTE — Telephone Encounter (Signed)
Received 3 shipments of PAP meds. Kenneth Cohen, Novolog, ozempic and needles. There are 2 clear bags in the middle fridge labeled with patients name and box of needles is located under [employer].  Patient has been notified and plans to pick up tomorrow when he comes to appt with pharmacist.

## 2023-01-05 NOTE — Patient Instructions (Incomplete)
Mr. YENG MORATAYA,   It was a pleasure to speak with you today! As we discussed:?   Stop Novolin N ? Start Evaristo Bury U200. Start with 108 units once daily.  Stop Novolin R ?Start Novolog. 50 units three times daily before meals (fifteen minutes before each meal) Start Ozempic 0.25 mg under the skin once weekly  While on a GLP1 medication such as Ozempic: Eating smaller meals, avoiding high-fat and high-sugar foods, and remaining upright after eating may reduce nausea.  Overeating is a major trigger of nausea, as often times patients will start to feel full sooner and may need to decrease portion sizes from what they were previously accustomed to.  Note, that nausea typically resolves over time and is often a temporary side effect as your body adjusts.     How to treat low blood sugar:  - For blood sugar less than 70: Treat with 4 ounces of juice or regular soda, or with 3 to 4 glucose tablets.  - Re-check blood sugar in 15 minutes. ?If blood sugar is still less than 70 on re-check, treat again and re-check in 15 minutes  Novo Nordisk Patient Assistance Foundation [facility]) Medication: Ozempic or Rybelsus. Long acting and rapid acting insulins.  [address] [employer] [address], [place], [place] [address]  Phone: [phone] M-F 8am-8pm ET    Fax: [phone]    Please reach out prior to your next scheduled appointment should you have any questions or concerns.  Thank you!   Future Appointments  Date Time Provider Department Center  01/06/2023  8:15 AM [facility] PHARMACIST [facility]    Loree Fee, PharmD Clinical Pharmacist [facility] [phone]

## 2023-01-05 NOTE — Progress Notes (Signed)
[employer] 01/05/23 regarding no medication received at this time. Medications were guaranteed as of 01/04/23. Company now reports that the medications have not shipped despite last phone conversation.   30-Day voucher supplied for each of the following: Voucher numbers have been included on each prescription.  Novolog U100:  Medication: NovoLog FlexPen (5x24mL) BIN: [id number] PCN: [id number] GRP: [id number] Voucher ID: [id number]   Evaristo Bury U200:  Medication: Evaristo Bury 200U/ml FlexTouch BIN: [id number] PCN: [id number] GRP: [id number] Voucher ID: [id number]   Ozempic 0.25  BIN: [id number] Medication: Ozempic 0.25/.5mg  (1 Pen x 34mL/Pen) PCN: [id number] GRP: [id number] Voucher ID: [id number]  Plan has been to schedule patient in person once Novo order arrived at clinic though he will now pick up at local pharmacy. In-person visit primarily for Dexcom CGM download (uses reader, not smart phone).  Discussed with patient Advised him to continue current medications for today (will not start new insulins until tomorrow) Patient aware the medication instruction will be  Connected patient to [facility] Clarity Scheduled for office visit w me to review insulin pen/Ozempic in greater detail  Patient reports the following insulin dosing since last visit. Denies missed doses:  Novolin N 60 units BID Novolin R 50 units TID

## 2023-01-06 ENCOUNTER — Ambulatory Visit: Payer: Medicare Other

## 2023-01-06 DIAGNOSIS — E118 Type 2 diabetes mellitus with unspecified complications: Secondary | ICD-10-CM | POA: Diagnosis not present

## 2023-01-08 ENCOUNTER — Encounter: Payer: Self-pay | Admitting: Pharmacist

## 2023-01-18 ENCOUNTER — Other Ambulatory Visit (INDEPENDENT_AMBULATORY_CARE_PROVIDER_SITE_OTHER): Payer: Medicare Other | Admitting: Pharmacist

## 2023-01-18 DIAGNOSIS — E118 Type 2 diabetes mellitus with unspecified complications: Secondary | ICD-10-CM

## 2023-01-19 ENCOUNTER — Encounter: Payer: Self-pay | Admitting: Pharmacist

## 2023-01-19 NOTE — Progress Notes (Addendum)
 Adult Nurse Program (MAP) Application   Manufacturer: Novo Nordisk    (Currently enrolled  -  New medication Request) Medication(s): teacher, english as a foreign language ([place])  Novo Dose Change Form:  01/19/23: Provider portion completed by PharmD and faxed to clinic for review and signature. Dropped in provider fax folder.   Application Status: Not submitted (pending signatures)  Next Steps: [x]   (12/31) PCP signature [x]    (12/31) Upon signature(s) Application to be faxed to [employer] Fax: [phone]   Forwarded to [facility] CPhT Patient Advocate Team for future correspondences/re-enrollment.  Note routed to PCP Clinic Pool to ensure PCP signature is obtained and application is faxed.  *[facility] clinic team - Please Addend/update this note as the Next Steps are completed in office*

## 2023-01-25 NOTE — Progress Notes (Signed)
 Faxed to [employer]

## 2023-01-28 DIAGNOSIS — Z1211 Encounter for screening for malignant neoplasm of colon: Secondary | ICD-10-CM | POA: Diagnosis not present

## 2023-01-29 ENCOUNTER — Other Ambulatory Visit (INDEPENDENT_AMBULATORY_CARE_PROVIDER_SITE_OTHER): Payer: Medicare Other | Admitting: Pharmacist

## 2023-01-29 DIAGNOSIS — E118 Type 2 diabetes mellitus with unspecified complications: Secondary | ICD-10-CM

## 2023-01-29 NOTE — Patient Instructions (Addendum)
 Mr. Kenneth Cohen,   It was a pleasure to speak with you today! As we discussed:?   Increase Tresiba  to 122 units each morning  After 3-4 days, if your morning sugars remain consistently above 130 mg/dL, please increase your Tresiba  once more to 126 units daily.   Continue Novolog  45 units up to three times daily before meals (fifteen minutes before each meal) Skip Novolog  if skipping a meal Skip Novolog  if having low/no carb (eg just eating eggs, or only a handful of nuts)  Continue Ozempic  0.25 mg under the skin once weekly for 4 weeks total (RED pen) After 4 weeks, you can increase the dose to 0.5 mg dose once weekly (also the RED pen). This should be around 02/04/23.  While on a GLP1 medication such as Ozempic : Eating smaller meals, avoiding high-fat and high-sugar foods, and remaining upright after eating may reduce nausea.  Overeating is a major trigger of nausea, as often times patients will start to feel full sooner and may need to decrease portion sizes from what they were previously accustomed to.  Note, that nausea typically resolves over time and is often a temporary side effect as your body adjusts.     How to treat low blood sugar:  - For blood sugar less than 70: Treat with 4 ounces of juice or regular soda, or with 3 to 4 glucose tablets.  - Re-check blood sugar in 15 minutes. ?If blood sugar is still less than 70 on re-check, treat again and re-check in 15 minutes  Glucagon (brand name is ZEGALOGUE) I have added a prescription for glucagon to your Novo Nordisk form. Glucogon is the antidote to treat low blood sugar, similar to an Epi pen for people with severe allergies.  - This medicine is for use in emergencies for if you are unconscious/unresponsive due to low blood sugar. This would be administered by a family member or person nearby, ensure family knows where to locate the device if needed.   Novo Nordisk Patient Assistance Foundation [facility]) Medication:  Ozempic  or Rybelsus . Long acting and rapid acting insulins.  [employer] [address], [place], [place] [address]  Phone: [phone] 8am-8pm ET    Fax: [phone]    Please reach out prior to your next scheduled appointment should you have any questions or concerns.  Thank you!    Future Appointments  Date Time Provider Department Center  02/18/2023 10:00 AM [facility] [facility] PHARMACIST [facility]    Kenneth Cohen, PharmD Clinical Pharmacist [facility] [phone]

## 2023-01-29 NOTE — Progress Notes (Signed)
 01/29/2023 Name: Kenneth Cohen MRN: [id number] DOB: 1965-10-03  Brief Note: Insulin  Titration   Subjective  Chief Complaint  Patient presents with   Diabetes   Reason for visit: ?  Kenneth Cohen is a 58 y.o. male with a history of diabetes (type 2).?  PMH: HTN, HLD, chronic venous insufficiency s/p bilateral BKA (2022 exam/noninvasive assessment w no evidence of significant PAD), neuropathy, anxiety.  Known DM Complications: bilateral BK amputation, diabetic kidney disease, neuropathy  Care Team: Primary Care Provider: Gretta Comer POUR, NP   Recent Summary of Change: 12/30: ?? Tresiba  116 unit (10%); ??Novolog  45 unit TIDAC (10%). Novo Med Refill form filled out for addition of glucagon pens on next shipment 12/20: Increase Tresiba  to 106 units daily 12/18: Transition to new insulins + start Ozempic  0.25 mg weekly Novolin  N 60 units BID ? Tresiba  U200 100 units daily and increase 2 units q3-4 days if FBG >130 mg/dL. Max 110 units (low starting dose per pt pref) Novolin  R 50 unit TID ? Novolog  50 unit TIDAC. 11/15: Application submitted for Novo MAP (insulins/Ozempic ) 11/13: Re-establishing care after >1 yr. Off of metformin  >1 yr 2/2 GI upset. Start metformin  XR, start glipizide  5 mg with breakfast  Medication Access/Adherence: Prescription drug coverage: Payor: MEDICARE / Plan: MEDICARE PART A AND B / Product Type: *No Product type* / . No Rx coverage.   Current Patient Assistance: Yes Recently approved for Novo Nordisk (Ozempic , Novolog , tresiba  U200, NovoFine needle)  Reports doing very well on Ozempic  so far. One more 0.25 mg injection, then he plans to increase to 0.5 mg dose. Does note early satiety/appetite suppression.   Reported DM Regimen: ?  Tresiba  U200 116 units daily(uses mornings) Novolog  45 units TID before meals Ozempic  0.25 mg weekly (last 0.25 mg injection due today) Glipizide  5 mg  once daily before breakfast   Metformin  XR 1000 mg daily with  breakfast    SMBG: Dexcom G7 (using phone as reader) CGM Report (Date Range 12/28 - 1/10) ABG 232 mg/dL; GMI 1.1%; TIR 82%, high 45%, very high 38%, low 0%, very low 0%    Hypo/Hyperglycemia: ?  Symptoms of hypoglycemia since last visit:? no   Hx Hypoglycemia: Once, long ago.   Reported Diet: Typically eats 3 meals per day. Tries to be mindful of eating food prepared at home rather than fast food. Reports variable diet regarding contents/quantities. Unable to report general idea of a typical B/L/D. Reports his focus has been on protein. Having eggs. Meat is usually chicken. Does have sweet tooth w small snack each day (dislike of sugar-free chocolate).  Since last visit: Focusing on reducing breads/carbs.   Exercise: Trying to increase walking/movement throughout the day.      _______________________________________________  Objective     Labs:?  Lab Results  Component Value Date   HGBA1C 10.0 (A) 12/02/2022   HGBA1C 7.4 (A) 09/02/2021   HGBA1C 7.4 (A) 04/22/2021   GLUCOSE 217 (H) 12/02/2022   MICRALBCREAT 1.4 06/26/2014   CREATININE 1.13 12/02/2022   CREATININE 1.18 04/22/2021   CREATININE 0.95 12/15/2020   GFR 72.54 12/02/2022   GFR 69.65 04/22/2021   GFR 60.21 04/30/2020     Chemistry      Component Value Date/Time   NA 137 12/02/2022 0809   K 4.1 12/02/2022 0809   CL 101 12/02/2022 0809   CO2 29 12/02/2022 0809   BUN 18 12/02/2022 0809   CREATININE 1.13 12/02/2022 0809      Component Value  Date/Time   CALCIUM  9.4 12/02/2022 0809   ALKPHOS 64 12/02/2022 0809   AST 32 12/02/2022 0809   ALT 54 (H) 12/02/2022 0809   BILITOT 0.5 12/02/2022 0809     The 10-year ASCVD risk score (Arnett DK, et al., 2019) is: 27.8%  Assessment and Plan:   1. Diabetes, type 2: uncontrolled A1c 10% (12/02/22), increased from previous 7.4% (09/02/21). Goal <7% w/o hypoglycemia. Starting to see improvement in glycemic control on Dexcom report w GMI decreased to 9.5% at last visit,  today GMI 8.8%. No c/f hypoglycemia. Titration has been gradual/slow per patient fears of hypoglycemia.  HCM: Due for, eye exam, and UACR Current: Tresiba  116 unit daily, Novolog  45 unit TIDAC, Ozempic  0.25 mg weekly, Glipizide  5 mg once daily before breakfast, Metformin  XR 1000 mg daily with breakfast  Increase Tresiba  U200 116 ? 122 (5%). After 3-4 days, increase by 4 units (126 units daily) for persistent FBG >130 mg/dL.  Continue Ozempic  0.25 mg weekly, due to increase to 0.5 mg on 02/04/23. Reviewed s/sx/tx hypoglycemia and Rule of 15.  Future Consideration: SGLT2i: Reasonable consideration in the future once closer to euglycemia, especially in the setting of his CKD. Discussed stopping glipizide  which patient prefers to keep on at this time. As long as late morning/afternoon sugar remains stable, okay to continue. As Ozempic  is titrated, will re-evaluate discontinuation as appetite lessens.    2. Kidney disease: Kidney function stable. eGFR possibly slightly lower than that on labs due to reduced creatinine generation in the setting of reduced muscle mass 2/2 bilateral BKA. Overdue for UACR. Tolerating RAAS agent w/o concerns.  Continue olmesartan  40 mg daily Future Consideration: Consider SGLT2i once A1c has come down ~9 or below. May be eligible for Jardiance/Farxiga  MAP program. SGLT2i especially compelling if elevated UACR on repeat labs. Consider adding cystatin-C on next labs per bilateral amputee (Code = [id number])  Follow Up:  Brief insulin  titration phone visit ~2 weeks or sooner as needed. Patient has been given pharmacist direct line for any questions/concerns.   Future Appointments  Date Time Provider Department Center  02/18/2023 10:00 AM [facility] [facility]CM PHARMACIST [facility]    [name], PharmD Clinical Pharmacist [facility] [phone]

## 2023-02-03 DIAGNOSIS — R195 Other fecal abnormalities: Secondary | ICD-10-CM

## 2023-02-03 DIAGNOSIS — Z1211 Encounter for screening for malignant neoplasm of colon: Secondary | ICD-10-CM

## 2023-02-03 LAB — COLOGUARD: COLOGUARD: POSITIVE — AB

## 2023-02-16 ENCOUNTER — Other Ambulatory Visit (INDEPENDENT_AMBULATORY_CARE_PROVIDER_SITE_OTHER): Payer: Self-pay | Admitting: Pharmacist

## 2023-02-16 DIAGNOSIS — E118 Type 2 diabetes mellitus with unspecified complications: Secondary | ICD-10-CM

## 2023-02-16 DIAGNOSIS — Z7984 Long term (current) use of oral hypoglycemic drugs: Secondary | ICD-10-CM

## 2023-02-16 DIAGNOSIS — Z7985 Long-term (current) use of injectable non-insulin antidiabetic drugs: Secondary | ICD-10-CM

## 2023-02-16 NOTE — Progress Notes (Signed)
02/16/2023 Name: Kenneth Cohen MRN: [id number] DOB: May 24, 1965  Brief Note: Insulin Titration   Subjective  Chief Complaint  Patient presents with   Diabetes   Reason for visit: ?  Kenneth Cohen is a 58 y.o. male with a history of diabetes (type 2).?  PMH: HTN, HLD, chronic venous insufficiency s/p bilateral BKA (2022 exam/noninvasive assessment w no evidence of significant PAD), neuropathy, anxiety.  Known DM Complications: bilateral BK amputation, diabetic kidney disease, neuropathy  Care Team: Primary Care Provider: Doreene Nest, NP   Recent Summary of Change: 1/10: ?? Tresiba to 122 unit/day; ??Novolog 40 unit before meals 12/30: ?? Tresiba 116 unit (10%); ??Novolog 45 unit TIDAC (10%). [employer] Refill form filled out for addition of desglucagon pens on next shipment 12/20: Increase Tresiba to 106 units daily 12/18: Transition to new insulins + start Ozempic 0.25 mg weekly Novolin N 60 units BID ? Tresiba U200 100 units daily and increase 2 units q3-4 days if FBG >130 mg/dL. Max 110 units (low starting dose per pt pref) Novolin R 50 unit TID ? Novolog 50 unit TIDAC. 11/15: Application submitted for Novo MAP (insulins/Ozempic) 11/13: Re-establishing care after >1 yr. Off of metformin >1 yr 2/2 GI upset. Start metformin XR, start glipizide 5 mg with breakfast  Medication Access/Adherence: Prescription drug coverage: Payor: MEDICARE / Plan: MEDICARE PART A AND B / Product Type: *No Product type* / . No Rx coverage.   Current Patient Assistance: Yes [employer] ([name], [place], tresiba U200, NovoFine needle)   HPI/Since Last Visit: Reports doing very well on Ozempic so far. No concerns or side effect with titration to 0.5 mg dose. Notes his appetite is reduced and he feels he is starting to lose some weight. He is very happy with Ozempic so far. Third 0.5mg  injection due this week.   Reported DM Regimen: ?  Tresiba U200 120 units daily(uses mornings) Novolog 45  units TID before meals (skips if skipping meals) Ozempic 0.5 mg weekly (Thursdays; Increased 02/05/23) Glipizide 5 mg  once daily before breakfast   Metformin XR 1000 mg daily with breakfast    SMBG: Dexcom G7 (using phone as reader) CGM Report (Date Range 1/15 - 1/28) ABG 239 mg/dL; GMI 7.8%; Variability 18%; TIR 8%, high 55%, very high 37%, low 0%, very low 0%      Hypo/Hyperglycemia: ?  Symptoms of hypoglycemia since last visit:? no   Hx Hypoglycemia: Once, long ago.   Reported Diet: Typically eats 2-3 meals per day. Tries to be mindful of eating food prepared at home rather than fast food. Reports variable diet regarding contents/quantities.  Focus has been on protein and portions. Having eggs. Meat is usually chicken. Since last visit: Focusing on reducing breads/carbs and feels he is making continued improvement each week.   Exercise: Trying to increase walking/movement throughout the day.      _______________________________________________  Objective     Labs:?  Lab Results  Component Value Date   HGBA1C 10.0 (A) 12/02/2022   HGBA1C 7.4 (A) 09/02/2021   HGBA1C 7.4 (A) 04/22/2021   GLUCOSE 217 (H) 12/02/2022   MICRALBCREAT 1.4 06/26/2014   CREATININE 1.13 12/02/2022   CREATININE 1.18 04/22/2021   CREATININE 0.95 12/15/2020   GFR 72.54 12/02/2022   GFR 69.65 04/22/2021   GFR 60.21 04/30/2020     Chemistry      Component Value Date/Time   NA 137 12/02/2022 0809   K 4.1 12/02/2022 0809   CL 101 12/02/2022 0809  CO2 29 12/02/2022 0809   BUN 18 12/02/2022 0809   CREATININE 1.13 12/02/2022 0809      Component Value Date/Time   CALCIUM 9.4 12/02/2022 0809   ALKPHOS 64 12/02/2022 0809   AST 32 12/02/2022 0809   ALT 54 (H) 12/02/2022 0809   BILITOT 0.5 12/02/2022 0809     The 10-year ASCVD risk score (Arnett DK, et al., 2019) is: 27.8%  Assessment and Plan:   1. Diabetes, type 2: uncontrolled A1c 10% (12/02/22), increased from previous 7.4% (09/02/21). Goal  <7% w/o hypoglycemia. GMI relatively unchanged over past 3 weeks, though BG appears much more stable (18% variability compared to 21%, 25% previous). No c/f hypoglycemia. Largest BG drop continues to be later afternoon after lunch though still no c/f lows at this time. Continue w 10% basal titration, 10% prandial reduction. Slower titration per patient hesitancy.  HCM: Due for, eye exam, and UACR Current: Tresiba 122 unit daily, Novolog 45 unit TIDAC, Ozempic 0.5 mg weekly, Glipizide 5 mg once daily before breakfast, Metformin XR 1000 mg daily with breakfast  Increase Tresiba U200 122 ? 130 (6.6%). If FBG persistently >130 after a week, increase Decrease Humalog 45 ? 40 unit TIDAC Continue Ozempic 0.5 mg weekly, due to increase to 1.0 mg on 03/04/23. Reviewed s/sx/tx hypoglycemia and Rule of 15.   Future Consideration: MTF: Would benefit from increasing metformin as to encourage less need for insulin titration -- though consider titration after confirming renal function w cystatin-c. SGLT2i: Reasonable consideration in the future once closer to euglycemia. SU: Discussed stopping glipizide which patient prefers to keep on at this time. As long as late morning/afternoon sugar remains stable, okay to continue and focus on Humalog reduction. As Ozempic is titrated, will re-evaluate discontinuation as appetite lessens.  Insulin: Will likely need quite a bit more basal insulin for control (though ideally can minimize this w titration of Ozempic/metformin/SGLT2i). As he continues to lose weight on Ozempic, may also see reduction in insulin requirement over time.    2. Kidney disease: Kidney function stable. eGFR possibly slightly lower than that on labs due to reduced creatinine generation in the setting of reduced muscle mass 2/2 bilateral amputation (though both are BKA). Overdue for UACR. Tolerating RAAS agent w/o concerns.  Continue olmesartan 40 mg daily Future Consideration: Consider SGLT2i once A1c  has come down to ~9% or below. May be eligible for Jardiance/Farxiga MAP program. SGLT2i especially compelling if elevated UACR on repeat labs. Consider one-time addition of cystatin-C on next labs (Code = [id number])  Follow Up:  Brief insulin titration f/u visit via phone 3-4 weeks  Patient has been given pharmacist direct line for any questions/concerns.   Future Appointments  Date Time Provider Department Center  03/11/2023  2:00 PM [facility] PHARMACIST [facility]    Loree Fee, PharmD Clinical Pharmacist [facility] [phone]

## 2023-02-16 NOTE — Patient Instructions (Signed)
Mr. Kenneth Cohen,   It was a pleasure to speak with you today! As we discussed:?   Increase Tresiba to 130 units each morning  After 1 week, if your morning sugars remain consistently above 130 mg/dL, please increase your Evaristo Bury once more to 135 units daily.   Decrease Novolog to 40 units up to three times daily before meals (fifteen minutes before each meal) Skip Novolog if skipping a meal Skip Novolog if having low/no carb (eg just eating eggs, or only a handful of nuts)  Continue Ozempic 0.5 mg under the skin once weekly for 4 weeks total After 4 weeks, you can increase the dose to 1.0 mg dose once weekly. This should be around 03/04/23 We discussed you may continue 0.5 mg for 2 extra weeks to use up your red pen, OR you may inject 0.5 mg twice on 2/13 to equal 1.0 mg dose   While on a GLP1 medication such as Ozempic: Eating smaller meals, avoiding high-fat and high-sugar foods, and remaining upright after eating may reduce nausea.  Overeating is a major trigger of nausea, as often times patients will start to feel full sooner and may need to decrease portion sizes from what they were previously accustomed to.  Note, that nausea typically resolves over time and is often a temporary side effect as your body adjusts.     How to treat low blood sugar:  - For blood sugar less than 70: Treat with 4 ounces of juice or regular soda, or with 3 to 4 glucose tablets.  - Re-check blood sugar in 15 minutes. ?If blood sugar is still less than 70 on re-check, treat again and re-check in 15 minutes  Glucagon (brand name is ZEGALOGUE) Last visit, I added a prescription for glucagon to your [employer] form. Glucogon is the "antidote" to treat low blood sugar, similar to an Epi pen for people with severe allergies.  - This medicine is for use in emergencies for if you are unconscious/unresponsive due to low blood sugar. This would be administered by a family member or person nearby, ensure family  knows where to locate the device if needed.   [employer] Patient [employer] ([employer]) Medication: Ozempic or Rybelsus. Long acting and rapid acting insulins.  [employer] [address], [place], [place] [address]  Phone: [phone] 8am-8pm ET    Fax: [phone]    Please reach out prior to your next scheduled appointment should you have any questions or concerns.  Thank you!    Future Appointments  Date Time Provider Department Center  03/11/2023  2:00 PM [facility] PHARMACIST [facility]    Loree Fee, PharmD Clinical Pharmacist [facility] [phone]

## 2023-02-18 ENCOUNTER — Other Ambulatory Visit: Payer: Medicare Other

## 2023-03-02 ENCOUNTER — Other Ambulatory Visit (INDEPENDENT_AMBULATORY_CARE_PROVIDER_SITE_OTHER): Payer: Medicare Other | Admitting: Pharmacist

## 2023-03-02 DIAGNOSIS — Z7984 Long term (current) use of oral hypoglycemic drugs: Secondary | ICD-10-CM

## 2023-03-02 DIAGNOSIS — Z794 Long term (current) use of insulin: Secondary | ICD-10-CM

## 2023-03-02 DIAGNOSIS — Z7985 Long-term (current) use of injectable non-insulin antidiabetic drugs: Secondary | ICD-10-CM

## 2023-03-02 DIAGNOSIS — E118 Type 2 diabetes mellitus with unspecified complications: Secondary | ICD-10-CM

## 2023-03-02 NOTE — Progress Notes (Signed)
03/02/2023 Name: Kenneth Cohen MRN: [id number] DOB: [birth year]  Brief Note: Insulin Titration   Subjective  Chief Complaint  Patient presents with   Diabetes   Reason for visit: ?  Kenneth Cohen is a 58 y.o. male with a history of diabetes (type 2).?  PMH: HTN, HLD, chronic venous insufficiency s/p bilateral BKA (2022 exam/noninvasive assessment w no evidence of significant PAD), neuropathy, anxiety.  Known DM Complications: diabetic kidney disease, neuropathy  Care Team: Primary Care Provider: Doreene Nest, NP   Recent Summary of Change: 1/10: ?? Tresiba to 122 unit/day; ??Novolog 40 unit before meals 12/30: ?? Tresiba 116 unit (10%); ??Novolog 45 unit TIDAC (10%). Novo Med Refill form filled out for addition of desglucagon pens on next shipment 12/20: Increase Tresiba to 106 units daily 12/18: Transition to new insulins + start Ozempic 0.25 mg weekly Novolin N 60 units BID ? Tresiba U200 100 units daily and increase 2 units q3-4 days if FBG >130 mg/dL. Max 110 units (low starting dose per pt pref) Novolin R 50 unit TID ? Novolog 50 unit TIDAC. 11/15: Application submitted for Novo MAP (insulins/Ozempic) 11/13: Re-establishing care after >1 yr. Off of metformin >1 yr 2/2 GI upset. Start metformin XR, start glipizide 5 mg with breakfast   Reported DM Regimen: ?  Tresiba U200 135 units qam Novolog 40 units TID before meals (skips if skipping meals) Ozempic 0.5 mg weekly Glipizide 5 mg  once daily before breakfast  (has been holding) Metformin XR 1000 mg daily with breakfast (takes on empty stomach before breakfast)   SMBG: Dexcom G7 (using phone as reader)     CGM Report (Date Range 1/15 - 1/28) ABG 239 mg/dL; GMI 4.2%; Variability 18%; TIR 8%, high 55%, very high 37%, low 0%, very low 0%  TODAY  LAST VISIT    Hypo/Hyperglycemia: ?  Symptoms of hypoglycemia since last visit:? no   Hx Hypoglycemia: Once, long ago.   Reported Diet: Typically eats 3 meals  per day. Tries to be mindful of eating food prepared at home rather than fast food. Reports variable diet regarding contents/quantities.  Focus has been on protein and portions. Having eggs. Meat is usually chicken. Since last visit: Focusing on reducing breads/carbs and feels he is making continued improvement each week. Feels he still has some progress to go with his diet.   Exercise: Trying to increase walking/movement throughout the day. Works out almost every day now.      _______________________________________________  Objective     Labs:?  Lab Results  Component Value Date   HGBA1C 10.0 (A) 12/02/2022   HGBA1C 7.4 (A) 09/02/2021   HGBA1C 7.4 (A) 04/22/2021   GLUCOSE 217 (H) 12/02/2022   MICRALBCREAT 1.4 06/26/2014   CREATININE 1.13 12/02/2022   CREATININE 1.18 04/22/2021   CREATININE 0.95 12/15/2020   GFR 72.54 12/02/2022   GFR 69.65 04/22/2021   GFR 60.21 04/30/2020     Chemistry      Component Value Date/Time   NA 137 12/02/2022 0809   K 4.1 12/02/2022 0809   CL 101 12/02/2022 0809   CO2 29 12/02/2022 0809   BUN 18 12/02/2022 0809   CREATININE 1.13 12/02/2022 0809      Component Value Date/Time   CALCIUM 9.4 12/02/2022 0809   ALKPHOS 64 12/02/2022 0809   AST 32 12/02/2022 0809   ALT 54 (H) 12/02/2022 0809   BILITOT 0.5 12/02/2022 0809     The 10-year ASCVD risk score (Arnett DK, et  al., 2019) is: 27.8%  Assessment and Plan:   1. Diabetes, type 2: uncontrolled A1c 10% (12/02/22), increased from previous 7.4% (09/02/21). Goal <7% w/o hypoglycemia. GMI initially improved though has remained fixed over the last couple of weeks. The past 2 weeks primarily, sugars have begun to increase again. Denies changes to diet, tries to exercise almost daily. He does note today that he has been off of glipizide. Ozempic titration due. Does not want to make more than 1 medication change at a time - agreeable to resuming glipizide and following up at end of week to discuss changes  below:   Current: Tresiba 135 unit daily, Novolog 40 unit TIDAC, Ozempic 0.5 mg weekly, Glipizide 5 mg once daily before breakfast, Metformin XR 1000 mg daily with breakfast  2/12: Resume glipizide XL 5 mg qam 2/20: Increase Ozempic to 1.0 mg Reviewed s/sx/tx hypoglycemia and Rule of 15.   Future Consideration: MTF: Would benefit from increasing metformin as to encourage less need for insulin titration. Patient agreeable to increase -- will consider titration after confirming renal function w cystatin-c. SGLT2i: Reasonable consideration in the future once closer to euglycemia.  Insulin: We discussed need to be a bit more aggressive now that we have seen his response to incremental titrations (minimal). basal insulin for control (though ideally can minimize this w titration of Ozempic/metformin/SGLT2i). As he continues to lose weight on Ozempic, may also see reduction in insulin requirement over time.    2. Kidney disease: Kidney function stable. eGFR possibly slightly lower than that on labs due to reduced creatinine generation in the setting of reduced muscle mass 2/2 bilateral amputation (though both are BKA). Overdue for UACR. Tolerating RAAS agent w/o concerns.  Continue olmesartan 40 mg daily Future Consideration: Consider SGLT2i once A1c has come down to ~9% or below. May be eligible for Jardiance/Farxiga MAP program. SGLT2i especially compelling if elevated UACR on repeat labs. Consider one-time addition of cystatin-C on next labs (Code = [id number])  Follow Up:  Brief insulin titration f/u visit via phone 3-4 weeks  Patient has been given pharmacist direct line for any questions/concerns.   Future Appointments  Date Time Provider Department Center  03/11/2023  2:00 PM [facility] PHARMACIST [facility]    Loree Fee, PharmD Clinical Pharmacist [facility] [phone]

## 2023-03-02 NOTE — Patient Instructions (Addendum)
Mr. EDRIS FRIEDT,   It was a pleasure to speak with you today! As we discussed:?   Continue metformin XR 1000 mg.  Focus on taking with food as this with increase the absorption of the medicine.  We discussed option to increase metformin to the goal dose of 2000 mg daily (or 1000 mg twice daily)  Continue/resume glipizide 5 mg in the morning before first meal If you are seeing low sugars in the afternoon as you were before, we will discuss reducing your Humalog dose  Continue Tresiba 135 units each morning We discussed option to increase at the end of the week once you are back on glipizide for a few days   Continue Novolog 40 units up to three times daily before meals (fifteen minutes before each meal) Skip Novolog if skipping a meal Skip Novolog if having low/no carb (eg just eating eggs, or only a handful of nuts)  Continue Ozempic 0.5 mg under the skin once weekly for 4 weeks total THEN increase to the 1.0 mg dose once weekly   While on a GLP1 medication such as Ozempic: Eating smaller meals, avoiding high-fat and high-sugar foods, and remaining upright after eating may reduce nausea.  Overeating is a major trigger of nausea, as often times patients will start to feel full sooner and may need to decrease portion sizes from what they were previously accustomed to.  Note, that nausea typically resolves over time and is often a temporary side effect as your body adjusts.     How to treat low blood sugar:  - For blood sugar less than 70: Treat with 4 ounces of juice or regular soda, or with 3 to 4 glucose tablets.  - Re-check blood sugar in 15 minutes. ?If blood sugar is still less than 70 on re-check, treat again and re-check in 15 minutes  Glucagon (brand name is ZEGALOGUE) Last visit, I added a prescription for glucagon to your [employer] form. Glucogon is the "antidote" to treat low blood sugar, similar to an Epi pen for people with severe allergies.  - This medicine is for  use in emergencies for if you are unconscious/unresponsive due to low blood sugar. This would be administered by a family member or person nearby, ensure family knows where to locate the device if needed.   [employer] Patient Assistance Foundation ([employer]ovo[employer]) Medication: Ozempic or Rybelsus. Long acting and rapid acting insulins.  [employer] [address], [place], [place] [address]  Phone: [phone]-F 8am-8pm ET    Fax: [phone]    Please reach out prior to your next scheduled appointment should you have any questions or concerns.  Thank you!    Future Appointments  Date Time Provider Department Center  03/11/2023  2:00 PM [facility] PHARMACIST [facility]    Loree Fee, PharmD Clinical Pharmacist [facility] [phone]

## 2023-03-04 ENCOUNTER — Other Ambulatory Visit: Payer: Self-pay | Admitting: Primary Care

## 2023-03-04 DIAGNOSIS — E118 Type 2 diabetes mellitus with unspecified complications: Secondary | ICD-10-CM

## 2023-03-05 NOTE — Telephone Encounter (Signed)
Called and spoke with patient advised he is sue for DM f/u with A1C check, he verbalized understanding. Needed to check with wifes schedule and stated he would call back to schedule

## 2023-03-05 NOTE — Telephone Encounter (Signed)
Kenneth Cohen, please contact patient. He is due for diabetes follow up with A1C check. Can we get him scheduled?  [name], fyi if you happen to be speaking with him.

## 2023-03-11 ENCOUNTER — Other Ambulatory Visit: Payer: Medicare Other | Admitting: Pharmacist

## 2023-03-11 DIAGNOSIS — E118 Type 2 diabetes mellitus with unspecified complications: Secondary | ICD-10-CM

## 2023-03-11 NOTE — Progress Notes (Unsigned)
 03/12/2023 Name: Kenneth Cohen MRN: [id number] DOB: [birth year]  Brief Note: Insulin Titration   Subjective  Chief Complaint  Patient presents with   Diabetes   Reason for visit: ?  Kenneth Cohen is a 58 y.o. male with a history of diabetes (type 2).?  PMH: HTN, HLD, chronic venous insufficiency s/p bilateral BKA (2022 exam/noninvasive assessment w no evidence of significant PAD), neuropathy, anxiety.  Known DM Complications: diabetic kidney disease, neuropathy  Care Team: Primary Care Provider: Doreene Nest, NP  Recent Summary of Change: 2/20: Ozempic titration to 1.0 mg weekly 1/28:?? Tresiba 130 (6.6% per pt pref) unit/day; s/p 1 week if FBG >140, ??136 unit/d 1/10: ?? Tresiba to 122 unit/day; ??Novolog 40 unit before meals 12/30: ?? Tresiba 116 unit (10%); ??Novolog 45 unit TIDAC (10%). [facility] Refill form filled out for addition of desglucagon pens on next shipment 12/20: Increase Tresiba to 106 units daily 12/18: Transition to new insulins + start Ozempic 0.25 mg weekly Novolin N 60 units BID ? Tresiba U200 100 units daily and increase 2 units q3-4 days if FBG >130 mg/dL. Max 110 units (low starting dose per pt pref) Novolin R 50 unit TID ? Novolog 50 unit TIDAC. 11/15: Application submitted for Novo MAP (insulins/Ozempic) 11/13: Re-establishing care after >1 yr. Off of metformin >1 yr 2/2 GI upset. Start metformin XR, start glipizide 5 mg with breakfast   Reported DM Regimen: ?  Tresiba U200 136 units qam Novolog 40 units TID before meals (skips if skipping meals) Ozempic 1.0 mg weekly (2nd dose this week) Glipizide 5 mg  once daily before breakfast Metformin XR 1000 mg daily with breakfast   SMBG: Dexcom G7 (using phone as reader) - Placed last sensor recently (will p/u refil in a week or 2 - reports budget is tighter this week)  CGM Cost: $130/90 days   CGM Report (Date Range 2/7 - 2/20) ABG 250 mg/dL; GMI 5.6%; Variability 18.5%; TIR 8%, high 42%, very  high 50%, low 0%, very low 0%      Hypo/Hyperglycemia: ?  Symptoms of hypoglycemia since last visit:? no   Hx Hypoglycemia: Once, long ago. Feels he can recognize s/sx.   Reported Diet: Typically eats 3 meals per day. Tries to be mindful of eating food prepared at home rather than fast food. Reports variable diet regarding contents/quantities.  Focus has been on protein and portions. Having eggs. Meat is usually chicken. Since last visit: Focusing on reducing breads/carbs and feels he is making continued improvement each week. Feels he still has some progress to go with his diet.   Exercise: Trying to increase movement throughout the day. Works out almost every day now.      _______________________________________________  Objective     Labs:?  Lab Results  Component Value Date   HGBA1C 10.0 (A) 12/02/2022   HGBA1C 7.4 (A) 09/02/2021   HGBA1C 7.4 (A) 04/22/2021   GLUCOSE 217 (H) 12/02/2022   MICRALBCREAT 1.4 06/26/2014   CREATININE 1.13 12/02/2022   CREATININE 1.18 04/22/2021   CREATININE 0.95 12/15/2020   GFR 72.54 12/02/2022   GFR 69.65 04/22/2021   GFR 60.21 04/30/2020     Chemistry      Component Value Date/Time   NA 137 12/02/2022 0809   K 4.1 12/02/2022 0809   CL 101 12/02/2022 0809   CO2 29 12/02/2022 0809   BUN 18 12/02/2022 0809   CREATININE 1.13 12/02/2022 0809      Component Value Date/Time  CALCIUM 9.4 12/02/2022 0809   ALKPHOS 64 12/02/2022 0809   AST 32 12/02/2022 0809   ALT 54 (H) 12/02/2022 0809   BILITOT 0.5 12/02/2022 0809     The 10-year ASCVD risk score (Arnett DK, et al., 2019) is: 29.3%  Assessment and Plan:   1. Diabetes, type 2: uncontrolled A1c 10% (12/02/22), increased from previous 7.4% (09/02/21). Goal <7% w/o hypoglycemia. GMI initially improved though has remained fixed over the last couple of weeks. Patient had been open to but hesitant to medication changes. At this point, he is more comfortable with medication titration and being  a bit more aggressive/realistic with insulin dose increases. We also discussed options of metformin titration or glipizide titration w Novolog reduction (less preferred but reasonable).   Does not want to make more than 1 medication change at a time. Continues to have strong preference for insulin titration over metformin increase (due to seeing some things in the media). Perceived weight loss with Ozempic despite insulin titrations (pants fitting loser)   Current: Tresiba 136 unit daily, Novolog 40 unit TIDAC, Ozempic 1.0 mg weekly, Glipizide 5 mg once daily before breakfast, Metformin XR 1000 mg daily with breakfast  Continue Ozempic 1.0 mg Novo refill form initiated for 2.0 mg weekly maintenance dose for next shipment 10% Basal increase ? 150 units once daily 5-7 days: For FBG >>140 persistently, increase to 160 units No c/f patterns of overbasalization on CGM report at this time. Do suspect very high insulin resistance given stubbornness of glycemic control.  Continue Novolog 40 units TIDAC  Future Consideration: MTF: Would benefit from increasing metformin as to encourage less need for insulin titration. Patient agreeable to increase at follow up if sugars remain elevated -- consider confirming renal function w cystatin-c at next lab visit in the setting of BL BKA/obesity. Glipizide: Reasonable to increase glipizide dose to 10 mg daily (or 5 BID) with a reduction in Novolog dose at breakfast/lunch Tresiba: We discussed need to be a bit more aggressive now that we have seen his response to incremental titrations (minimal). basal insulin for control (though ideally can minimize this w titration of Ozempic/metformin/SGLT2i). Patient continues to state preference for insulin titration over changing oral medications. As he continues to lose weight on Ozempic, may also see reduction in insulin requirement over time.  Novolog: Inconsistency of PP sugars day to day (per daily graphs above). Correctional  scale likely beneficial once closer to euglycemia if c/f dips after eating on isolated days. Would also benefit evening meal hyperglycemia which has been inconsistent  SGLT2i: Reasonable consideration in the future once closer to euglycemia. Amputation risk dispelled.    2. Kidney disease: Kidney function stable. eGFR possibly slightly lower than that on labs due to reduced creatinine generation in the setting of reduced muscle mass 2/2 bilateral amputation (though both are BKA). Overdue for UACR. Tolerating RAAS agent w/o concerns.  Continue olmesartan 40 mg daily Future Consideration: Consider SGLT2i once A1c has come down to ~9% or below. May be eligible for Jardiance/Farxiga MAP program. SGLT2i especially compelling if elevated UACR on repeat labs. Consider one-time addition of cystatin-C on next labs (Code = [id number])  Follow Up:  Brief insulin titration f/u 1-2 weeks Patient has been given pharmacist direct line for any questions/concerns.   No future appointments.   Loree Fee, PharmD Clinical Pharmacist [facility] [phone]

## 2023-03-12 NOTE — Patient Instructions (Addendum)
 Mr. DMAURI ROSENOW,   It was a pleasure to speak with you today! As we discussed:?   Continue metformin XR 1000 mg.  Focus on taking with food as this with increase the absorption of the medicine.  We discussed option to increase metformin to the goal dose of 2000 mg daily (or 1000 mg twice daily) at some point  Continue glipizide 5 mg in the morning before first meal If you are seeing low sugars in the afternoon as you were before, we will discuss reducing your Novolog (mealtime insulin) dose  2/21: Increase Tresiba to 150 units each morning (this is a 10% increase which is still a conservative increase) 2/26: If your fasting morning sugars before food remain above 140 every day, increase your Tresiba from 150 to 160 units.  I have a reminder set this day to peak in at your Dexcom numbers   Continue Novolog 40 units up to three times daily before meals (fifteen minutes before each meal) Skip Novolog if skipping a meal Skip Novolog if having low/no carb (eg just eating eggs, or only a handful of nuts)  Continue Ozempic 1.0 mg under the skin once weekly until gone For your next shipment, we will submit a form for your 2.0 mg maintenance dose    __________________________________________________________ While on a GLP1 medication such as Ozempic: Eating smaller meals, avoiding high-fat and high-sugar foods, and remaining upright after eating may reduce nausea.  Overeating is a major trigger of nausea, as often times patients will start to feel full sooner and may need to decrease portion sizes from what they were previously accustomed to.  Note, that nausea typically resolves over time and is often a temporary side effect as your body adjusts.     How to treat low blood sugar:  - For blood sugar less than 70: Treat with 4 ounces of juice or regular soda, or with 3 to 4 glucose tablets.  - Re-check blood sugar in 15 minutes. ?If blood sugar is still less than 70 on re-check, treat again  and re-check in 15 minutes  Glucagon (brand name is ZEGALOGUE) I added a prescription for glucagon to your [employer] form. Glucogon is the "antidote" to treat low blood sugar, similar to an Epi pen for people with severe allergies.  - This medicine is for use in emergencies if you are unconscious/unresponsive due to low blood sugar. This would be administered by a family member or person nearby - ensure family knows where to locate the device if needed.    [employer] ([employer]) Medication: Ozempic or Rybelsus. Long acting and rapid acting insulins.  [employer] [address], [place], [place] [address]  Phone: [id number]-F 8am-8pm ET    Fax: [phone]    Please reach out prior to your next scheduled appointment should you have any questions or concerns.  Thank you!   No future appointments.   Loree Fee, PharmD Clinical Pharmacist [facility] [phone]

## 2023-04-05 ENCOUNTER — Other Ambulatory Visit (INDEPENDENT_AMBULATORY_CARE_PROVIDER_SITE_OTHER): Payer: Self-pay | Admitting: Pharmacist

## 2023-04-05 DIAGNOSIS — E118 Type 2 diabetes mellitus with unspecified complications: Secondary | ICD-10-CM

## 2023-04-05 NOTE — Patient Instructions (Signed)
 Mr. Kenneth Cohen,   It was a pleasure to speak with you today! As we discussed:?   Continue metformin XR 1000 mg.  Focus on taking with food as this with increase the absorption of the medicine.  We discussed option to increase metformin to the goal dose of 2000 mg daily (or 1000 mg twice daily) at some point  Continue glipizide 5 mg in the morning before first meal If you are seeing low sugars in the afternoon as you were before, we will discuss reducing your Novolog (mealtime insulin) dose  Continue Tresiba 150 units each morning    Continue Novolog 40 units up to three times daily before meals (fifteen minutes before each meal) Skip Novolog if skipping a meal Skip Novolog if having low/no carb (eg just eating eggs, or only a handful of nuts)  Continue Ozempic 1.0 mg under the skin once weekly until gone For your next shipment, we have submitted a form for the 2.0 mg maintenance dose  __________________________________________________________    How to treat low blood sugar:  - For blood sugar less than 70: Treat with 4 ounces of juice or regular soda, or with 3 to 4 glucose tablets.  - Re-check blood sugar in 15 minutes. ?If blood sugar is still less than 70 on re-check, treat again and re-check in 15 minutes  [employer] Patient Assistance Foundation [facility]) Medication: Ozempic or Rybelsus. Long acting and rapid acting insulins.  [employer] [address], [place], [place] [address]  Phone: [id number]-F 8am-8pm ET    Fax: [phone]    Please reach out prior to your next scheduled appointment should you have any questions or concerns.  Thank you!   Future Appointments  Date Time Provider Department Center  04/06/2023  9:40 AM Doreene Nest, NP [facility]  05/05/2023 10:30 AM [facility] PHARMACIST [facility]     Loree Fee, PharmD Clinical Pharmacist [facility] [phone]

## 2023-04-05 NOTE — Progress Notes (Signed)
 04/05/2023 Name: Kenneth Cohen MRN: [id number] DOB: 1965/11/28  Brief Note: Insulin Titration   Subjective  Chief Complaint  Patient presents with   Diabetes   Reason for visit: ?  Kenneth Cohen is a 58 y.o. male with a history of diabetes (type 2).?  PMH: HTN, HLD, chronic venous insufficiency s/p bilateral BKA (2022 exam/noninvasive assessment w no evidence of significant PAD), neuropathy, anxiety.  Known DM Complications: diabetic kidney disease, neuropathy  Care Team: Primary Care Provider: Doreene Nest, NP  Recent Summary of Change: 2/21: ??Tresiba 150 u/d (10% increase). If FBG remain >130-140 after 1 week, ??Tresiba 160 u/d 2/20: Ozempic titration to 1.0 mg weekly 1/28:?? Tresiba 130 (6.6% per pt pref) unit/day; s/p 1 week if FBG >140, ??136 unit/d 1/10: ?? Tresiba to 122 unit/day; ??Novolog 40 unit before meals 12/30: ?? Tresiba 116 unit (10%); ??Novolog 45 unit TIDAC (10%). [facility] Med Refill form filled out for addition of desglucagon pens on next shipment 12/20: Increase Tresiba to 106 units daily 12/18: Transition to new insulins + start Ozempic 0.25 mg weekly Novolin N 60 units BID ? Tresiba U200 100 units daily and increase 2 units q3-4 days if FBG >130 mg/dL. Max 110 units (low starting dose per pt pref) Novolin R 50 unit TID ? Novolog 50 unit TIDAC. 11/15: Application submitted for Novo MAP (insulins/Ozempic) 11/13: Re-establishing care after >1 yr. Off of metformin >1 yr 2/2 GI upset. Start metformin XR, start glipizide 5 mg with breakfast   HPI Patient reports 1 box of Ozempic remaining at home (1.0 mg dose). Has not received refill shipment from Novo yet. Reports significant improvement in eating habits due to goal to lose weight. Has especially reduced sugar intake. Some appetite reduction on Ozempic, though reports strong mental drive/motivation for improvement of his health.   Reports he has been taking supplement (Shilajit gummies) daily. Feels has  more energy.    Reported DM Regimen: ?  Tresiba U200 150 units qam Novolog 40 units TID before meals Ozempic 1.0 mg weekly Glipizide 5 mg  once daily before breakfast Metformin XR 1000 mg daily with breakfast   SMBG: Dexcom G7 (using phone as reader) CGM Cost: $130/90 days   CGM Report (Date Range 3/4 - 3/17) ABG 144 mg/dL; GMI 4.4%; Variability 23%; TIR 87%, high 12%, very high 1%, low 0%, very low 0%      Hypo/Hyperglycemia: ?  Symptoms of hypoglycemia since last visit:? no   Hx Hypoglycemia: Once, long ago. Feels he can recognize s/sx. No longer experiencing relative hypoglycemia in the 100s mg/dL. Symptoms begin around ~80 mg/dL or so.   Reported Diet: Typically eats 3 meals per day. Tries to be mindful of eating food prepared at home rather than fast food. Significantly cut back amount he is eating. Drastic reduction in sugar intake/sweets.  Focus has been on protein and portions. Having eggs. Meat is usually chicken.   Exercise: Trying to increase movement throughout the day. Works out almost every day now.     _______________________________________________  Objective     Labs:?  Lab Results  Component Value Date   HGBA1C 10.0 (A) 12/02/2022   HGBA1C 7.4 (A) 09/02/2021   HGBA1C 7.4 (A) 04/22/2021   GLUCOSE 217 (H) 12/02/2022   MICRALBCREAT 1.4 06/26/2014   CREATININE 1.13 12/02/2022   CREATININE 1.18 04/22/2021   CREATININE 0.95 12/15/2020   GFR 72.54 12/02/2022   GFR 69.65 04/22/2021   GFR 60.21 04/30/2020     Chemistry  Component Value Date/Time   NA 137 12/02/2022 0809   K 4.1 12/02/2022 0809   CL 101 12/02/2022 0809   CO2 29 12/02/2022 0809   BUN 18 12/02/2022 0809   CREATININE 1.13 12/02/2022 0809      Component Value Date/Time   CALCIUM 9.4 12/02/2022 0809   ALKPHOS 64 12/02/2022 0809   AST 32 12/02/2022 0809   ALT 54 (H) 12/02/2022 0809   BILITOT 0.5 12/02/2022 0809     The 10-year ASCVD risk score (Arnett DK, et al., 2019) is:  29.3%  Assessment and Plan:   1. Diabetes, type 2: controlled with significant improvement based on Dexcom GMI today, 6.7%. Due for A1c, expect A1c to remain elevated given several months with persistent hyperglycemia. Goal <7% w/o hypoglycemia. Basal:Prandial ratio >1:1 though No c/f patterns of overbasalization on CGM report at this time. Do suspect very high insulin resistance given stubbornness of glycemic control with previous titrations. Ideally, plan to continue optimization of non-insulin therapies with goal of reducing insulin requirement. Perceived weight loss with Ozempic despite insulin titrations (pants fitting loser), would like to continue titration to 2.0 mg on next refill.   Current Medications: Tresiba 150 unit daily, Novolog 40 unit TIDAC, Ozempic 1.0 mg weekly, Glipizide 5 mg once daily before breakfast, Metformin XR 1000 mg daily with breakfast  DUE: UACR, A1c Continue Ozempic 1.0 mg. Plan for Increase to 2.0 mg upon receipt of Novo shipment Continue Tresiba 150 units/day  -  [employer] form completed to reflect higher maintenance dose  Continue Novolog 40 units TIDAC If any concern for lows following meals, reduce Novolog dose 10% prior to that meal (36 units) Future Consideration: SGLT2i: Reasonable consideration given likely eligible for PAP. Have deferred in the setting of uncontrolled/persistent hyperglycemia though at this time is very well controlled. MTF: Would benefit from increasing metformin as to encourage reduced insulin requirement. Patient agreeable to increase at follow up if sugars remain elevated -- consider confirming renal function w a one-time cystatin-c at next lab visit in the setting of BL BKA/obesity. Glipizide: Reasonable to increase glipizide dose to 10 mg daily (or 5 BID) with a reduction in Novolog dose at breakfast/lunch. Good response to glipizide historically.    2. Kidney disease: stable. eGFR stable, no recent DKD screening. Last UACR  almost 10 years ago. eGFR possibly slightly lower than that on labs due to reduced creatinine generation in the setting of reduced muscle mass 2/2 bilateral amputation (though both are BKA). Tolerating RAAS agent w/o concerns.  Continue olmesartan 40 mg daily Future Consideration: Consider SGLT2i if UACR elevated. May be eligible for Jardiance/Farxiga MAP program. SGLT2i especially compelling if elevated UACR on repeat labs. Consider one-time addition of cystatin-C on next labs (Code = [id number]) DUE: UACR  Follow Up:  PCP f/u this week.  Follow up with pharmacist via telephone ~4 weeks after PCP or sooner as needed  Future Appointments  Date Time Provider Department Center  04/06/2023  9:40 AM Doreene Nest, NP [facility]  05/05/2023 10:30 AM [facility] PHARMACIST [facility]   Loree Fee, PharmD Clinical Pharmacist [facility] [phone]

## 2023-04-06 ENCOUNTER — Encounter: Payer: Self-pay | Admitting: Primary Care

## 2023-04-06 ENCOUNTER — Ambulatory Visit (INDEPENDENT_AMBULATORY_CARE_PROVIDER_SITE_OTHER): Payer: Medicare Other | Admitting: Primary Care

## 2023-04-06 VITALS — BP 108/60 | HR 91 | Temp 97.5°F | Ht 78.0 in | Wt 363.0 lb

## 2023-04-06 DIAGNOSIS — I10 Essential (primary) hypertension: Secondary | ICD-10-CM | POA: Diagnosis not present

## 2023-04-06 DIAGNOSIS — N182 Chronic kidney disease, stage 2 (mild): Secondary | ICD-10-CM | POA: Diagnosis not present

## 2023-04-06 DIAGNOSIS — E118 Type 2 diabetes mellitus with unspecified complications: Secondary | ICD-10-CM

## 2023-04-06 DIAGNOSIS — Z7985 Long-term (current) use of injectable non-insulin antidiabetic drugs: Secondary | ICD-10-CM

## 2023-04-06 DIAGNOSIS — Z7984 Long term (current) use of oral hypoglycemic drugs: Secondary | ICD-10-CM

## 2023-04-06 DIAGNOSIS — Z23 Encounter for immunization: Secondary | ICD-10-CM | POA: Diagnosis not present

## 2023-04-06 DIAGNOSIS — Z89512 Acquired absence of left leg below knee: Secondary | ICD-10-CM | POA: Diagnosis not present

## 2023-04-06 LAB — POCT GLYCOSYLATED HEMOGLOBIN (HGB A1C): Hemoglobin A1C: 6.9 % — AB (ref 4.0–5.6)

## 2023-04-06 MED ORDER — SEMAGLUTIDE (2 MG/DOSE) 8 MG/3ML ~~LOC~~ SOPN: 2.0000 mg | PEN_INJECTOR | SUBCUTANEOUS | Status: DC

## 2023-04-06 NOTE — Progress Notes (Signed)
 Subjective:    Patient ID: Kenneth Cohen, male    DOB: Jul 23, 1965, 58 y.o.   MRN: [id number]  HPI  Kenneth Cohen is a very pleasant 58 y.o. male with a history of type 2 diabetes, hypertension, CKD, hyperlipidemia, bilateral BKA who presents today for follow-up of diabetes.  His wife joins [place] today.  Current medications include: Tresiba U200, 150 units daily, Ozempic 1 mg weekly, NovoLog 4 units 3 times daily with meals, Glipizide 5 XL 5 mg daily, metformin ER 1000 mg daily.  Pharmacy following regularly, last visit was completed via telemedicine on 04/05/23, no changes made. Future considerations were to increase Ozempic to 2 mg weekly, increase metformin vs increase glipizide with reduction in Novolog. Normal renal function in November 2024.   He is checking his blood glucose continuously. Average glucose reading from 03/04-03/17: 144, Time in range: 87%, high: 12%, very high: 1%, hypoglycemic episodes: 0%.   Last A1C: 10.0 in November 2024, 6.9 today. Last Eye Exam: Due Last Foot Exam: N/A, bilateral BKA Pneumonia Vaccination: Prevnar 13 in 2016, Pneumovax 23 in 2017 Urine Microalbumin: Due  Statin: atorvastatin   Dietary changes since last visit: He's cut back on his fast food, intake of sweets, and overall portion sizes. He does continue to eat pizza and burgers, just knows his limit now.  He does snack on processed carbs but much less overall.    Exercise: Weight resistance with upper and lower extremities.   He does not wish to increase his metformin dose due to his overall pill count now. He does very well with Glipizide, would be interested in increasing Glipizide.   Wt Readings from Last 3 Encounters:  04/06/23 (!) 363 lb (164.7 kg)  12/02/22 300 lb (136.1 kg)  07/14/21 300 lb (136.1 kg)     Review of Systems  Respiratory:  Negative for shortness of breath.   Cardiovascular:  Negative for chest pain.  Gastrointestinal:  Negative for abdominal pain,  constipation and diarrhea.  Neurological:  Negative for dizziness.         Past Medical History:  Diagnosis Date   Charcot's joint, left ankle and foot 09/07/2015   Diabetic Charcot foot (HCC)    Diabetic infection of right foot (HCC) 12/09/2020   Diabetic ulcer of foot with fat layer exposed (HCC) 11/12/2020   Hypertension    Obesity    Subacute osteomyelitis, right ankle and foot (HCC) 12/09/2020   Testicular torsion    as a child    Social History   Socioeconomic History   Marital status: Married    Spouse name: Not on file   Number of children: Not on file   Years of education: Not on file   Highest education level: Some college, no degree  Occupational History   Not on file  Tobacco Use   Smoking status: Some Days    Types: E-cigarettes   Smokeless tobacco: Never  Vaping Use   Vaping status: Every Day  Substance and Sexual Activity   Alcohol use: No    Alcohol/week: 0.0 standard drinks of alcohol    Comment: rarely   Drug use: No   Sexual activity: Not on file  Other Topics Concern   Not on file  Social History Narrative   Married.   One son, junior in [employer].   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   Enjoys playing on the computer.   Social Drivers of Health   Financial Resource Strain: Low Risk  (01/05/2023)  Overall Financial Resource Strain (CARDIA)    Difficulty of Paying Living Expenses: Not very hard  Food Insecurity: No Food Insecurity (01/05/2023)   Hunger Vital Sign    Worried About Running Out of Food in the Last Year: Never true    Ran Out of Food in the Last Year: Never true  Transportation Needs: No Transportation Needs (01/05/2023)   PRAPARE - Administrator, Civil Service (Medical): No    Lack of Transportation (Non-Medical): No  Physical Activity: Sufficiently Active (01/05/2023)   Exercise Vital Sign    Days of Exercise per Week: 5 days    Minutes of Exercise per Session: 40 min  Stress: No Stress Concern  Present (01/05/2023)   [employer] of Occupational Health - Occupational Stress Questionnaire    Feeling of Stress : Only a little  Social Connections: Moderately Isolated (01/05/2023)   Social Connection and Isolation Panel [NHANES]    Frequency of Communication with Friends and Family: More than three times a week    Frequency of Social Gatherings with Friends and Family: Once a week    Attends Religious Services: Never    Database administrator or Organizations: No    Attends Engineer, structural: Not on file    Marital Status: Married  Catering manager Violence: Not on file    Past Surgical History:  Procedure Laterality Date   AMPUTATION Right 12/13/2020   Procedure: AMPUTATION BELOW KNEE;  Surgeon: Nadara Mustard, MD;  Location: [facility] OR;  Service: Orthopedics;  Laterality: Right;   APPLICATION OF WOUND VAC Right 12/13/2020   Procedure: APPLICATION OF WOUND VAC;  Surgeon: Nadara Mustard, MD;  Location: [facility] OR;  Service: Orthopedics;  Laterality: Right;    Family History  Problem Relation Age of Onset   Hypertension Mother    Diabetes Mother    Diabetes Father     Allergies  Allergen Reactions   Cefaclor Itching, Swelling and Other (See Comments)    Reaction:  All-over body swelling   Did receive and tolerate cefazolin on adm 06/2016    Iodinated Contrast Media Hives, Itching and Other (See Comments)    Immediately after receiving the iv contrast, patient started itching and developed welts on his forehead, back of head, chest and back.  ER Doctor and RN were notified and patient was told he should always be premedicated prior to receiving IV contrast.   Wound Dressing Adhesive Itching and Rash    Current Outpatient Medications on File Prior to Visit  Medication Sig Dispense Refill   amLODipine (NORVASC) 10 MG tablet Take 1 tablet (10 mg total) by mouth daily. for blood pressure. 90 tablet 3   atorvastatin (LIPITOR) 20 MG tablet TAKE 1 TABLET BY MOUTH EVERY  DAY FOR CHOLESTEROL 90 tablet 2   glipiZIDE (GLUCOTROL XL) 5 MG 24 hr tablet Take 1 tablet (5 mg total) by mouth daily with breakfast. for diabetes. 90 tablet 1   hydrochlorothiazide (HYDRODIURIL) 25 MG tablet Take 1 tablet (25 mg total) by mouth daily. For blood pressure. 90 tablet 3   insulin aspart (NOVOLOG FLEXPEN) 100 UNIT/ML FlexPen Inject 60-70 units sq TIDAC. Starting with 60 units TIDAC, increase only as instructed by your providers. MDD 210 unit. 30mL=29 day supply 60 mL 0   insulin degludec (TRESIBA FLEXTOUCH) 200 UNIT/ML FlexTouch Pen Inject 110-130 Units into the skin daily. Take 110 units daily, increase only as instructed by your provider. MDD 130 units. 18 mL=28 day supply. (Patient taking  differently: Inject 150 Units into the skin daily. Take 110 units daily, increase only as instructed by your provider. MDD 130 units. 18 mL=28 day supply.) 18 mL 0   Insulin Pen Needle (RELION MINI PEN NEEDLES) 31G X 6 MM MISC Use three times daily with insulin 360 each 3   metFORMIN (GLUCOPHAGE-XR) 500 MG 24 hr tablet TAKE 2 TABLETS BY MOUTH DAILY WITH BREAKFAST FOR DIABETES 180 tablet 0   olmesartan (BENICAR) 40 MG tablet Take 1 tablet (40 mg total) by mouth daily. for blood pressure. 90 tablet 3   Current Facility-Administered Medications on File Prior to Visit  Medication Dose Route Frequency Provider Last Rate Last Admin   mupirocin cream (BACTROBAN) 2 %   Topical BID Hyatt, Max T, DPM        BP 108/60   Pulse 91   Temp (!) 97.5 F (36.4 C) (Temporal)   Ht 6\' 6"  (1.981 m)   Wt (!) 363 lb (164.7 kg)   SpO2 98%   BMI 41.95 kg/m  Objective:   Physical Exam Cardiovascular:     Rate and Rhythm: Normal rate and regular rhythm.  Pulmonary:     Effort: Pulmonary effort is normal.     Breath sounds: Normal breath sounds.  Musculoskeletal:     Cervical back: Neck supple.  Skin:    General: Skin is warm and dry.  Neurological:     Mental Status: He is alert and oriented to person,  place, and time.  Psychiatric:        Mood and Affect: Mood normal.           Assessment & Plan:  Diabetes mellitus type 2 with complications [facility]) Assessment & Plan: Significant improvement today with A1C of 6.9!!!! Reviewed pharmacy office notes from February and March 2025.  Increase Ozempic to 2 mg weekly. Continue metformin XL 1000 mg daily, Glipizide XL 5 mg daily, Tresiba 150 units daily, Novolog 40 units TID with meals for now.  Consider increasing Glipizide XL to 10 mg with reduction of Novolog. Continue working with pharmacy team, appreciate the collaboration.  Also consider switching to [facility] if needed. Will hold off for now.   Pneumonia vaccine updated today. Renal function pending today. He is unable to urinate for urine microalbumin today.  Follow up in 3 months.   Orders: -     POCT glycosylated hemoglobin (Hb A1C) -     Cystatin C with Glomerular Filtration Rate, Estimated (eGFR) -     Semaglutide (2 MG/DOSE); Inject 2 mg as directed once a week. for diabetes.        Doreene Nest, NP

## 2023-04-06 NOTE — Patient Instructions (Addendum)
 We increased your dose of Ozempic to 2 mg weekly.   Continue your other medications. Follow up with [name] as scheduled.   Please schedule a follow up visit for 3 months.  It was a pleasure to see you today!

## 2023-04-06 NOTE — Assessment & Plan Note (Addendum)
 Significant improvement today with A1C of 6.9!!!! Reviewed pharmacy office notes from February and March 2025.  Increase Ozempic to 2 mg weekly. Continue metformin XL 1000 mg daily, Glipizide XL 5 mg daily, Tresiba 150 units daily, Novolog 40 units TID with meals for now.  Consider increasing Glipizide XL to 10 mg with reduction of Novolog. Continue working with pharmacy team, appreciate the collaboration.  Also consider switching to [facility] if needed. Will hold off for now.   Pneumonia vaccine updated today. Renal function pending today. He is unable to urinate for urine microalbumin today.  Follow up in 3 months.

## 2023-04-08 LAB — CYSTATIN C WITH GLOMERULAR FILTRATION RATE, ESTIMATED (EGFR)
CYSTATIN C: 1.95 mg/L — ABNORMAL HIGH (ref 0.52–1.23)
eGFR: 32 mL/min/{1.73_m2} — ABNORMAL LOW (ref >=60)

## 2023-04-12 ENCOUNTER — Encounter: Payer: Self-pay | Admitting: Pharmacist

## 2023-04-12 NOTE — Progress Notes (Unsigned)
 Patient Assistance Program (PAP) Application   Manufacturer: [employer] ([employer])    (New enrollment) Medication(s): Farxiga 10 mg tablet  Patient Portion of Application:  04/12/23: Mailed to patient's home from clinic per patient request.  Income Documentation: N/A - Electronic verification elected.  Provider Portion of Application:  04/12/23: Provider portion completed by Pharmacist and placed in PCP inbox for signature. 3/26 Prescription(s): Included in MAP application. Farxiga 10 mg once daily    Application Status: Not submitted (pending signatures)  Next Steps: [x]    3/26 - application filled out, placed in PCP office folder for signature []    Once PCP has signed, application to be placed in front office patient folder "L" for last name Cuello []    Upon patient signature Application to be faxed to [employer] Fax: [phone] with copy of patient's insurance card AND scanned into patient chart   Forwarded to [facility] Patient Advocate Team for future correspondences/re-enrollment.  Note routed to PCP Clinic Pool to ensure PCP signature is obtained and application is faxed.  *[facility] clinic team - Please Addend/update this note as the "Next Steps" are completed in office*

## 2023-04-12 NOTE — Progress Notes (Signed)
 Renal Dosing Considerations:  Lab results Lab Results  Component Value Date   GFR 72.54 12/02/2022   CREATININE 1.13 12/02/2022   CYSTATIN C 1.95 High  04/06/2023  eGFR 04/06/2023   CKD-Epi eGFR Calculations:  CKD-EPI creatinine equation (2021) = 76 CKD-EPI creatinine-cystatin equation (2021) = 48 CKD-EPI cystatin C equation (2012) = 32  Assessment & Plan  Notably, [facility] seems to have reported eGFR(cyst) via 2012 CKD-Epi equation. This is outdated.  KDIGO guidelines recommend 2021 CKD Epi equation for eGFR calculations. When considering Cystatin C, cystatin c should not be used alone, rather, the difference between creatinine and cystatin c. While no creatinine was ordered on the same day, baseline has been stable ~1.13 mg/dL.  eGFRcr-cys = 48 mL/min/1.12m2 When there is concordance between the two, there is a high rate of accuracy  When there is discordance, eGFRcr-cys is more accurate than either eGFRcr or eGFRcys.  Renal Dosing Consideration: Currently taking Metformin XR 1000 mg daily eGFR >45 mL/minute/1.73 m2: Maximum dose 500 mg BID with continued monitoring of kidney function. Reasonable to continue. If eGFR(creat-cyst) < 30, discontinue use.  Recommendation: 1000 mg once daily ? 500 mg twice daily Have also previously discussed SGLT2i with patient. Reasonable to consider replacing metformin with SGLT2i  All other medications as of 3/24 are dosed appropriately based on kidney function.   Current Outpatient Medications on File Prior to Visit  Medication Sig   amLODipine (NORVASC) 10 MG tablet Take 1 tablet (10 mg total) by mouth daily. for blood pressure.   atorvastatin (LIPITOR) 20 MG tablet TAKE 1 TABLET BY MOUTH EVERY DAY FOR CHOLESTEROL   glipiZIDE (GLUCOTROL XL) 5 MG 24 hr tablet Take 1 tablet (5 mg total) by mouth daily with breakfast. for diabetes.   hydrochlorothiazide (HYDRODIURIL) 25 MG tablet Take 1 tablet (25 mg total) by mouth daily. For blood pressure.    insulin aspart (NOVOLOG FLEXPEN) 100 UNIT/ML FlexPen Inject 60-70 units sq TIDAC. Starting with 60 units TIDAC, increase only as instructed by your providers. MDD 210 unit. 76mL=29 day supply   insulin degludec (TRESIBA FLEXTOUCH) 200 UNIT/ML FlexTouch Pen Inject 110-130 Units into the skin daily. Take 110 units daily, increase only as instructed by your provider. MDD 130 units. 18 mL=28 day supply. (Patient taking differently: Inject 150 Units into the skin daily. Take 110 units daily, increase only as instructed by your provider. MDD 130 units. 18 mL=28 day supply.)   metFORMIN (GLUCOPHAGE-XR) 500 MG 24 hr tablet TAKE 2 TABLETS BY MOUTH DAILY WITH BREAKFAST FOR DIABETES   olmesartan (BENICAR) 40 MG tablet Take 1 tablet (40 mg total) by mouth daily. for blood pressure.   Semaglutide, 2 MG/DOSE, 8 MG/3ML SOPN Inject 2 mg as directed once a week. for diabetes.

## 2023-04-14 NOTE — Patient Instructions (Addendum)
 Hello Kenneth Cohen,   It was a pleasure to speak with you today! As we discussed:?   Continue medications as you have been taking them We have started an application for the medicine that will replace the metformin tablets.  This medication is called Marcelline Deist (you may have also heard of it's "cousin" Jardiance).  These medicines were made to help with diabetes, but as we discussed, we have also seen significant benefit in prevention of kidney disease progression which we measure in people with diabetes via protein in the urine (usually, the kidneys don't let protein pass through the urine but when the blood sugars are elevated, the kidneys can feel stressed/leaky and we start to see protein)  Tomorrow, when you are here for your wife's appointment: I'm not sure how booked the lab is this week when you will be here for you wife's appointment, but consider asking if you could try again for the urine sample to check this protein level.  Please sign the application for the new medicine we discussed today (Farxiga). This program is just like [employer] you are enrolled in. This application has been given to [place] to sign and should be in the front office for your signature tomorrow.    Please reach out prior to your next scheduled appointment should you have any questions or concerns.  Thank you!   Future Appointments  Date Time Provider Department Center  05/05/2023 10:30 AM [facility] PHARMACIST [facility]    Loree Fee, PharmD Clinical Pharmacist [facility] [phone]

## 2023-04-15 DIAGNOSIS — E113393 Type 2 diabetes mellitus with moderate nonproliferative diabetic retinopathy without macular edema, bilateral: Secondary | ICD-10-CM | POA: Diagnosis not present

## 2023-04-16 ENCOUNTER — Other Ambulatory Visit: Payer: Self-pay | Admitting: Primary Care

## 2023-04-16 ENCOUNTER — Telehealth: Payer: Self-pay

## 2023-04-16 DIAGNOSIS — E118 Type 2 diabetes mellitus with unspecified complications: Secondary | ICD-10-CM

## 2023-04-16 NOTE — Telephone Encounter (Signed)
 Received 3 boxes of 32g needles through patient assistance Left voicemail notifying patient ready for pickup.  Box is under [employer].

## 2023-04-22 ENCOUNTER — Telehealth: Payer: Self-pay

## 2023-04-22 NOTE — Telephone Encounter (Signed)
 PAP: Patient assistance application for Marcelline Deist has been approved by PAP Companies: [employer] from 04/20/2023 to 01/19/2024. Medication should be delivered to PAP Delivery: Home. For further shipping updates, please contact [employer] ([employer]) at [phone]. Patient ID is: [id number]    Approval letter has been uploaded in media tab

## 2023-04-23 NOTE — Telephone Encounter (Signed)
 Patients spouse picked up medication supplies

## 2023-04-27 ENCOUNTER — Encounter: Payer: Self-pay | Admitting: Pharmacist

## 2023-04-27 NOTE — Progress Notes (Signed)
 Brief Telephone Documentation Reason for Call: Patient called reporting he has opened his last box of Tresiba U200 (3 pens).   Summary of Call: Has not called [employer] yet to see if shipment has been sent/worked on Confirms still using 150 units [place] daily 3 Tresiba U200 pens = 12 day supply (150 unit/day)  Follow up: - Will call [employer] Wednesday.  - Automated system today not working properly.    Loree Fee, PharmD Clinical Pharmacist [facility] [phone]

## 2023-04-29 ENCOUNTER — Encounter: Payer: Self-pay | Admitting: Pharmacist

## 2023-04-29 ENCOUNTER — Other Ambulatory Visit: Payer: Self-pay | Admitting: Pharmacist

## 2023-04-29 DIAGNOSIS — E118 Type 2 diabetes mellitus with unspecified complications: Secondary | ICD-10-CM

## 2023-04-29 MED ORDER — TRESIBA FLEXTOUCH 200 UNIT/ML ~~LOC~~ SOPN: PEN_INJECTOR | SUBCUTANEOUS | 0 refills | Status: AC

## 2023-04-29 NOTE — Progress Notes (Addendum)
 Brief Telephone Documentation Outgoing: [employer] 04/29/23  Summary of Call: Novo states the medication refill order was canceled due to needing clarification of PCP's state license #. License # and exp date correct on most recent application sent to novo 3/18 which was approved 3/20. No reason for this order to have been canceled without contacting the clinic.   Voucher received for Kenneth Cohen U200 14-month supply pended to local pharmacy BIN: [id number] PCN: [id number] GRP: [id number] Voucher ID: [id number] Medication: Kenneth Cohen 200U/ml FlexTouch  Enrollment ends: 12/09/2023  Patient's Novo ID: [id number]  Follow Up: Patient given direct line for further questions/concerns.  Loree Fee, PharmD Clinical Pharmacist [facility] [phone]

## 2023-05-03 ENCOUNTER — Other Ambulatory Visit (INDEPENDENT_AMBULATORY_CARE_PROVIDER_SITE_OTHER): Payer: Self-pay | Admitting: Pharmacist

## 2023-05-03 DIAGNOSIS — E118 Type 2 diabetes mellitus with unspecified complications: Secondary | ICD-10-CM

## 2023-05-03 DIAGNOSIS — Z89512 Acquired absence of left leg below knee: Secondary | ICD-10-CM

## 2023-05-03 DIAGNOSIS — Z89511 Acquired absence of right leg below knee: Secondary | ICD-10-CM

## 2023-05-03 DIAGNOSIS — N182 Chronic kidney disease, stage 2 (mild): Secondary | ICD-10-CM

## 2023-05-03 NOTE — Progress Notes (Unsigned)
 Brief Telephone Documentation Reason for Call: Patient called to report his Farxiga was delivered from [employer] today.  Summary of Call: Farxiga 10 mg tablet delivered. Patient has not started taking it yet.  Confirms adherence to current regimen including reduced-dose metformin.   Plan: As previously discussed, patient is to  Discontinue metformin Start Farxiga tomorrow, 10 mg each morning  Discussed need for repeat kidney labs ~4 weeks after starting Farxiga (BMP, cyst-c ordered)  Follow Up: Patient given direct line for further questions/concerns.  Was already on the schedule for phone check-in later this week to review sugars.   Daron Ellen, PharmD Clinical Pharmacist [facility] [phone]

## 2023-05-04 ENCOUNTER — Telehealth: Payer: Self-pay

## 2023-05-04 MED ORDER — DAPAGLIFLOZIN PROPANEDIOL 10 MG PO TABS: 10.0000 mg | ORAL_TABLET | Freq: Every day | ORAL | Status: DC

## 2023-05-04 NOTE — Telephone Encounter (Signed)
 Patients spouse picked up boxes of medication

## 2023-05-04 NOTE — Telephone Encounter (Signed)
 Receives 4 boxes of Ozempic through PAP Labeled and placed in middle fridge.  Patient notified.

## 2023-05-05 ENCOUNTER — Other Ambulatory Visit (INDEPENDENT_AMBULATORY_CARE_PROVIDER_SITE_OTHER)

## 2023-05-05 DIAGNOSIS — E118 Type 2 diabetes mellitus with unspecified complications: Secondary | ICD-10-CM

## 2023-05-05 DIAGNOSIS — N182 Chronic kidney disease, stage 2 (mild): Secondary | ICD-10-CM

## 2023-05-05 NOTE — Progress Notes (Unsigned)
 05/05/2023 Name: Kenneth Cohen MRN: [id number] DOB: May 14, 1965  Brief Note: Insulin Titration   Subjective  Chief Complaint  Patient presents with   Diabetes   Chronic Kidney Disease   Reason for visit: ?  Kenneth Cohen is a 58 y.o. male with a history of diabetes (type 2).?  PMH: HTN, HLD, chronic venous insufficiency s/p bilateral BKA (2022 exam/noninvasive assessment w no evidence of significant PAD), neuropathy, anxiety.  Known DM Complications: diabetic kidney disease, neuropathy  Care Team: Primary Care Provider: Doreene Nest, NP  Recent Summary of Change: 4/15: Start Farxiga 10 mg daily, stop metformin 2/21: ??Tresiba 150 u/d (10% increase). If FBG remain >130-140 after 1 week, ??Tresiba 160 u/d 2/20: Ozempic titration to 1.0 mg weekly 1/28:?? Tresiba 130 (6.6% per pt pref) unit/day; s/p 1 week if FBG >140, ??136 unit/d 1/10: ?? Tresiba to 122 unit/day; ??Novolog 40 unit before meals 12/30: ?? Tresiba 116 unit (10%); ??Novolog 45 unit TIDAC (10%). Novo Med Refill form filled out for addition of desglucagon pens on next shipment 12/20: Increase Tresiba to 106 units daily 12/18: Transition to new insulins + start Ozempic 0.25 mg weekly Novolin N 60 units BID ? Tresiba U200 100 units daily and increase 2 units q3-4 days if FBG >130 mg/dL. Max 110 units (low starting dose per pt pref) Novolin R 50 unit TID ? Novolog 50 unit TIDAC. 11/15: Application submitted for Novo MAP (insulins/Ozempic) 11/13: Re-establishing care after >1 yr. Off of metformin >1 yr 2/2 GI upset. Start metformin XR, start glipizide 5 mg with breakfast   HPI Patient report doing well since last PCP visit. Confirms he received Trulicity from the pharmacy via [employer] without issues. Ozempic shipment was picked up by wife this week.    Reported DM Regimen: ?  Tresiba U200 150 units qam Novolog 40 units TID before meals Ozempic 1.0 mg weekly Glipizide 5 mg  once daily before  breakfast Farxiga 10 mg daily (started 4/15)   SMBG: Dexcom G7 (using phone as reader)   Previous Visit     Hypo/Hyperglycemia: ?  Symptoms of hypoglycemia since last visit:? no   Hx Hypoglycemia: No  Reported Diet: Typically eats 3 meals per day. Tries to be mindful of eating food prepared at home rather than fast food. Significantly cut back amount he is eating. Drastic reduction in sugar intake/sweets.  Focus has been on protein and portions. Having eggs. Meat is usually chicken.   Exercise: Trying to increase movement throughout the day. Works out almost every day now.     _______________________________________________  Objective     Labs:?  Lab Results  Component Value Date   HGBA1C 6.9 (A) 04/06/2023   HGBA1C 10.0 (A) 12/02/2022   HGBA1C 7.4 (A) 09/02/2021   GLUCOSE 217 (H) 12/02/2022   MICRALBCREAT 1.4 06/26/2014   CREATININE 1.13 12/02/2022   CREATININE 1.18 04/22/2021   CREATININE 0.95 12/15/2020   GFR 72.54 12/02/2022   GFR 69.65 04/22/2021   GFR 60.21 04/30/2020     Chemistry      Component Value Date/Time   NA 137 12/02/2022 0809   K 4.1 12/02/2022 0809   CL 101 12/02/2022 0809   CO2 29 12/02/2022 0809   BUN 18 12/02/2022 0809   CREATININE 1.13 12/02/2022 0809      Component Value Date/Time   CALCIUM 9.4 12/02/2022 0809   ALKPHOS 64 12/02/2022 0809   AST 32 12/02/2022 0809   ALT 54 (H) 12/02/2022 0809   BILITOT  0.5 12/02/2022 0809     The 10-year ASCVD risk score (Arnett DK, et al., 2019) is: 19.1%  Assessment and Plan:   1. Diabetes, type 2: controlled with significant improvement A1c 6.9% (3/18). Goal <7% w/o hypoglycemia. GMI on Dexcom slightly worsened from previous (6.7 ?7.4% possible in part due to metformin adjustment though patient notes he has been under increased stress the past month which has affected lifestyle changes. Reports motivation to get back on track this month.  This week, has started Farxiga and is due to increase Ozempic  to 2.0 mg dose. Expect A1c control to improve over the next couple of weeks therefore no insulin change today.     Current Medications: Tresiba 150 unit daily, Novolog 40 unit TIDAC, Ozempic 1.0 mg weekly, Glipizide 5 mg once daily before breakfast, Metformin XR 500 mg twice daily  DUE: UACR (unable to void @last  PCP visit) Increase Ozempic to 2.0 mg weekly (received x4 months from [facility] this week) Continue Tresiba 150 units/day Continue Novolog 40 units TIDAC If any concern for lows following meals, reduce Novolog dose 10% prior to that meal (36 units) Continue Farxiga 10 mg daily, labs in 4 weeks Lab visit scheduled 5/14 (BMP, cyst-c, UACR)  Future Consideration: Glipizide: As needed, room to increase glipizide dose to 10 mg daily (or 5 BID) with a reduction in Novolog dose at breakfast/lunch though optimization of other therapies preferred. Good response to glipizide historically.    2. Kidney disease: stable. Last UACR almost 10 years ago. Pt unable to void at last PCP office visit. Agreeable to collection at May lab visit. Farxiga started yesterday w/o concerns.  Continue olmesartan 40 mg daily Continue Farxiga 10 mg daily (05/04/23) Future Consideration: For renal monitoring: BMP + cystatin-C reasonable if covered ongoing (Code = [id number])  Follow Up:  PCP f/u 2 months Labs 4 weeks PharmD telephone visit 2-3 weeks to review appropriateness of insulin doses s/p Ozempic/Farxiga changes this week.   Future Appointments  Date Time Provider Department Center  06/02/2023  8:15 AM [facility]  07/20/2023  2:00 PM Gabriel John, NP [facility]   Daron Ellen, PharmD Clinical Pharmacist [facility] [phone]

## 2023-05-05 NOTE — Patient Instructions (Addendum)
 Hello Mr Bellucci,   It was a pleasure to speak with you today! As we discussed:?   You may Increase Ozempic to 2.0 mg once weekly  You recently started taking Farxiga 10 mg tablet once daily  You may take this with or without food each morning.  We will repeat your kidney labs 1 month after starting Farxiga.   You have been scheduled for a lab-only visit on 06/02/23 at 8:15 am (we will collect normal blood work and a urine sample) I am also in clinic this day if you have any questions/concerns.   You are no longer taking metformin. Your medicine list has been updated.  Your pharmacy may still try to fill metformin if you are on automatic refill (you may let them know you are no longer taking this)  Continue your glipizide and insulins as you have been taking them. As reviewed, please reach out should you start to see sugars approaching the 70s or below and we can discuss possible reduction of these medicine doses.    Last visit we discussed Farxiga: These medicines were made to help with diabetes, but as we discussed, we have also seen significant benefit in prevention of kidney disease progression which we measure in people with diabetes via protein in the urine (usually, the kidneys don't let protein pass through the urine but when the blood sugars are elevated, the kidneys can feel stressed/leaky and we start to see protein)  Please reach out prior to your next scheduled appointment should you have any questions or concerns.  Thank you!   Future Appointments  Date Time Provider Department Center  06/02/2023  8:15 AM [facility]  07/20/2023  2:00 PM Gabriel John, NP [facility]    Daron Ellen, PharmD Clinical Pharmacist [facility] [phone]

## 2023-05-12 ENCOUNTER — Telehealth: Payer: Self-pay

## 2023-05-12 NOTE — Telephone Encounter (Signed)
 Received 12 boxes of Tresiba  through [facility].  Placed in middle fridge with Label Patient notified.

## 2023-05-12 NOTE — Telephone Encounter (Signed)
Wife picked up medication.

## 2023-06-01 ENCOUNTER — Other Ambulatory Visit: Admitting: Pharmacist

## 2023-06-01 DIAGNOSIS — E118 Type 2 diabetes mellitus with unspecified complications: Secondary | ICD-10-CM

## 2023-06-01 DIAGNOSIS — Z794 Long term (current) use of insulin: Secondary | ICD-10-CM

## 2023-06-01 DIAGNOSIS — Z7984 Long term (current) use of oral hypoglycemic drugs: Secondary | ICD-10-CM

## 2023-06-01 DIAGNOSIS — Z7985 Long-term (current) use of injectable non-insulin antidiabetic drugs: Secondary | ICD-10-CM

## 2023-06-01 NOTE — Progress Notes (Unsigned)
 06/02/2023 Name: Kenneth Cohen MRN: [id number] DOB: Sep 23, 1965  Brief Note: Insulin  Titration   Subjective  Chief Complaint  Patient presents with   Diabetes   Reason for visit: ?  Kenneth Cohen is a 58 y.o. male with a history of diabetes (type 2).?  PMH: HTN, HLD, chronic venous insufficiency s/p bilateral BKA (2022 exam/noninvasive assessment w no evidence of significant PAD), neuropathy, anxiety.  Known DM Complications: diabetic kidney disease, neuropathy  Care Team: Primary Care Provider: Gabriel John, NP  Recent Summary of Change: 4/16: ?? Ozempic  2.0 mg/wk 4/15: Start Farxiga  10 mg daily, stop metformin  (renal function) 2/21: ??Tresiba  150 u/d (10%). If FBG above goal, ??Tresiba  160 u/d 2/20: ?? Ozempic  1.0 mg weekly 1/28:?? Tresiba  130u/d (6.6% per pt pref) x7d. if FBG >140, ??136 unit/d 1/10: ?? Tresiba  to 122 u/d; ??Novolog  40u TIDAC 12/30: ?? Tresiba  116 u/d (10%); ??Novolog  45u TIDAC (10%) 12/20: ?? Tresiba  to 106 u/d 12/18: Transition to new insulins + start Ozempic  0.25 mg weekly Novolin  N 60 units BID ? Tresiba  U200 100 units daily and increase 2 units q3-4 days if FBG >130 mg/dL. Max 110 units (low starting dose per pt pref) Novolin  R 50 unit TID ? Novolog  50 unit TIDAC per significant hyperglycemia. 11/13: Re-establishing care after >1 yr. Off of metformin  >1 yr 2/2 GI upset. Start metformin  XR, start glipizide  5 mg with breakfast   HPI Reports doing well since last visit. Recent move to new home/working on home has affected meal patterns. Notes more fast-food/takeout which he feels has increased his sugars a bit.   Also notes not seeing significant difference in sugars on CGM with new medicines (though this is muddied by recent dietary changes), so he has held his glipizide  and Farxiga  over the past week to see if sugars would change. With this notes sugars were unchanged because they are "usually less than 200 mg/dL). One FBG this week >200 mg/dL.    Reported DM Regimen: ?  Tresiba  U200 150 units qam Novolog  40 units TID before meals Ozempic  2.0 mg weekly Glipizide  5 mg once daily before breakfast (not taking for past week) Farxiga  10 mg daily (started 4/15) (not taking for past week)   SMBG: Dexcom G7 (using phone as reader) Recently, reports several sensors malfunctioning (stopped reading sugars after a few days). Has been using glucometer in the meantime. Has not called Dexcom for replacements).   FBG: All <200 mg/dL (this morning was 409 - relates secondary to moving eating out) -  (no concrete numbers provided) Noon: All <200 mg/dL (no concrete numbers provided)  Hypo/Hyperglycemia: ?  Symptoms of hypoglycemia since last visit:? no   Hx Hypoglycemia: No  Reported Diet: Typically eats 3 meals per day. Tries to be mindful of eating food prepared at home rather than fast food. Significantly cut back amount he is eating. Drastic reduction in sugar intake/sweets.  Focus has been on protein and portions. Having eggs. Meat is usually chicken.  Since last report: Has been eating more takeout in the setting of recent move.   Exercise: Trying to increase movement throughout the day. Works out almost every day now. Recent move to new home has increased activity level.     _______________________________________________  Objective     Labs:?  Lab Results  Component Value Date   HGBA1C 6.9 (A) 04/06/2023   HGBA1C 10.0 (A) 12/02/2022   HGBA1C 7.4 (A) 09/02/2021   GLUCOSE 217 (H) 12/02/2022   MICRALBCREAT 1.4 06/26/2014  CREATININE 1.13 12/02/2022   CREATININE 1.18 04/22/2021   CREATININE 0.95 12/15/2020   GFR 72.54 12/02/2022   GFR 69.65 04/22/2021   GFR 60.21 04/30/2020     Chemistry      Component Value Date/Time   NA 137 12/02/2022 0809   K 4.1 12/02/2022 0809   CL 101 12/02/2022 0809   CO2 29 12/02/2022 0809   BUN 18 12/02/2022 0809   CREATININE 1.13 12/02/2022 0809      Component Value Date/Time   CALCIUM  9.4  12/02/2022 0809   ALKPHOS 64 12/02/2022 0809   AST 32 12/02/2022 0809   ALT 54 (H) 12/02/2022 0809   BILITOT 0.5 12/02/2022 0809     The 10-year ASCVD risk score (Arnett DK, et al., 2019) is: 19.1%  Assessment and Plan:   1. Diabetes, type 2: controlled with significant improvement A1c 6.9% (3/18). Goal <7% w/o hypoglycemia. However, when first diagnosed, had a provider tell him "as long as you're under 200 mg/dL, you're good" and continues to carry this notion forward. Over the past week has stopped all oral medications to see if sugars remain <200 mg/dL without them resulting in worsening of glycemic control. FBG reported all above goal of <130. This am >200 mg/dL.   Current Medications: Tresiba  150 unit daily, Novolog  40 unit TIDAC, Ozempic  1.0 mg weekly,            Resume Farxiga  and glipizide  Reviewed glycemic goals including fasting <130 mg/dL and BG >119 only immediately following meals. 2h PP goal <180 mg/dL.  Reviwed risks of hyperglycemia over time and rationale for these established BG goals (micro-/macrovascular complications).  Lab visit canceled by patient - Agreeable to re-scheduling in the next week. Declines rescheduling during phone visit - will check his schedule and call clinic.  Future Consideration: Glipizide : As needed, room to increase glipizide  dose to 10 mg daily (or 5 BID) with a reduction in Novolog  dose at breakfast/lunch though optimization of other therapies preferred. Good response to glipizide  historically.    2. Kidney disease: stable. Last UACR almost 10 years ago. Pt unable to void at last PCP office visit. Agreeable to collection at May lab visit. Farxiga  started yesterday w/o concerns.  Continue olmesartan  40 mg daily Continue Farxiga  10 mg daily (05/04/23) Future Consideration: For renal monitoring: BMP + cystatin-C reasonable if covered ongoing (Code = [id number])  Follow Up:  PCP f/u 1.5 months Labs in the next 7 days (5/14 visit canceled by  patient) PharmD telephone visit 2-3 weeks d/t adherence  Future Appointments  Date Time Provider Department Center  06/17/2023  2:30 PM [facility] PHARMACIST [facility]  07/20/2023  2:00 PM Gabriel John, NP [facility]   Daron Ellen, PharmD Clinical Pharmacist [facility] [phone]

## 2023-06-02 ENCOUNTER — Other Ambulatory Visit

## 2023-06-02 NOTE — Patient Instructions (Addendum)
 Hello Mr Delon,   It was a pleasure to speak with you today! As we discussed:?   You recently started taking Farxiga  10 mg tablet once daily  This medication is not as strong as Ozempic  for lowering the A1c number. Usually we will see a 1-point drop over time (example if A1c is 8%, may over time drop down to 7%).  For this reason, you may not note significant difference right away in your daily sugar readings.  However: This medication is very beneficial for kidney disease. We use this even in patients who do not have diabetes to preserve their kidney function.  Your kidney numbers were on the lower end so this medication is highly recommended as to keep your kidneys healthy and to prevent future worsening of kidney function.   These medicines were made to help with diabetes, but as we discussed, we have also seen significant benefit in prevention of kidney disease progression which we measure in people with diabetes via protein in the urine (usually, the kidneys don't let protein pass through the urine but when the blood sugars are elevated, the kidneys can feel stressed/leaky and we start to see protein)  Please reschedule your lab visit in the next week ideally. Let me know if you have any issues scheduling this.    We reviewed your blood sugar goals: - First thing in the morning before any food: Less than 130 mg/dL - Higher than 098 only if you have recently eaten. Ideally, within 2 hours after finishing your meal, sugars should be back down below 180 mg/dL.   DEXCOM replacement sensors: Dexcom Customer Support: If your sensor falls off before the 10 days are up, or if it stops working, you can request a replacement sensor by calling Dexcom support at [phone] or visit, [email or web address]. When completing the form, state that the sensor stopped early or it fell off.  Please reach out prior to your next scheduled appointment should you have any questions or  concerns.  Thank you!   Future Appointments  Date Time Provider Department Center  07/20/2023  2:00 PM Gabriel John, NP [facility]    Daron Ellen, PharmD Clinical Pharmacist [facility] [phone]

## 2023-06-08 ENCOUNTER — Other Ambulatory Visit: Payer: Self-pay | Admitting: Primary Care

## 2023-06-17 ENCOUNTER — Other Ambulatory Visit

## 2023-06-22 DIAGNOSIS — E785 Hyperlipidemia, unspecified: Secondary | ICD-10-CM

## 2023-06-22 MED ORDER — ATORVASTATIN CALCIUM 20 MG PO TABS: 20.0000 mg | ORAL_TABLET | Freq: Every day | ORAL | 1 refills | Status: DC

## 2023-06-24 ENCOUNTER — Other Ambulatory Visit (INDEPENDENT_AMBULATORY_CARE_PROVIDER_SITE_OTHER): Admitting: Pharmacist

## 2023-06-24 DIAGNOSIS — E118 Type 2 diabetes mellitus with unspecified complications: Secondary | ICD-10-CM

## 2023-06-24 NOTE — Progress Notes (Signed)
 Brief Telephone Documentation Reason for Call: CGM Review  Summary of Call: [facility]: Called patient to ensure access to CGM refills. He confirms he was able to contact Dexcom regarding sensor malfunction and they have sent him 3 replacement sensors. Placed a sensor ~1 week ago.   Compliance: Last visit, patient noted stopping all oral DM meds to see if sugars would change. Today, he confirms he has resumed both glipizide  and Jardiance since out last conversation.  Diet: Feels he is getting back on track s/p recent move. Still not back to baseline diet, some fast food given he has been busy with repairs.     Assessment/Plan: Patient feels he will get back on track with his lifestyle now that he is through with his move. For now would like to continue with medications as they are.  If continued FBG >130 consistently, advised to increase Tresiba  in 5-unit increment - e.g. from 150 to 155 unit/d Overdue for SGLT2i labs d/t patient cancelled.  Declines labs with his current schedule, though notes he may be able to make an apt next week (otherwise, PCP f/u scheduled 07/20/23)   Follow Up: Patient given direct line for further questions/concerns.  Daron Ellen, PharmD Clinical Pharmacist [facility] [phone]

## 2023-07-15 ENCOUNTER — Telehealth: Payer: Self-pay

## 2023-07-15 NOTE — Telephone Encounter (Signed)
 Received 3 boxes of 32G needles through P[facility].  Placed up front with reception in cabinet.  Patient has been notified.

## 2023-07-20 ENCOUNTER — Ambulatory Visit: Payer: Self-pay | Admitting: Primary Care

## 2023-07-20 ENCOUNTER — Ambulatory Visit: Admitting: Primary Care

## 2023-07-20 VITALS — BP 112/68 | HR 85 | Temp 98.2°F | Ht 78.0 in | Wt 355.0 lb

## 2023-07-20 DIAGNOSIS — E118 Type 2 diabetes mellitus with unspecified complications: Secondary | ICD-10-CM | POA: Diagnosis not present

## 2023-07-20 DIAGNOSIS — Z794 Long term (current) use of insulin: Secondary | ICD-10-CM | POA: Diagnosis not present

## 2023-07-20 LAB — POCT GLYCOSYLATED HEMOGLOBIN (HGB A1C): Hemoglobin A1C: 5.9 % — AB (ref 4.0–5.6)

## 2023-07-20 NOTE — Addendum Note (Signed)
 Addended by: ISADORA RAISIN on: 07/20/2023 02:43 PM   Modules accepted: Orders

## 2023-07-20 NOTE — Progress Notes (Signed)
 Subjective:    Patient ID: Kenneth Cohen, male    DOB: Mar 07, 1965, 58 y.o.   MRN: [id number]  HPI  LEVONE OTTEN is a very pleasant 58 y.o. male with a history of hypertension, type 2 diabetes, bilateral BKA, hyperlipidemia, neuropathy who presents today for follow-up of diabetes.  He is followed by our pharmacy team regularly.  Current medications include: Farxiga  10 mg daily, glipizide  XL 5 mg daily, Tresiba  200 unit/mL 150 units daily, Ozempic  2 mg weekly, NovoLog  4 units 3 times daily before meals  He is checking his blood glucose continuously with Dexcom CGM. His diet has been poor over the last 2 months due to a move to a new home. His glucose readings are ranging 130-low 200s.   Lowest reading in the 70's which occurred once.   He does not have his phone with him today.   Last A1C: 6.9 in March 2025. 5.9 today! Last Eye Exam: UTD, will get records  Last Foot Exam: N/A Pneumonia Vaccination: 2025 Urine Microalbumin: Due Statin: atorvastatin    Dietary changes since last visit: Poor diet over last 2 months due to a move. His appetite has decreased overall. Has been limiting bread and has eliminated soda.    Exercise: None  BP Readings from Last 3 Encounters:  07/20/23 112/68  04/06/23 108/60  12/02/22 (!) 142/78   Wt Readings from Last 3 Encounters:  07/20/23 (!) 355 lb (161 kg)  04/06/23 (!) 363 lb (164.7 kg)  12/02/22 300 lb (136.1 kg)     Review of Systems  Respiratory:  Negative for shortness of breath.   Cardiovascular:  Negative for chest pain.  Neurological:  Negative for dizziness.         Past Medical History:  Diagnosis Date   Charcot's joint, left ankle and foot 09/07/2015   Diabetic Charcot foot (HCC)    Diabetic infection of right foot (HCC) 12/09/2020   Diabetic ulcer of foot with fat layer exposed (HCC) 11/12/2020   Hypertension    Obesity    Subacute osteomyelitis, right ankle and foot (HCC) 12/09/2020   Testicular torsion    as  a child    Social History   Socioeconomic History   Marital status: Married    Spouse name: Not on file   Number of children: Not on file   Years of education: Not on file   Highest education level: 12th grade  Occupational History   Not on file  Tobacco Use   Smoking status: Some Days    Types: E-cigarettes   Smokeless tobacco: Never  Vaping Use   Vaping status: Every Day  Substance and Sexual Activity   Alcohol use: No    Alcohol/week: 0.0 standard drinks of alcohol    Comment: rarely   Drug use: No   Sexual activity: Not on file  Other Topics Concern   Not on file  Social History Narrative   Married.   One son, junior in [employer].   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   Enjoys playing on the computer.   Social Drivers of Corporate investment banker Strain: Low Risk  (07/20/2023)   Overall Financial Resource Strain (CARDIA)    Difficulty of Paying Living Expenses: Not hard at all  Food Insecurity: No Food Insecurity (07/20/2023)   Hunger Vital Sign    Worried About Running Out of Food in the Last Year: Never true    Ran Out of Food in the Last Year: Never true  Transportation Needs: No Transportation Needs (07/20/2023)   PRAPARE - Administrator, Civil Service (Medical): No    Lack of Transportation (Non-Medical): No  Physical Activity: Inactive (07/20/2023)   Exercise Vital Sign    Days of Exercise per Week: 0 days    Minutes of Exercise per Session: Not on file  Stress: No Stress Concern Present (07/20/2023)   [employer] of Occupational Health - Occupational Stress Questionnaire    Feeling of Stress: Only a little  Social Connections: Moderately Isolated (07/20/2023)   Social Connection and Isolation Panel    Frequency of Communication with Friends and Family: More than three times a week    Frequency of Social Gatherings with Friends and Family: Once a week    Attends Religious Services: Never    Database administrator or Organizations: No     Attends Engineer, structural: Not on file    Marital Status: Married  Catering manager Violence: Not on file    Past Surgical History:  Procedure Laterality Date   AMPUTATION Right 12/13/2020   Procedure: AMPUTATION BELOW KNEE;  Surgeon: Harden Jerona GAILS, MD;  Location: [facility] OR;  Service: Orthopedics;  Laterality: Right;   APPLICATION OF WOUND VAC Right 12/13/2020   Procedure: APPLICATION OF WOUND VAC;  Surgeon: Harden Jerona GAILS, MD;  Location: [facility] OR;  Service: Orthopedics;  Laterality: Right;    Family History  Problem Relation Age of Onset   Hypertension Mother    Diabetes Mother    Diabetes Father     Allergies  Allergen Reactions   Cefaclor Itching, Swelling and Other (See Comments)    Reaction:  All-over body swelling   Did receive and tolerate cefazolin on adm 06/2016    Iodinated Contrast Media Hives, Itching and Other (See Comments)    Immediately after receiving the iv contrast, patient started itching and developed welts on his forehead, back of head, chest and back.  ER Doctor and RN were notified and patient was told he should always be premedicated prior to receiving IV contrast.   Wound Dressing Adhesive Itching and Rash    Current Outpatient Medications on File Prior to Visit  Medication Sig Dispense Refill   amLODipine  (NORVASC ) 10 MG tablet Take 1 tablet (10 mg total) by mouth daily. for blood pressure. 90 tablet 3   atorvastatin  (LIPITOR) 20 MG tablet Take 1 tablet (20 mg total) by mouth daily. for cholesterol. 90 tablet 1   dapagliflozin  propanediol (FARXIGA ) 10 MG TABS tablet Take 1 tablet (10 mg total) by mouth daily before breakfast. for diabetes.     glipiZIDE  (GLUCOTROL  XL) 5 MG 24 hr tablet Take 1 tablet (5 mg total) by mouth daily with breakfast. for diabetes. 90 tablet 1   hydrochlorothiazide  (HYDRODIURIL ) 25 MG tablet Take 1 tablet (25 mg total) by mouth daily. For blood pressure. 90 tablet 3   insulin  aspart (NOVOLOG  FLEXPEN) 100 UNIT/ML  FlexPen Inject 60-70 units sq TIDAC. Starting with 60 units TIDAC, increase only as instructed by your providers. MDD 210 unit. 12mL=29 day supply 60 mL 0   insulin  degludec (TRESIBA  FLEXTOUCH) 200 UNIT/ML FlexTouch Pen Inject 150 units sq once daily. Please bill primary: BIN: [id number],  PCN: [id number],  GRP: [id number],  ID: [id number] 27 mL 0   Insulin  Pen Needle (RELION MINI PEN NEEDLES) 31G X 6 MM MISC Use three times daily with insulin  360 each 3   olmesartan  (BENICAR ) 40 MG tablet Take 1 tablet (40 mg total) by  mouth daily. for blood pressure. 90 tablet 3   Semaglutide , 2 MG/DOSE, 8 MG/3ML SOPN Inject 2 mg as directed once a week. for diabetes.     Current Facility-Administered Medications on File Prior to Visit  Medication Dose Route Frequency Provider Last Rate Last Admin   mupirocin  cream (BACTROBAN ) 2 %   Topical BID Hyatt, Max T, DPM        BP 112/68   Pulse 85   Temp 98.2 F (36.8 C) (Oral)   Ht 6' 6 (1.981 m)   Wt (!) 355 lb (161 kg)   SpO2 98%   BMI 41.02 kg/m  Objective:   Physical Exam  Cardiovascular:     Rate and Rhythm: Normal rate and regular rhythm.  Pulmonary:     Effort: Pulmonary effort is normal.     Breath sounds: Normal breath sounds.   Musculoskeletal:     Cervical back: Neck supple.   Skin:    General: Skin is warm and dry.   Neurological:     Mental Status: He is alert and oriented to person, place, and time.   Psychiatric:        Mood and Affect: Mood normal.           Assessment & Plan:  Diabetes mellitus type 2 with complications (HCC) Assessment & Plan: Controlled and improved with A1C today of 5.9!!!  Continue Ozempic  2 mg daily, Glipizide  Xl 5 mg daily, Novolog  40 units TID with meals, Tresiba  150 units daily, Farxiga  10 mg daily. He will update if he notices hypoglycemic episodes, discussed this today.   Urine microalbumin due and pending. He cannot urinate today but will try. Water provided.   Follow up in November. Continue  pharmacy follow up.  Orders: -     POCT glycosylated hemoglobin (Hb A1C) -     Microalbumin / creatinine urine ratio        Comer MARLA Gaskins, NP

## 2023-07-20 NOTE — Assessment & Plan Note (Addendum)
 Controlled and improved with A1C today of 5.9!!!  Continue Ozempic  2 mg daily, Glipizide  Xl 5 mg daily, Novolog  40 units TID with meals, Tresiba  150 units daily, Farxiga  10 mg daily. He will update if he notices hypoglycemic episodes, discussed this today.   Urine microalbumin due and pending. He cannot urinate today but will try. Water provided.   Follow up in November. Continue pharmacy follow up.

## 2023-07-20 NOTE — Patient Instructions (Signed)
 Schedule a follow up visit in November 2025  Keep up the good work!  It was a pleasure to see you today!

## 2023-07-22 ENCOUNTER — Other Ambulatory Visit: Payer: Self-pay

## 2023-07-22 DIAGNOSIS — E118 Type 2 diabetes mellitus with unspecified complications: Secondary | ICD-10-CM

## 2023-07-22 NOTE — Addendum Note (Signed)
 Addended by: Creston Klas E on: 07/22/2023 03:48 PM   Modules accepted: Orders

## 2023-07-23 LAB — MICROALBUMIN / CREATININE URINE RATIO
Creatinine, Urine: 80 mg/dL (ref 20–320)
Microalb Creat Ratio: 15 mg/g{creat} (ref <30)
Microalb, Ur: 1.2 mg/dL

## 2023-07-29 ENCOUNTER — Other Ambulatory Visit: Payer: Self-pay | Admitting: Primary Care

## 2023-07-29 DIAGNOSIS — E118 Type 2 diabetes mellitus with unspecified complications: Secondary | ICD-10-CM

## 2023-08-10 ENCOUNTER — Ambulatory Visit

## 2023-08-20 ENCOUNTER — Telehealth: Payer: Self-pay

## 2023-08-20 NOTE — Telephone Encounter (Signed)
 Received patient assistance medications for patient.  Ozempic  4boxes  Medications have been placed in the refrigerator and labeled with patient information  and Patient has been informed can pick up medications in our office during normal business hours.

## 2023-08-23 NOTE — Telephone Encounter (Signed)
 Patients wife picked up medication on behalf of patient

## 2023-08-29 IMAGING — CR DG FOOT COMPLETE 3+V*R*
3 series · 3 of 3 positions shown · non-contrast
Comparison: Radiographs from the [REDACTED] dated 12/09/2020
at [DATE] p.m., also MRI of the foot 12/02/2020

CLINICAL DATA: Weakness.  Diabetes.  Foot infection.

EXAM:
RIGHT FOOT COMPLETE - 3+ VIEW

[foot ap]
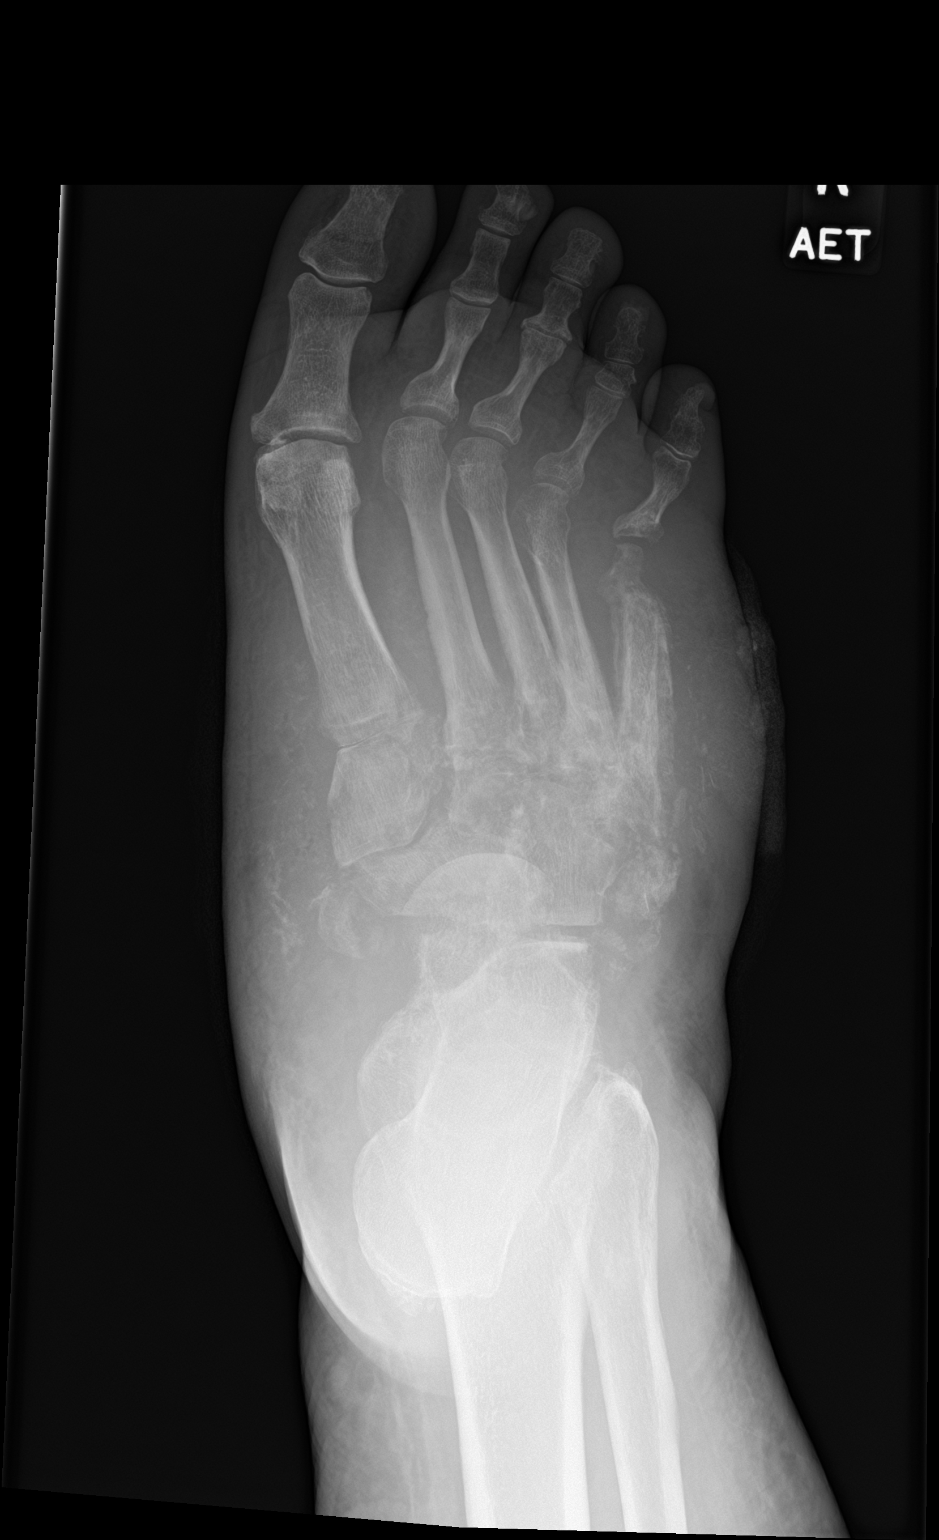

[foot obl]
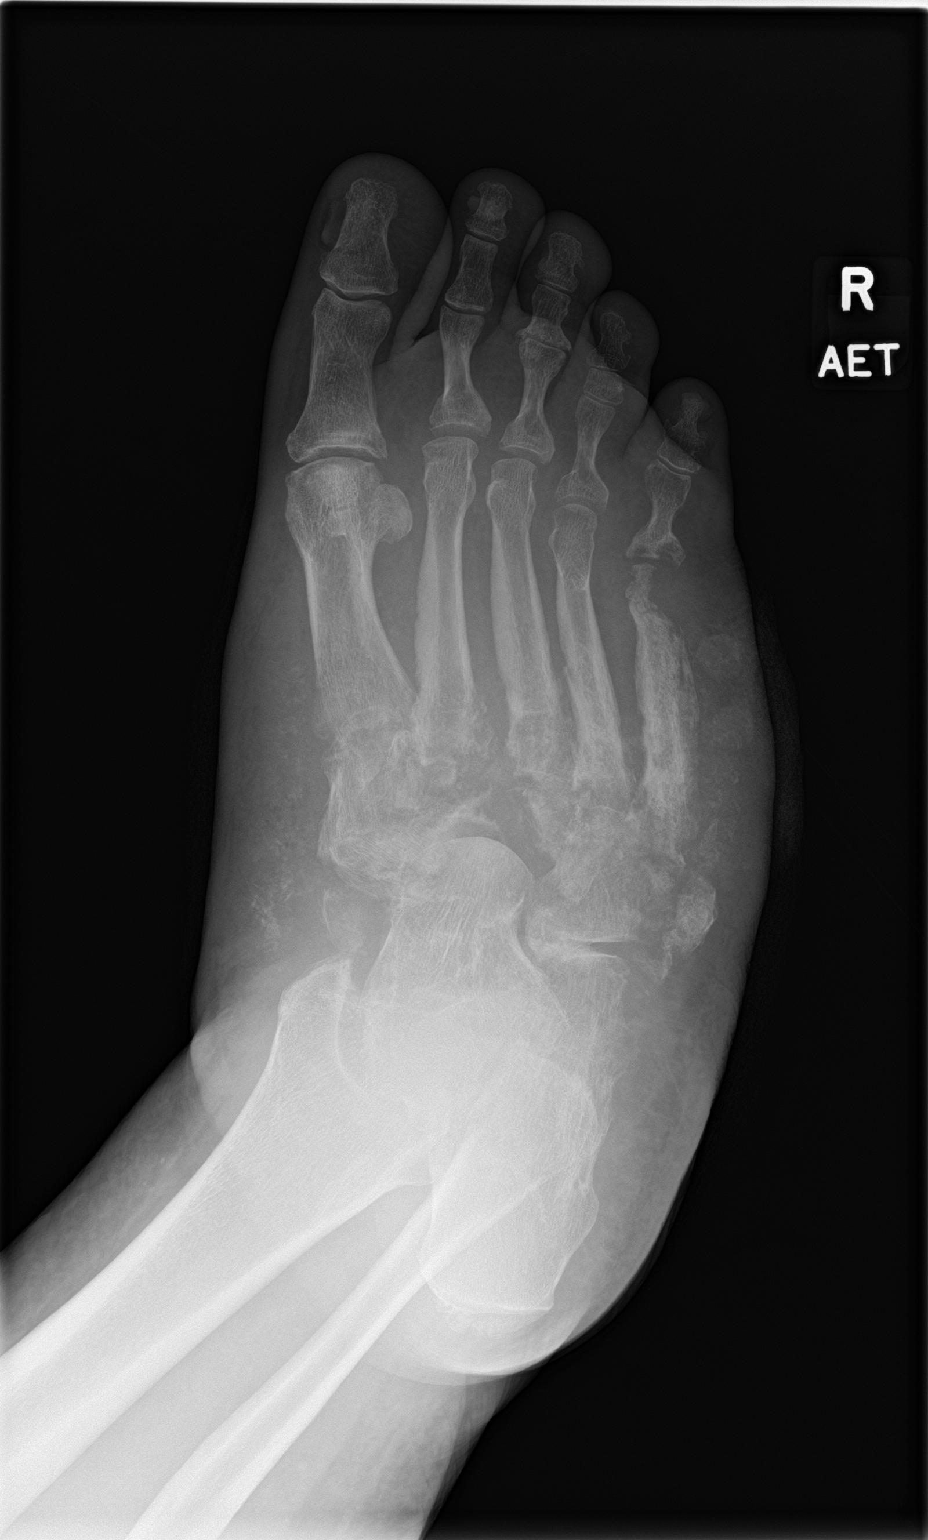

[foot lat]
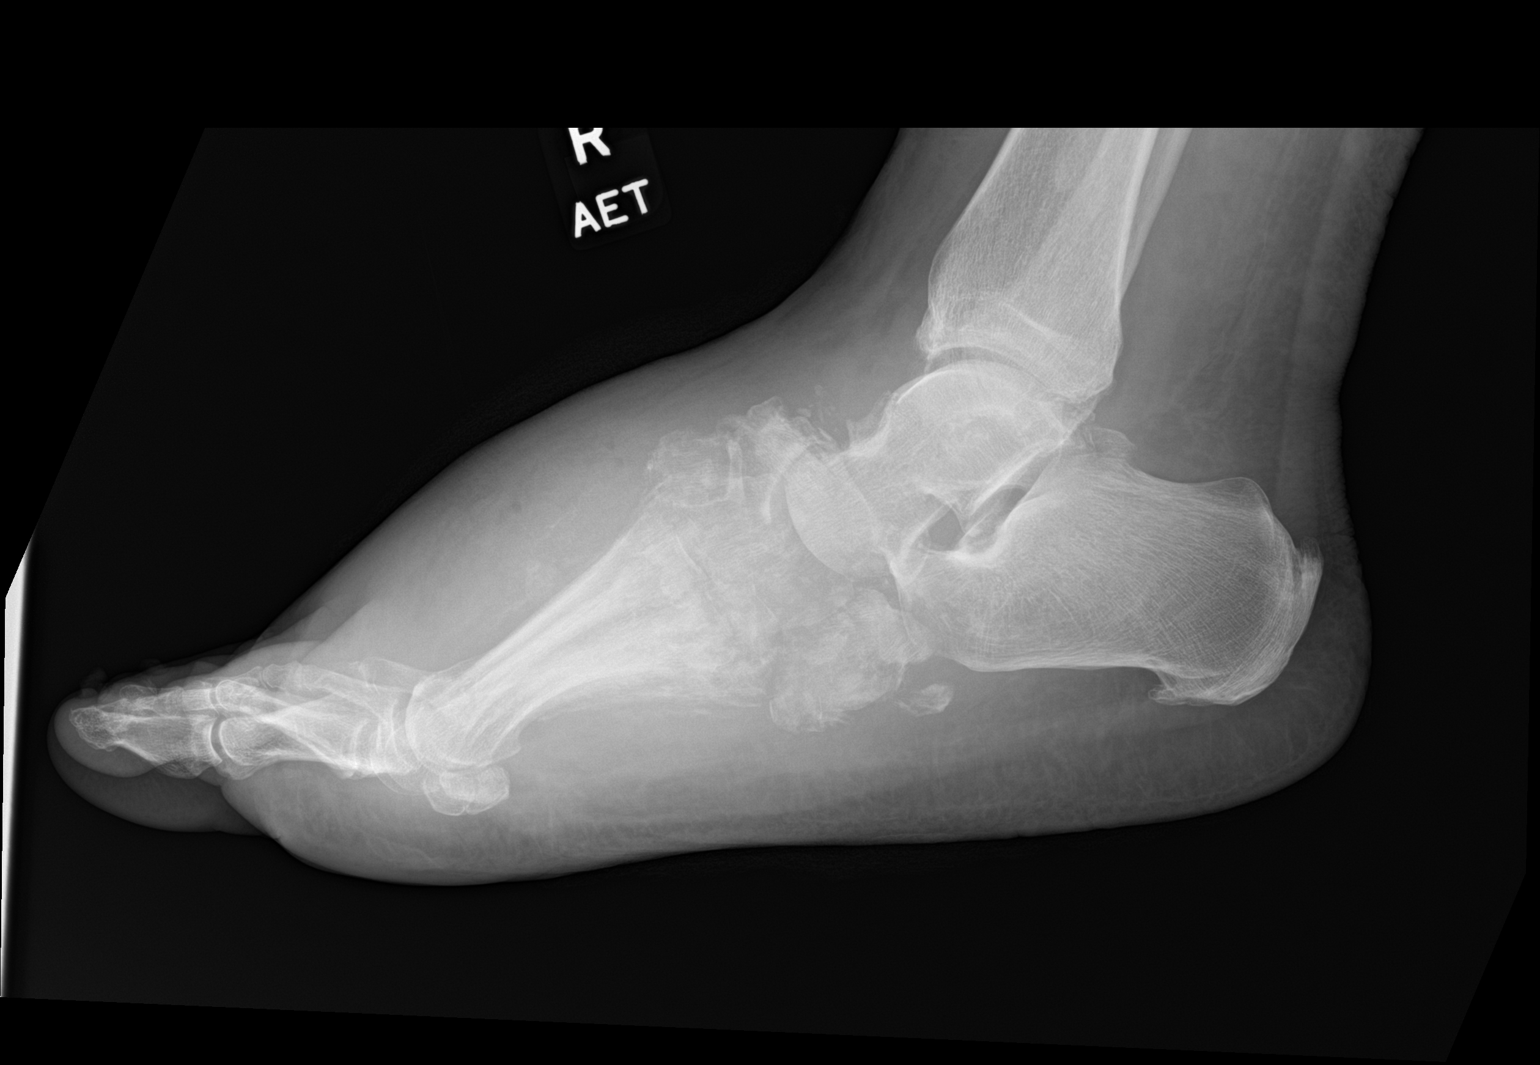

[3 of 3 positions shown; findings below may reference images not displayed]

FINDINGS: Severe bony destructive findings involving the navicular and
cuneiform bones with fragmentation and indistinctness of the
structures. Mild to moderate demineralization and destructive
findings in the cuboid. Substantial erosions and indistinct
destructive findings involving the bases of the second through fifth
metatarsals with extensive sclerosis and periostitis especially in
the fourth and fifth metatarsals compatible with osteomyelitis.
Large erosions involving the head of the fifth metatarsal and the
base of the proximal phalanx fifth toe. Speckled calcifications in
the soft tissues. Severe dorsal soft tissue swelling potentially
with tiny locules of gas in the soft tissues. Notable soft tissue
swelling above the talar head.

Plantar and Achilles calcaneal spurs.
IMPRESSION: 1. Extensive bony destructive findings in the midfoot and Lisfranc
joint, as well as along the fifth MTP joint, likely mostly from
osteomyelitis and multiple confluent septic joints, although a
component of Charcot arthropathy is not excluded.
2. Marked soft tissue swelling along the dorsum of the foot, similar
findings on the MRI from 12/02/2020 were due to abscess and
subcutaneous edema. There is potentially some minimal gas loculation
along the dorsal soft tissues of the foot.

## 2023-08-29 IMAGING — CR DG CHEST 2V
2 series · 2 of 2 positions shown · non-contrast
Comparison: None.

CLINICAL DATA: Weakness

EXAM:
CHEST - 2 VIEW

[chest lat]
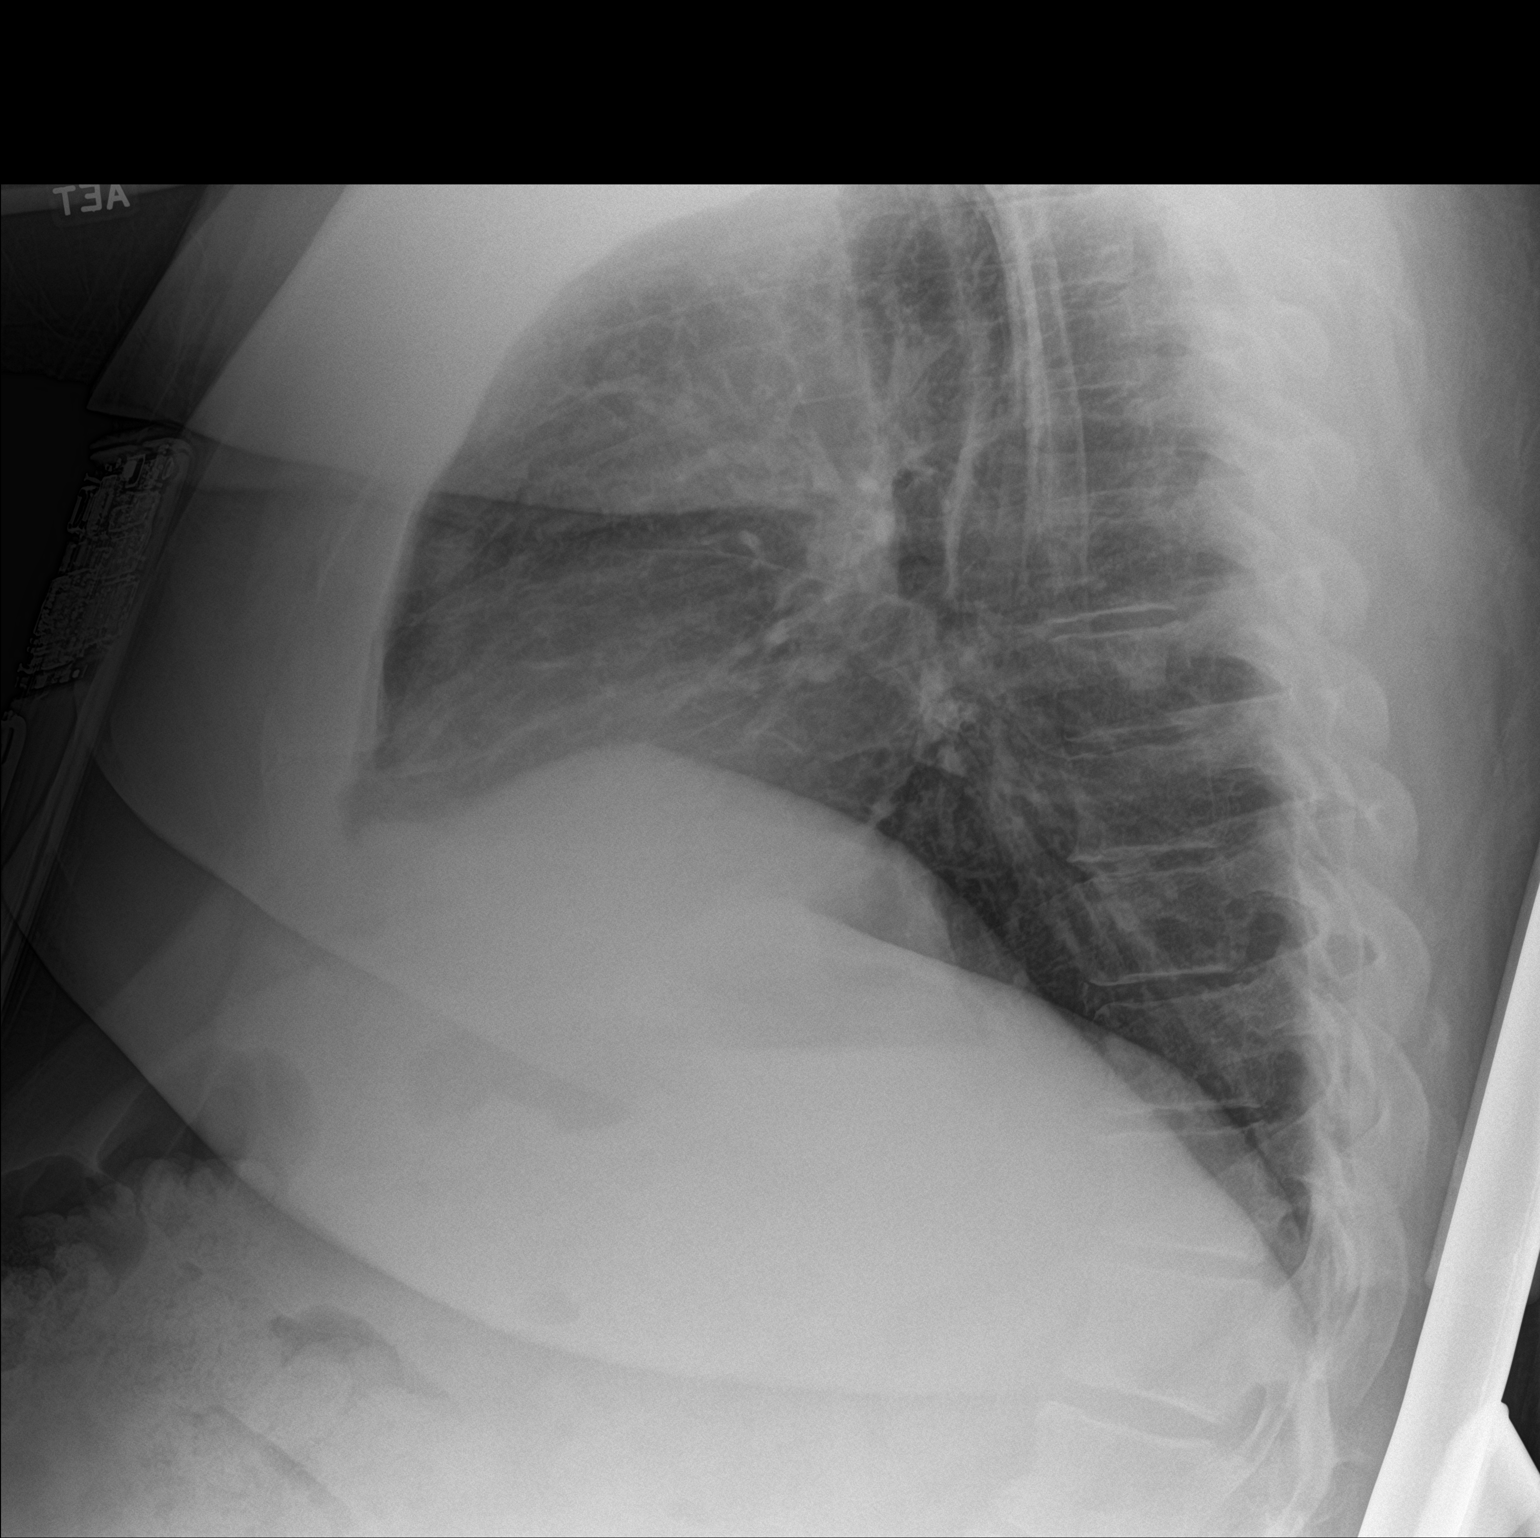

[chest ap]
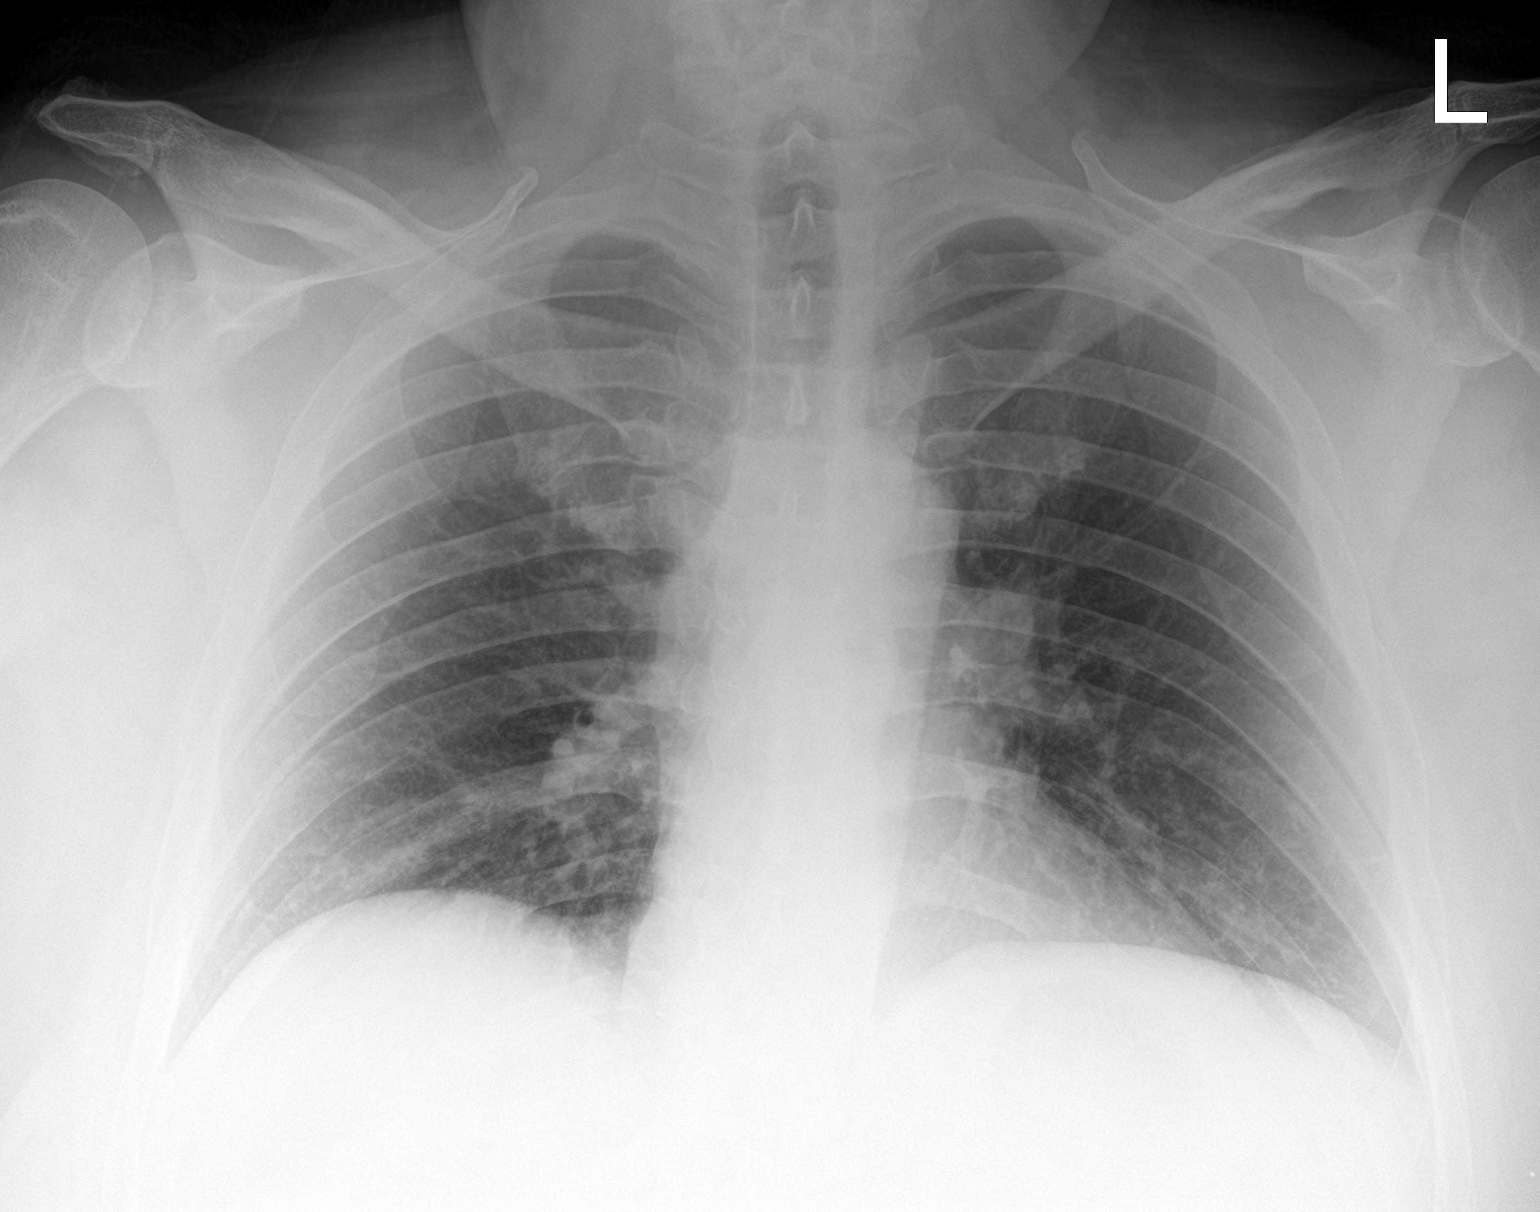

[2 of 2 positions shown; findings below may reference images not displayed]

FINDINGS: The heart size and mediastinal contours are within normal limits.
Both lungs are clear. The visualized skeletal structures are
unremarkable.
IMPRESSION: No active cardiopulmonary disease.

## 2023-09-02 ENCOUNTER — Other Ambulatory Visit: Payer: Self-pay | Admitting: Primary Care

## 2023-09-02 DIAGNOSIS — I10 Essential (primary) hypertension: Secondary | ICD-10-CM

## 2023-09-06 ENCOUNTER — Telehealth: Payer: Self-pay

## 2023-09-06 NOTE — Telephone Encounter (Signed)
 Received patient assistance medications for patient.  Tresiba  12 boxes  Medications have been placed in the refrigerator and labeled with patient information  and Patient has been informed can pick up medications in our office during normal business hours.

## 2023-09-09 NOTE — Telephone Encounter (Signed)
 Pt's wife came in and picked up medications.

## 2023-10-04 ENCOUNTER — Other Ambulatory Visit: Payer: Self-pay | Admitting: Pharmacist

## 2023-10-04 DIAGNOSIS — E118 Type 2 diabetes mellitus with unspecified complications: Secondary | ICD-10-CM

## 2023-10-04 MED ORDER — NOVOLOG FLEXPEN 100 UNIT/ML ~~LOC~~ SOPN: PEN_INJECTOR | SUBCUTANEOUS | 0 refills | Status: AC

## 2023-10-04 NOTE — Progress Notes (Signed)
 Brief Telephone Documentation Reason for Call: Patient left message regarding question for pharmacist   Summary of Call: Patient reports he just opened his last Novolog  pen this week. Was getting supply from [facility], though no shipment of Novolog  has been received in recent months.   [facility] has continued to send Ozempic , pen needles and Tresiba , though no Novolog .   Reorder form filled out and uploaded to PCP's eFax folder for review/signature. Clinical pool cc'd. May fax directly to [facility] upon signature.  Discussed Novo Immediate Supply voucher with patient, sent details via MyChart. He cannot recall if he used the Immediate supply program this year or not. May not work if already used.  Patient states he will call today. This will require a 1 month Rx sent to local pharmacy. Patient preference = [facility]. X1 month Novolog  refill Pended to PCP.   Follow Up: Patient given direct line for further questions/concerns or issues getting his voucher processed at the pharmacy   [name], PharmD Clinical Pharmacist [facility] [phone]

## 2023-10-04 NOTE — Patient Instructions (Addendum)
 Mr. Kenneth Cohen,   It was a pleasure to speak with you today! As we discussed:?   If you have not already used the Novo Immediate Supply program, this would offer you 1 month of free Novo insulin  (including Novolog ) at your local pharmacy.   [facility] Immediate Supply If you are at risk of rationing your insulin  and have an immediate need, you may be eligible for a one-time offer for a free, short-term supply of Novo Nordisk insulin  (30 day supply).  If you are enrolled in a Medicare Part D prescription drug plan Please call [phone] to complete your enrollment.   Non-Medicare Patients: Complete the form below. [email or web address]://[email or web address].com/diabetes/help-[email or web address]-[email or web address]-[email or web address]-insulin -costs/immediate-supply.[email or web address]   Please reach out prior to your next scheduled appointment should you have any questions or concerns.  Thank you!   No future appointments.  [name], PharmD Clinical Pharmacist [facility] [phone]

## 2023-10-11 ENCOUNTER — Other Ambulatory Visit: Payer: Self-pay | Admitting: Primary Care

## 2023-10-11 ENCOUNTER — Encounter: Payer: Self-pay | Admitting: Pharmacist

## 2023-10-11 DIAGNOSIS — I10 Essential (primary) hypertension: Secondary | ICD-10-CM

## 2023-10-11 NOTE — Progress Notes (Signed)
 Brief Telephone Documentation Reason for Call: Patient left message regarding question for pharmacist  Summary of Call: Returned patient call.   Notes he self-increased Tresiba  from 150 to 160 units nightly and wasn't sure if she should reduce his Novolog . Since increasing his Tresiba  ~1 week ago, has stopped using Novolog , or has used 10 units before meals. No Novolog  use at all the past 3-4 days. Prior to this, had been using the 40 units prior to meals.   Dexcom report extremely inconsistent, especially over the last 3-4 days with no prandial insulin  use, sugars have been very well controlled compared to usual. Patient attributes this to cutting out all sugars though notes he has been doing this gradually and is not a new change.   Overall, reports still eating all the same foods over the past month, just less. Has cut out most sugars (all diet drinks, no longer having desserts). Only sugar is Reece's sticks occassionally.   SMBG: Dexcom G7  NO Novolog , dietary choices possibly improved:    40 units Novolog  before meals last week     Plan: Will resume following patient at more frequent intervals again until prandial dose is stable. Continue Tresiba  U200 160 units at night If consistent dietary patterns, unchanged from those of the last 4 days, okay to use 0-10 units of Novolog  though if having substantial meal with carbs, may trial 20 units rather than previous 40 unit over the next week with close PharmD f/u.   Follow Up: CGM review at end of week

## 2023-10-11 NOTE — Telephone Encounter (Signed)
 Please call patient:  Received refill request for olmesartan  from [facility].  I sent in 1 year supply to his pharmacy on 12/02/2022.  He should have another month at least on his prescription.  Is this not the case?

## 2023-10-12 NOTE — Telephone Encounter (Signed)
 Called and spoke with patient, he states he has one refill left on file with the pharmacy that he plans to pick up this week.

## 2023-10-22 ENCOUNTER — Telehealth: Payer: Self-pay

## 2023-10-22 ENCOUNTER — Telehealth: Payer: Self-pay | Admitting: Primary Care

## 2023-10-22 NOTE — Telephone Encounter (Signed)
 Received patient assistance medications for patient.  Novolog  12 boxes  Medications have been placed in the refrigerator and labeled with patient information  and Patient has been informed can pick up medications in our office during normal business hours.

## 2023-10-22 NOTE — Telephone Encounter (Signed)
 Pt came in to pick up medication on 10/325 at 3:40pm

## 2023-10-25 ENCOUNTER — Telehealth: Payer: Self-pay

## 2023-10-25 NOTE — Telephone Encounter (Signed)
 Gave pt a call,pt is coming up due for reenrollment on AZ&ME Farxiga ,pt did not answer left a HIPAA VM. Will mail out pt protion and faxed provider portion today.

## 2023-11-10 ENCOUNTER — Telehealth: Payer: Self-pay | Admitting: Pharmacist

## 2023-11-10 DIAGNOSIS — E118 Type 2 diabetes mellitus with unspecified complications: Secondary | ICD-10-CM

## 2023-11-10 NOTE — Telephone Encounter (Signed)
 Received a call from pt asking about AZ&ME letter, explain pt has been pre approved for AZ&ME pt can call or go online to enrolled for 2026,pt ask for AZ&ME number and will be calling  today.

## 2023-11-10 NOTE — Telephone Encounter (Signed)
 Received provider portion back on AZ&ME Farxiga.

## 2023-11-10 NOTE — Progress Notes (Signed)
 Brief Telephone Documentation Reason for Call: Insulin .CGM monitoring  Summary of Call: Called patient to follow up on CGM report / insulin  dosing.   Patient reports doing generally well in recent weeks. Had a 1-day bug with low fever though this resolved quickly (he did note sugars this day were a bit higher than his usual, but have returned to normal).   Reports one day this week with a low sugar reading that surprised him. Feels he may have been mildly symptomatic, though has had hypo in the past with worse symptoms . Did not use Novolog  around lunch given he skipped this meal. Denies yard work or other physical activity change. Treated with 2 glucose tablets and regular coke. Notably, this occurred mid-day after skipping lunch and was only a few hours after placing a new sensor. Did not confirm with finger stick. No other low alarms in the past several months.   Notes arthritis has been more bothersome. Takes Goody powder or ibuprofen.   Reports new insurance plan for 2026 (was told it may go into effect next month).  Elected [employer] - Diabetes and Heart Care plan   Patient-Reported Regimen: Tresiba  U200: 160 units once daily Novolog  20 units with meals  SMBG: Dexcom G7     Assessment/Plan: Diabetes: Positive improvement in BG over the past week compared to prior. One instance of possible low. Suspect false low given soon after new sensor placement, no Novolog  use, very uncharacteristic for patient. Discussed w patient that treatment with 3-4 glucose tabs is very reasonable if he is unsure of low accuracy, though ideally can confirm with a finger stick. Discussed snacks through the day if skipping meals.  Continue Tresiba  U200 160 units at night Continue Novolog  20 units with substantial meal containing carbs. 10 units if smaller than normal meal. 0 units if skipping a meal.    Arthritis: Encouraged patient to discuss with PCP at follow up as to keep an eye on  severity over time.  Advised against regular Goody's Power and ibuprofen use per renal dysfunction.  Given isolated problematic joints, recommended Voltaren topical gel given significantly better safety profile/low systemic absorption. Discussed use for arthritis typically takes some time to see full benefit. Use TID regularly for at least a few weeks to see if benefit.    Insurance 2026: Transitioning to [employer] - DM and Heart care plan  Ozempic  and diabetes medications should be $0.  Discussed preferred CGM is Herlene which will replace his Dexcom. He is open to this switch. Should be $0. Will discuss once active coverage.  Patient notes 100 day supply via mail order may be cheaper - believe [facility] will also offer this lower cost. Will evaluate once insurance is active.    Follow Up: CGM review ~2 weeks Patient will reach out if any significant changes in sugar, or if additional lows.

## 2023-11-19 ENCOUNTER — Telehealth: Payer: Self-pay

## 2023-11-19 NOTE — Telephone Encounter (Signed)
 Received patient assistance medications for patient.  Ozempic  1boxes  Medications have been placed in the refrigerator and labeled with patient information  and Patient has been informed can pick up medications in our office during normal business hours.

## 2023-11-24 ENCOUNTER — Other Ambulatory Visit: Payer: Self-pay | Admitting: Pharmacist

## 2023-11-24 DIAGNOSIS — E118 Type 2 diabetes mellitus with unspecified complications: Secondary | ICD-10-CM

## 2023-11-24 MED ORDER — DAPAGLIFLOZIN PROPANEDIOL 10 MG PO TABS: 10.0000 mg | ORAL_TABLET | Freq: Every day | ORAL | 3 refills | Status: AC

## 2023-11-24 NOTE — Progress Notes (Signed)
 Chart Review Reason: Medication Refill  Summary: Fax received from AZ&Me requesting up to date prescription for Farxiga .  Currently taking Farxiga  10 mg daily through Patient assistance.   Refill pended to PCP.

## 2023-11-25 ENCOUNTER — Other Ambulatory Visit: Payer: Self-pay | Admitting: Primary Care

## 2023-11-25 DIAGNOSIS — E118 Type 2 diabetes mellitus with unspecified complications: Secondary | ICD-10-CM

## 2023-11-25 DIAGNOSIS — E785 Hyperlipidemia, unspecified: Secondary | ICD-10-CM

## 2023-11-25 DIAGNOSIS — I10 Essential (primary) hypertension: Secondary | ICD-10-CM

## 2023-11-26 NOTE — Telephone Encounter (Signed)
Patient is due for follow up, this will be required prior to any further refills.  Please schedule, thank you!    

## 2023-12-01 ENCOUNTER — Other Ambulatory Visit: Payer: Self-pay | Admitting: Primary Care

## 2023-12-01 DIAGNOSIS — E785 Hyperlipidemia, unspecified: Secondary | ICD-10-CM

## 2023-12-01 DIAGNOSIS — I10 Essential (primary) hypertension: Secondary | ICD-10-CM

## 2023-12-01 DIAGNOSIS — Z125 Encounter for screening for malignant neoplasm of prostate: Secondary | ICD-10-CM

## 2023-12-01 DIAGNOSIS — E118 Type 2 diabetes mellitus with unspecified complications: Secondary | ICD-10-CM

## 2023-12-02 DIAGNOSIS — E118 Type 2 diabetes mellitus with unspecified complications: Secondary | ICD-10-CM | POA: Diagnosis not present

## 2023-12-02 DIAGNOSIS — Z125 Encounter for screening for malignant neoplasm of prostate: Secondary | ICD-10-CM | POA: Diagnosis not present

## 2023-12-02 DIAGNOSIS — E785 Hyperlipidemia, unspecified: Secondary | ICD-10-CM | POA: Diagnosis not present

## 2023-12-03 ENCOUNTER — Ambulatory Visit: Payer: Self-pay | Admitting: Primary Care

## 2023-12-03 LAB — COMPREHENSIVE METABOLIC PANEL WITH GFR
ALT: 33 IU/L (ref 0–44)
AST: 23 IU/L (ref 0–40)
Albumin: 4.1 g/dL (ref 3.8–4.9)
Alkaline Phosphatase: 67 IU/L (ref 47–123)
BUN/Creatinine Ratio: 21 — ABNORMAL HIGH (ref 9–20)
BUN: 27 mg/dL — ABNORMAL HIGH (ref 6–24)
Bilirubin Total: 0.5 mg/dL (ref 0.0–1.2)
CO2: 20 mmol/L (ref 20–29)
Calcium: 8.9 mg/dL (ref 8.7–10.2)
Chloride: 98 mmol/L (ref 96–106)
Creatinine, Ser: 1.31 mg/dL — ABNORMAL HIGH (ref 0.76–1.27)
Globulin, Total: 2.9 g/dL (ref 1.5–4.5)
Glucose: 151 mg/dL — ABNORMAL HIGH (ref 70–99)
Potassium: 4.4 mmol/L (ref 3.5–5.2)
Sodium: 136 mmol/L (ref 134–144)
Total Protein: 7 g/dL (ref 6.0–8.5)
eGFR: 63 mL/min/1.73 (ref >59)

## 2023-12-03 LAB — HEMOGLOBIN A1C
Est. average glucose Bld gHb Est-mCnc: 131 mg/dL
Hgb A1c MFr Bld: 6.2 % — ABNORMAL HIGH (ref 4.8–5.6)

## 2023-12-03 LAB — LIPID PANEL
Chol/HDL Ratio: 4.9 ratio (ref 0.0–5.0)
Cholesterol, Total: 142 mg/dL (ref 100–199)
HDL: 29 mg/dL — ABNORMAL LOW (ref >39)
LDL Chol Calc (NIH): 88 mg/dL (ref 0–99)
Triglycerides: 141 mg/dL (ref 0–149)
VLDL Cholesterol Cal: 25 mg/dL (ref 5–40)

## 2023-12-03 LAB — PSA: Prostate Specific Ag, Serum: 0.5 ng/mL (ref 0.0–4.0)

## 2023-12-05 DIAGNOSIS — E1165 Type 2 diabetes mellitus with hyperglycemia: Secondary | ICD-10-CM | POA: Diagnosis not present

## 2023-12-05 DIAGNOSIS — Z794 Long term (current) use of insulin: Secondary | ICD-10-CM | POA: Diagnosis not present

## 2023-12-06 NOTE — Telephone Encounter (Signed)
 Called pt and scheduled appt

## 2023-12-14 NOTE — Telephone Encounter (Signed)
 Pt call back explain he has already call AZ&ME is approved for 2026 enrollemnt, on Novo Nordisk (Novolog  and Tresiba ) pt explain new Ins will cover both med no need to do pap, if at a later time he needs it he will call back.

## 2023-12-14 NOTE — Telephone Encounter (Signed)
 Left a HIPAA VM pt has not return call ,pt is due for re-enrollment on AZ&ME (farxiga ) and Novo Nordisk (novolog  and (tresiba )

## 2023-12-25 ENCOUNTER — Other Ambulatory Visit: Payer: Self-pay | Admitting: Primary Care

## 2023-12-25 DIAGNOSIS — I10 Essential (primary) hypertension: Secondary | ICD-10-CM

## 2023-12-28 ENCOUNTER — Telehealth: Payer: Self-pay

## 2023-12-28 NOTE — Telephone Encounter (Signed)
 MyChart message sent to patient asking about Gastro referral that was placed on 02/03/2023

## 2023-12-30 ENCOUNTER — Telehealth (INDEPENDENT_AMBULATORY_CARE_PROVIDER_SITE_OTHER): Payer: Self-pay | Admitting: Primary Care

## 2023-12-30 ENCOUNTER — Encounter: Payer: Self-pay | Admitting: Primary Care

## 2023-12-30 VITALS — BP 145/91 | HR 80 | Temp 97.4°F | Ht 78.0 in | Wt 355.0 lb

## 2023-12-30 DIAGNOSIS — F4322 Adjustment disorder with anxiety: Secondary | ICD-10-CM

## 2023-12-30 DIAGNOSIS — E785 Hyperlipidemia, unspecified: Secondary | ICD-10-CM | POA: Diagnosis not present

## 2023-12-30 DIAGNOSIS — I10 Essential (primary) hypertension: Secondary | ICD-10-CM | POA: Diagnosis not present

## 2023-12-30 DIAGNOSIS — E118 Type 2 diabetes mellitus with unspecified complications: Secondary | ICD-10-CM | POA: Diagnosis not present

## 2023-12-30 DIAGNOSIS — Z7985 Long-term (current) use of injectable non-insulin antidiabetic drugs: Secondary | ICD-10-CM | POA: Diagnosis not present

## 2023-12-30 NOTE — Assessment & Plan Note (Addendum)
 Above goal with home reading, historically not elevated. May be due to missed blood pressure medication.  CMP reviewed from November 2025.  Continue olmesartan  40 mg daily, amlodipine  10 mg daily, hydrochlorothiazide  25 mg daily.

## 2023-12-30 NOTE — Patient Instructions (Signed)
 Keep taking your medication regimen.  Please schedule a follow up visit for 6 months for a diabetes check.  It was a pleasure to see you today!

## 2023-12-30 NOTE — Assessment & Plan Note (Signed)
 Controlled. Reviewed lipid panel from November 2025.  Continue atorvastatin  20 mg daily

## 2023-12-30 NOTE — Assessment & Plan Note (Signed)
 Slightly deteriorated but overall controlled with A1C of 6.2 in November 2025.  Continue Ozempic  2 mg weekly, Farxiga  10 mg daily, glipizide  XL 5 mg daily, Tresiba  160 units daily, NovoLog  20 to 30 units with meals.  Reviewed pharmacy notes from October 2025.  Follow-up in 6 months.

## 2023-12-30 NOTE — Assessment & Plan Note (Signed)
 No concerns today. Continue to monitor.

## 2023-12-30 NOTE — Progress Notes (Signed)
 Patient ID: LEDELL CODRINGTON, male    DOB: [birth year], 58 y.o.   MRN: [id number]  Virtual visit completed through [employer]ility, a video enabled telemedicine application. Due to national recommendations of social distancing due to COVID-19, a virtual visit is felt to be most appropriate for this patient at this time. Reviewed limitations, risks, security and privacy concerns of performing a virtual visit and the availability of in person appointments. I also reviewed that there may be a patient responsible charge related to this service. The patient agreed to proceed.   Patient location: home Provider location: [place] at [facility], office Persons participating in this virtual visit: patient, provider   If any vitals were documented, they were collected by patient at home unless specified below.    BP (!) 145/91   Pulse 80   Temp (!) 97.4 F (36.3 C)   Ht 6' 6 (1.981 m)   Wt (!) 355 lb (161 kg)   BMI 41.02 kg/m    CC: Follow Up Subjective:   HPI: Kenneth Cohen is a 58 y.o. male with a history of hypertension, type 2 diabetes, neuropathy, CKD, hyperlipidemia, anxiety and depression, left BKA, right BKA presenting on 12/30/2023 for Medical Management of Chronic Issues (Follow up.)   1) Hypertension/Hyperlipidemia : Currently managed on amlodipine  10 mg daily, hydrochlorothiazide  25 mg daily, and olmesartan  40 mg daily.  He is not checking his blood pressure at home. Today his BP was 145/91. He did run out of one his blood pressure medications for about 1 week, recently resumed.   BP Readings from Last 3 Encounters:  12/30/23 (!) 145/91  07/20/23 112/68  04/06/23 108/60     Also managed on atorvastatin  20 mg daily.  His last LDL was 88 in November 2025.  He denies chest pain, dizziness, headaches.   2) Type 2 Diabetes:  Current medications include: Ozempic  2 mg weekly, Tresiba  160 units daily, Novolog  20-30 units depending on his meal, glipizide  XL 5 mg daily, Farxiga  10 mg  daily  He is checking his blood glucose continuously with CGM. His glucose levels have been higher than usual as he's working to cut back on nicotine. He's seeing mid 200s to low 300s.   He denies nausea, GI upset.   Last A1C: 6.2 in November 2025 Last Eye Exam: Up-to-date Last Foot Exam: N/A Pneumonia Vaccination: 2025 Urine Microalbumin: Statin: Atorvastatin   Dietary changes since last visit: Working to cut back on nicotine which has increased his intake of sweets and junk food.    Exercise: None  3) Anxiety and Depression: Not currently on treatment. Overall feels well managed off treatment.      Relevant past medical, surgical, family and social history reviewed and updated as indicated. Interim medical history since our last visit reviewed. Allergies and medications reviewed and updated. Outpatient Medications Prior to Visit  Medication Sig Dispense Refill   amLODipine  (NORVASC ) 10 MG tablet TAKE 1 TABLET BY MOUTH EVERY DAY FOR BLOOD PRESSURE 90 tablet 0   atorvastatin  (LIPITOR) 20 MG tablet TAKE 1 TABLET BY MOUTH EVERY DAY FOR CHOLESTEROL 90 tablet 0   dapagliflozin  propanediol (FARXIGA ) 10 MG TABS tablet Take 1 tablet (10 mg total) by mouth daily before breakfast. for diabetes. 90 tablet 3   glipiZIDE  (GLUCOTROL  XL) 5 MG 24 hr tablet TAKE 1 TABLET BY MOUTH EVERY DAY WITH BREAKFAST FOR DIABETES 90 tablet 0   hydrochlorothiazide  (HYDRODIURIL ) 25 MG tablet TAKE 1 TABLET BY MOUTH EVERY DAY FOR BLOOD PRESSURE  90 tablet 0   insulin  aspart (NOVOLOG  FLEXPEN) 100 UNIT/ML FlexPen Inject up to 50 units sq TIDAC as instructed. MDD = 150 (Patient taking differently: Inject 20 Units into the skin with breakfast, with lunch, and with evening meal. Novo PAP) 45 mL 0   insulin  degludec (TRESIBA  FLEXTOUCH) 200 UNIT/ML FlexTouch Pen Inject 150 units sq once daily. Please bill primary: BIN: [id number],  PCN: [id number],  GRP: [id number],  ID: [id number] (Patient taking differently: Inject 160 Units into the  skin daily. Novo PAP) 27 mL 0   Insulin  Pen Needle (RELION MINI PEN NEEDLES) 31G X 6 MM MISC Use three times daily with insulin  360 each 3   olmesartan  (BENICAR ) 40 MG tablet TAKE 1 TABLET BY MOUTH EVERY DAY FOR BLOOD PRESSURE 90 tablet 0   Semaglutide , 2 MG/DOSE, 8 MG/3ML SOPN Inject 2 mg as directed once a week. for diabetes.     Facility-Administered Medications Prior to Visit  Medication Dose Route Frequency Provider Last Rate Last Admin   mupirocin  cream (BACTROBAN ) 2 %   Topical BID Hyatt, Max T, DPM         Per HPI unless specifically indicated in ROS section below Review of Systems  Respiratory:  Negative for shortness of breath.   Cardiovascular:  Negative for chest pain.  Neurological:  Negative for dizziness and headaches.  Psychiatric/Behavioral:  The patient is not nervous/anxious.    Objective:  BP (!) 145/91   Pulse 80   Temp (!) 97.4 F (36.3 C)   Ht 6' 6 (1.981 m)   Wt (!) 355 lb (161 kg)   BMI 41.02 kg/m   Wt Readings from Last 3 Encounters:  12/30/23 (!) 355 lb (161 kg)  07/20/23 (!) 355 lb (161 kg)  04/06/23 (!) 363 lb (164.7 kg)       Physical exam: General: Alert and oriented x 3, no distress, does not appear sickly  Pulmonary: Speaks in complete sentences without increased work of breathing, no cough during visit.  Psychiatric: Normal mood, thought content, and behavior.     Results for orders placed or performed in visit on 12/01/23  Hemoglobin A1c   Collection Time: 12/02/23  9:59 AM  Result Value Ref Range   Hgb A1c MFr Bld 6.2 (H) 4.8 - 5.6 %   Est. average glucose Bld gHb Est-mCnc 131 mg/dL  Comprehensive metabolic panel with GFR   Collection Time: 12/02/23  9:59 AM  Result Value Ref Range   Glucose 151 (H) 70 - 99 mg/dL   BUN 27 (H) 6 - 24 mg/dL   Creatinine, Ser 8.68 (H) 0.76 - 1.27 mg/dL   eGFR 63 >40 fO/fpw/8.26   BUN/Creatinine Ratio 21 (H) 9 - 20   Sodium 136 134 - 144 mmol/L   Potassium 4.4 3.5 - 5.2 mmol/L   Chloride 98 96  - 106 mmol/L   CO2 20 20 - 29 mmol/L   Calcium  8.9 8.7 - 10.2 mg/dL   Total Protein 7.0 6.0 - 8.5 g/dL   Albumin 4.1 3.8 - 4.9 g/dL   Globulin, Total 2.9 1.5 - 4.5 g/dL   Bilirubin Total 0.5 0.0 - 1.2 mg/dL   Alkaline Phosphatase 67 47 - 123 IU/L   AST 23 0 - 40 IU/L   ALT 33 0 - 44 IU/L  Lipid panel   Collection Time: 12/02/23  9:59 AM  Result Value Ref Range   Cholesterol, Total 142 100 - 199 mg/dL   Triglycerides 858 0 - 149 mg/dL  HDL 29 (L) >39 mg/dL   VLDL Cholesterol Cal 25 5 - 40 mg/dL   LDL Chol Calc (NIH) 88 0 - 99 mg/dL   Chol/HDL Ratio 4.9 0.0 - 5.0 ratio  PSA   Collection Time: 12/02/23  9:59 AM  Result Value Ref Range   Prostate Specific Ag, Serum 0.5 0.0 - 4.0 ng/mL   Assessment & Plan:   Problem List Items Addressed This Visit       Cardiovascular and Mediastinum   Essential hypertension - Primary   Above goal with home reading, historically not elevated. May be due to missed blood pressure medication.  CMP reviewed from November 2025.  Continue olmesartan  40 mg daily, amlodipine  10 mg daily, hydrochlorothiazide  25 mg daily.        Endocrine   Diabetes mellitus type 2 with complications (HCC)   Slightly deteriorated but overall controlled with A1C of 6.2 in November 2025.  Continue Ozempic  2 mg weekly, Farxiga  10 mg daily, glipizide  XL 5 mg daily, Tresiba  160 units daily, NovoLog  20 to 30 units with meals.  Reviewed pharmacy notes from October 2025.  Follow-up in 6 months.        Other   Hyperlipidemia   Controlled.  Reviewed lipid panel from November 2025 Continue atorvastatin  20 mg daily.      Adjustment disorder with anxiety   No concerns today.  Continue to monitor.        No orders of the defined types were placed in this encounter.  No orders of the defined types were placed in this encounter.   I discussed the assessment and treatment plan with the patient. The patient was provided an opportunity to ask questions and all  were answered. The patient agreed with the plan and demonstrated an understanding of the instructions. The patient was advised to call back or seek an in-person evaluation if the symptoms worsen or if the condition fails to improve as anticipated.  Follow up plan:  Keep taking your medication regimen.  Please schedule a follow up visit for 6 months for a diabetes check.  It was a pleasure to see you today!   Emileigh Kellett K Iniko Robles, NP

## 2024-01-04 ENCOUNTER — Ambulatory Visit: Payer: Self-pay | Admitting: Primary Care

## 2024-01-26 ENCOUNTER — Other Ambulatory Visit

## 2024-02-08 DIAGNOSIS — E118 Type 2 diabetes mellitus with unspecified complications: Secondary | ICD-10-CM

## 2024-02-09 MED ORDER — SEMAGLUTIDE (2 MG/DOSE) 8 MG/3ML ~~LOC~~ SOPN: 2.0000 mg | PEN_INJECTOR | SUBCUTANEOUS | 1 refills | Status: AC

## 2024-02-10 ENCOUNTER — Other Ambulatory Visit (HOSPITAL_COMMUNITY): Payer: Self-pay

## 2024-02-10 ENCOUNTER — Telehealth: Payer: Self-pay

## 2024-02-10 NOTE — Telephone Encounter (Signed)
 Pharmacy Patient Advocate Encounter   Received notification from [facility] KEY that prior authorization for Ozempic  8 is required/requested.   Insurance verification completed.   The patient is insured through [employer].   Per test claim: PA required; PA submitted to above mentioned insurance via Latent Key/confirmation #/EOC [facility] Status is pending

## 2024-02-14 ENCOUNTER — Other Ambulatory Visit (HOSPITAL_COMMUNITY): Payer: Self-pay

## 2024-02-18 ENCOUNTER — Other Ambulatory Visit (HOSPITAL_COMMUNITY): Payer: Self-pay

## 2024-02-18 NOTE — Telephone Encounter (Signed)
 Pharmacy Patient Advocate Encounter  Received notification from [employer] that Prior Authorization for Ozempic  8 has been APPROVED from 02/10/24 to 02/09/25. Unable to obtain price due to refill too soon rejection, last fill date 02/10/24 next available fill date2/12/26   PA #/Case ID/Reference #: # [id number]

## 2024-02-18 NOTE — Telephone Encounter (Signed)
 Called and spoke with patient.  Pt has received ozempic  and has no questions or concerns.

## 2024-02-23 DIAGNOSIS — E785 Hyperlipidemia, unspecified: Secondary | ICD-10-CM

## 2024-02-23 DIAGNOSIS — I10 Essential (primary) hypertension: Secondary | ICD-10-CM

## 2024-02-23 MED ORDER — HYDROCHLOROTHIAZIDE 25 MG PO TABS: 25.0000 mg | ORAL_TABLET | Freq: Every day | ORAL | 1 refills | Status: AC

## 2024-02-23 MED ORDER — ATORVASTATIN CALCIUM 20 MG PO TABS: 20.0000 mg | ORAL_TABLET | Freq: Every day | ORAL | 1 refills | Status: AC

## 2024-07-13 ENCOUNTER — Ambulatory Visit: Admitting: Primary Care
